# Patient Record
Sex: Female | Born: 1960 | Race: White | Hispanic: No | Marital: Single | Smoking: Current every day smoker
Health system: Southern US, Community
[De-identification: ages and names within clinical notes are randomized; demographics above are authoritative.]

## PROBLEM LIST (undated history)

## (undated) DIAGNOSIS — F32A Depression, unspecified: Secondary | ICD-10-CM

## (undated) DIAGNOSIS — M797 Fibromyalgia: Secondary | ICD-10-CM

## (undated) DIAGNOSIS — I10 Essential (primary) hypertension: Secondary | ICD-10-CM

## (undated) DIAGNOSIS — G4730 Sleep apnea, unspecified: Secondary | ICD-10-CM

## (undated) DIAGNOSIS — M549 Dorsalgia, unspecified: Secondary | ICD-10-CM

## (undated) DIAGNOSIS — K589 Irritable bowel syndrome without diarrhea: Secondary | ICD-10-CM

## (undated) DIAGNOSIS — E039 Hypothyroidism, unspecified: Secondary | ICD-10-CM

## (undated) DIAGNOSIS — M1990 Unspecified osteoarthritis, unspecified site: Secondary | ICD-10-CM

## (undated) DIAGNOSIS — F329 Major depressive disorder, single episode, unspecified: Secondary | ICD-10-CM

## (undated) DIAGNOSIS — E669 Obesity, unspecified: Secondary | ICD-10-CM

## (undated) DIAGNOSIS — G43909 Migraine, unspecified, not intractable, without status migrainosus: Secondary | ICD-10-CM

## (undated) DIAGNOSIS — K219 Gastro-esophageal reflux disease without esophagitis: Secondary | ICD-10-CM

## (undated) HISTORY — PX: BACK SURGERY: SHX140

## (undated) HISTORY — PX: NASAL SEPTUM SURGERY: SHX37

## (undated) HISTORY — DX: Obesity, unspecified: E66.9

## (undated) HISTORY — DX: Irritable bowel syndrome, unspecified: K58.9

## (undated) HISTORY — PX: NECK SURGERY: SHX720

## (undated) HISTORY — DX: Migraine, unspecified, not intractable, without status migrainosus: G43.909

## (undated) HISTORY — DX: Sleep apnea, unspecified: G47.30

## (undated) HISTORY — DX: Unspecified osteoarthritis, unspecified site: M19.90

## (undated) HISTORY — DX: Gastro-esophageal reflux disease without esophagitis: K21.9

## (undated) HISTORY — PX: TOTAL ABDOMINAL HYSTERECTOMY: SHX209

## (undated) HISTORY — DX: Fibromyalgia: M79.7

## (undated) HISTORY — DX: Dorsalgia, unspecified: M54.9

## (undated) HISTORY — PX: BREAST BIOPSY: SHX20

## (undated) HISTORY — PX: HERNIA REPAIR: SHX51

## (undated) HISTORY — DX: Hypothyroidism, unspecified: E03.9

## (undated) HISTORY — PX: CHOLECYSTECTOMY: SHX55

---

## 2006-10-15 ENCOUNTER — Ambulatory Visit: Payer: Self-pay | Admitting: Internal Medicine

## 2006-10-15 ENCOUNTER — Inpatient Hospital Stay (HOSPITAL_COMMUNITY): Admission: EM | Admit: 2006-10-15 | Discharge: 2006-10-29 | Payer: Self-pay | Admitting: Internal Medicine

## 2006-10-15 ENCOUNTER — Ambulatory Visit: Payer: Self-pay | Admitting: Cardiovascular Disease

## 2006-10-29 ENCOUNTER — Ambulatory Visit: Payer: Self-pay | Admitting: Gastroenterology

## 2006-11-22 ENCOUNTER — Inpatient Hospital Stay (HOSPITAL_COMMUNITY): Admission: EM | Admit: 2006-11-22 | Discharge: 2006-11-26 | Payer: Self-pay | Admitting: Emergency Medicine

## 2007-09-10 ENCOUNTER — Other Ambulatory Visit: Admission: RE | Admit: 2007-09-10 | Discharge: 2007-09-10 | Payer: Self-pay | Admitting: Orthopaedic Surgery

## 2007-09-12 IMAGING — CT CT ABDOMEN W/ CM
2 of 5 series · 16 of 46 positions shown, 18 images · IV contrast (APPLIED)
Comparison: 10/16/2006

ABDOMEN CT WITH CONTRAST:

CLINICAL DATA: Abdominal pain. Nausea vomiting and diarrhea. Gallbladder surgery
in August 2006.
TECHNIQUE: Multidetector CT imaging of the abdomen and pelvis was performed
following the standard protocol during bolus administration of intravenous
contrast.

Contrast:  100 cc Omnipaque 300

[Series 2: abd/pelv with 5.0 b31f st · axial · 0.92mm/px · z∈[-418,-38]mm · 13 of 86 slices shown, 15 images]
[im 5/86  soft-tissue]
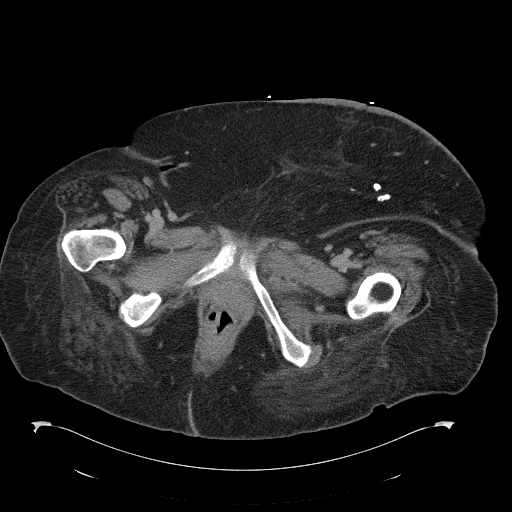
[im 5/86  bone]
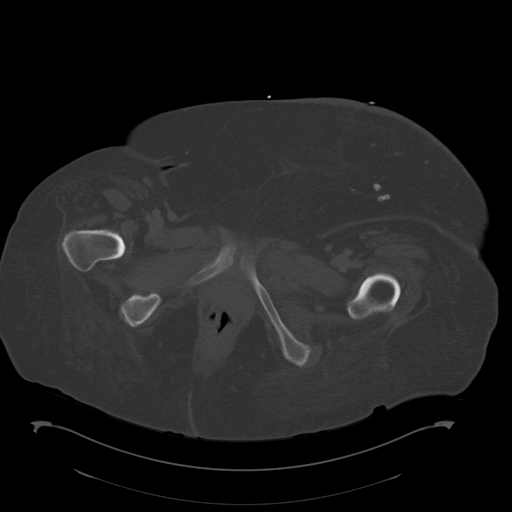
[im 10/86  soft-tissue]
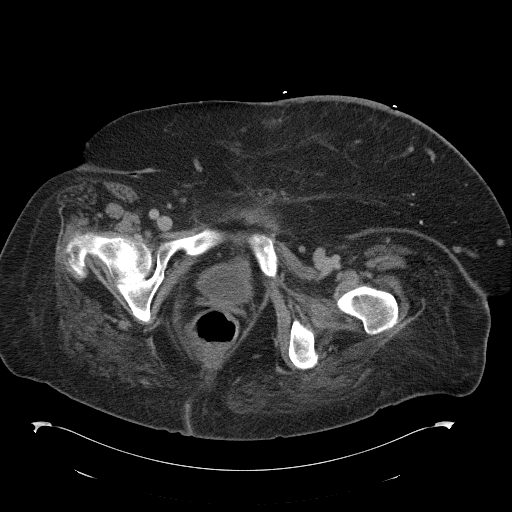
[im 19/86  soft-tissue]
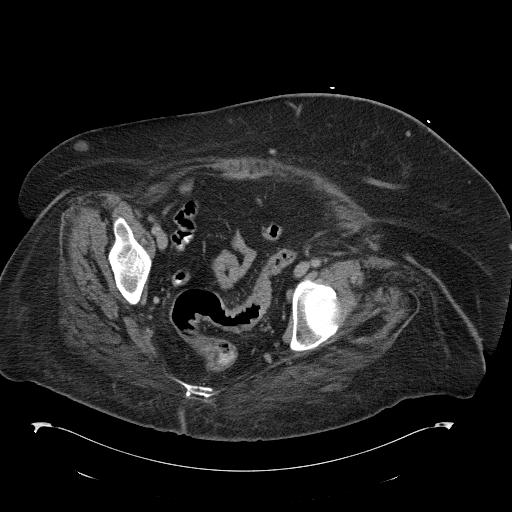
[im 24/86  soft-tissue]
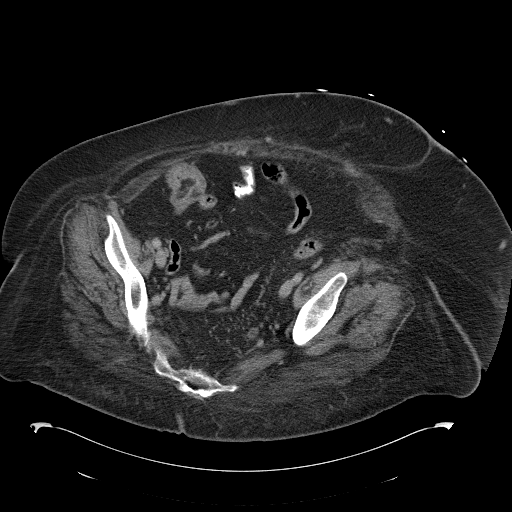
[im 29/86  soft-tissue]
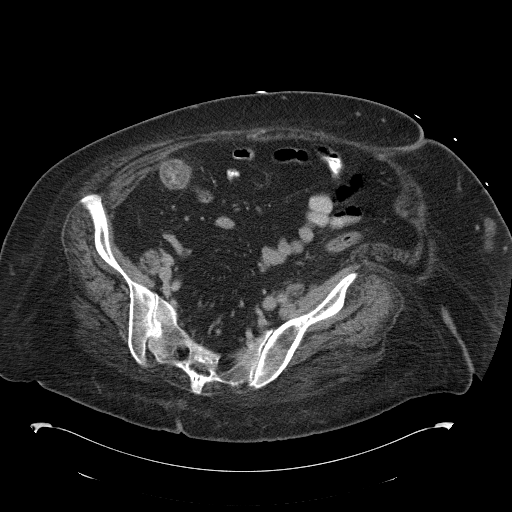
[im 38/86  soft-tissue]
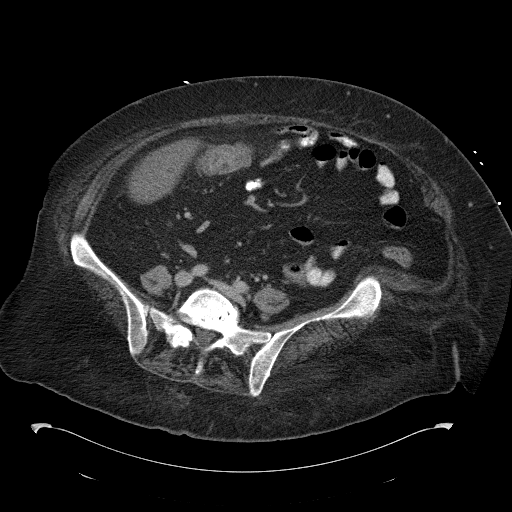
[im 43/86  soft-tissue]
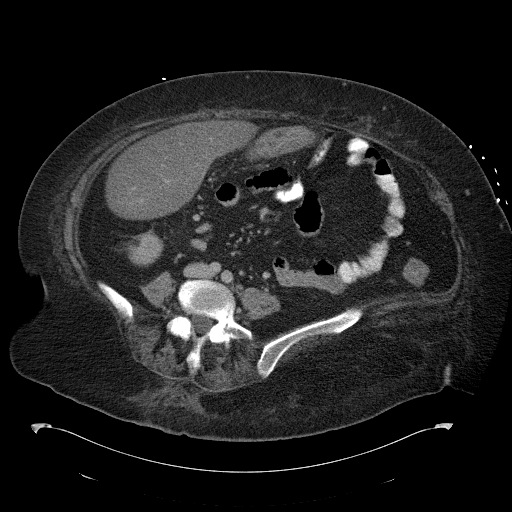
[im 48/86  soft-tissue]
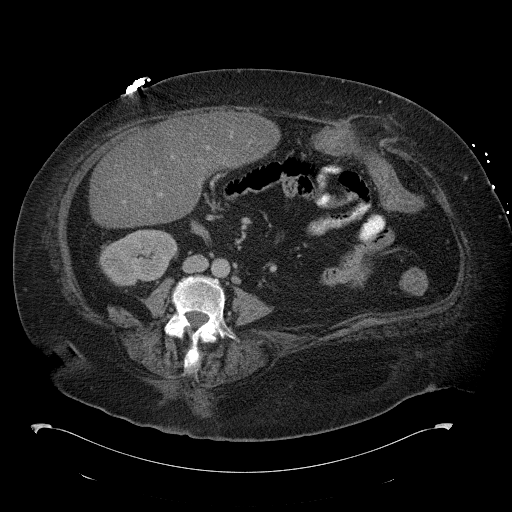
[im 57/86  soft-tissue]
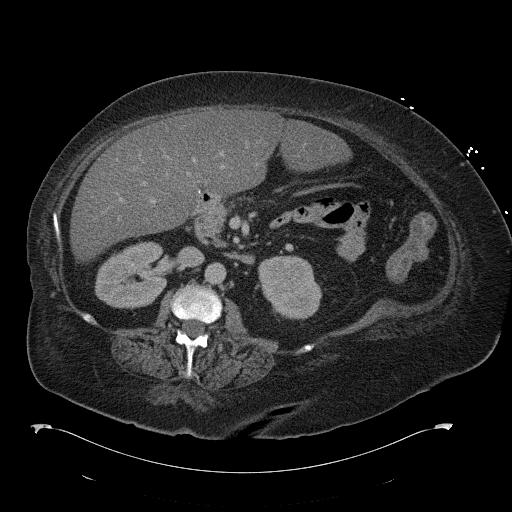
[im 57/86  bone]
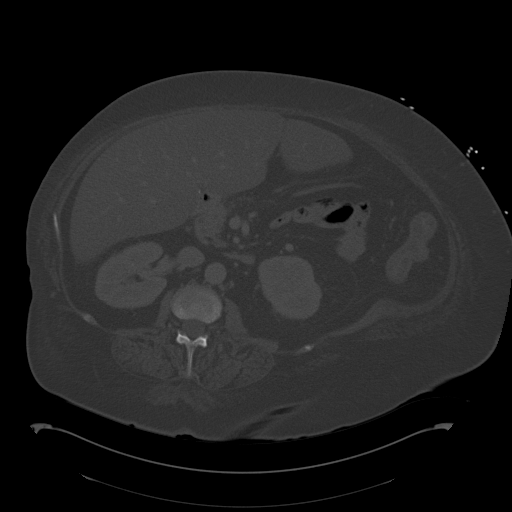
[im 62/86  soft-tissue]
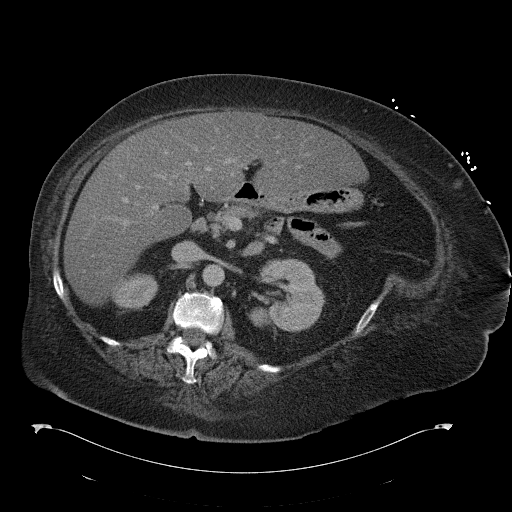
[im 67/86  soft-tissue]
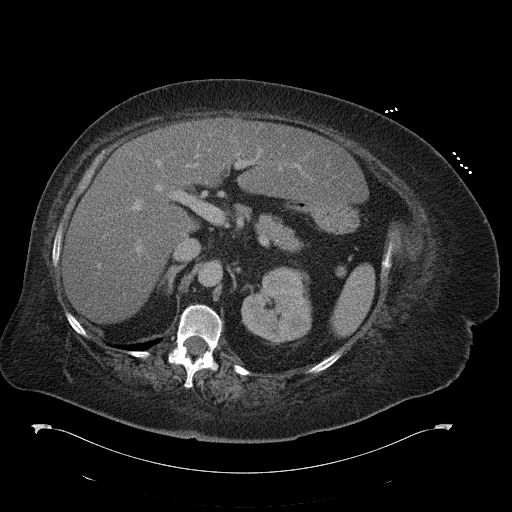
[im 76/86  soft-tissue]
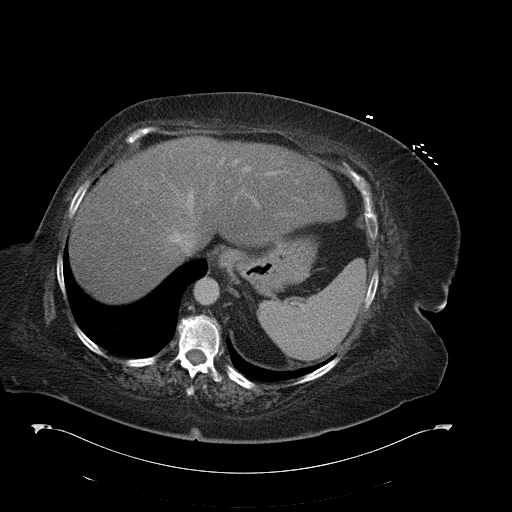
[im 81/86  soft-tissue]
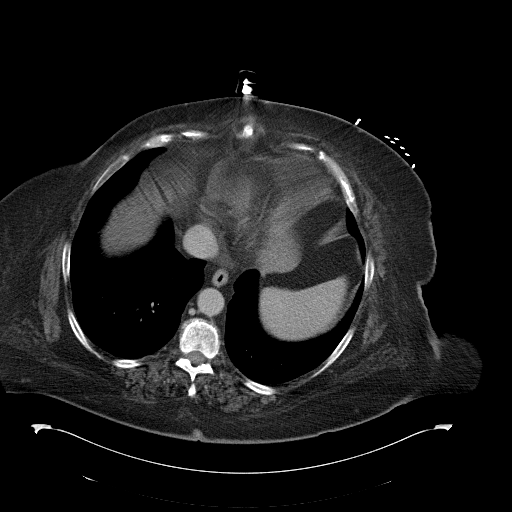

[Series 602: coronal a/p · coronal · 0.92mm/px · 3 of 176 slices shown]
[im 59/176  soft-tissue]
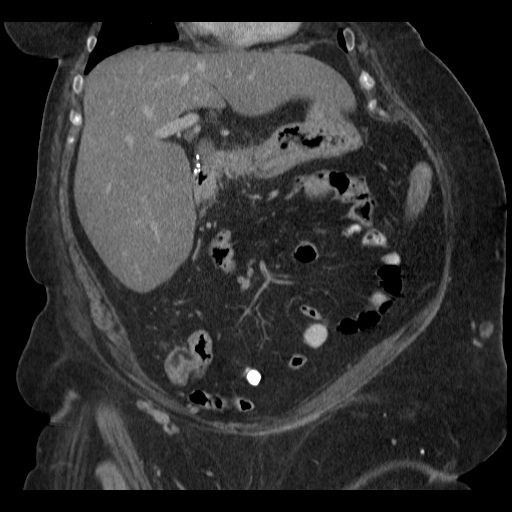
[im 78/176  soft-tissue]
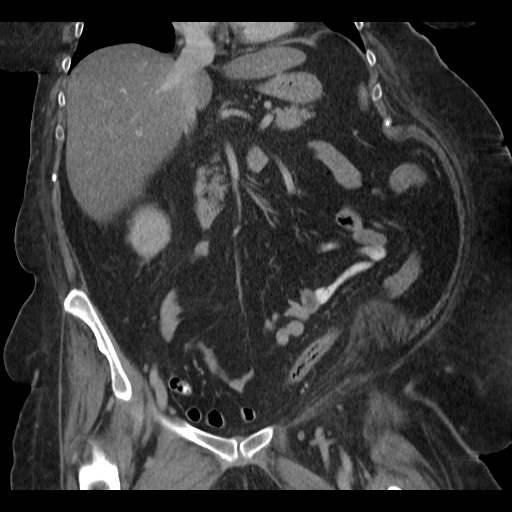
[im 98/176  soft-tissue]
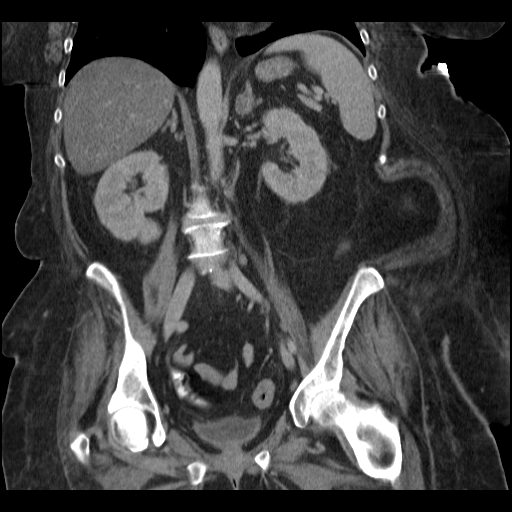

[16 of 46 positions shown; findings below may reference images not displayed]

FINDINGS: The liver is diffusely fatty infiltrated. The spleen, stomach,
duodenum, pancreas, and kidneys are unremarkable. Bilateral adrenal nodules are
stable in the interval. The left adrenal nodule has imaging features consistent
with a benign adrenal adenoma. The right adrenal nodule is quite small cannot be
further characterized, but is unchanged the interval. This is also most likely
an adenoma.

Ventral fascia is lax. A 3.7 cm defect is identified in the ventral fascia which
is at the midline based on the location of the umbilicus. The hernia contains
only omental fat without evidence for edema or fluid within the small hernia sac
to suggest infarction or incarceration. The transverse colon passes just deep to
the fascial defect, but does not extend into the hernia sac.

There is no bowel dilation suggest obstruction. There is no intraperitoneal free
fluid. No abdominal lymphadenopathy. No abdominal aortic aneurysm.
IMPRESSION: Hernia contains only omental fat without evidence for incarceration.

Stable bilateral adrenal nodules most consistent with benign adrenal adenomas.

Diffuse fatty infiltration of the liver.

PELVIS CT WITH CONTRAST:
FINDINGS: No intraperitoneal free fluid. The uterus is surgically absent.
Neither ovary is visualized and there is no evidence for an adnexal mass.

The colon is decompressed throughout, but does appear to have some wall
thickening extending from the cecum to the midsigmoid segment. Think there is
some subtle pericolonic edema/inflammation as well. The terminal ileum is
normal. The appendix has normal features.
IMPRESSION: Decompressed colon with diffuse wall thickening and mild pericolonic
edema/inflammation. Given the long segment involvement, an infectious or
inflammatory colitis is favored.

## 2009-10-04 ENCOUNTER — Inpatient Hospital Stay (HOSPITAL_COMMUNITY): Admission: RE | Admit: 2009-10-04 | Discharge: 2009-10-11 | Payer: Self-pay | Admitting: Neurosurgery

## 2010-06-18 LAB — BASIC METABOLIC PANEL
BUN: 11 mg/dL (ref 6–23)
Chloride: 110 mEq/L (ref 96–112)
Creatinine, Ser: 0.7 mg/dL (ref 0.4–1.2)
Glucose, Bld: 87 mg/dL (ref 70–99)

## 2010-06-18 LAB — GLUCOSE, CAPILLARY
Glucose-Capillary: 105 mg/dL — ABNORMAL HIGH (ref 70–99)
Glucose-Capillary: 106 mg/dL — ABNORMAL HIGH (ref 70–99)
Glucose-Capillary: 108 mg/dL — ABNORMAL HIGH (ref 70–99)
Glucose-Capillary: 109 mg/dL — ABNORMAL HIGH (ref 70–99)
Glucose-Capillary: 115 mg/dL — ABNORMAL HIGH (ref 70–99)
Glucose-Capillary: 121 mg/dL — ABNORMAL HIGH (ref 70–99)
Glucose-Capillary: 125 mg/dL — ABNORMAL HIGH (ref 70–99)
Glucose-Capillary: 126 mg/dL — ABNORMAL HIGH (ref 70–99)
Glucose-Capillary: 127 mg/dL — ABNORMAL HIGH (ref 70–99)
Glucose-Capillary: 144 mg/dL — ABNORMAL HIGH (ref 70–99)
Glucose-Capillary: 153 mg/dL — ABNORMAL HIGH (ref 70–99)
Glucose-Capillary: 178 mg/dL — ABNORMAL HIGH (ref 70–99)
Glucose-Capillary: 92 mg/dL (ref 70–99)

## 2010-06-18 LAB — CBC
MCH: 29.5 pg (ref 26.0–34.0)
MCHC: 33.4 g/dL (ref 30.0–36.0)
MCV: 88.6 fL (ref 78.0–100.0)
Platelets: 270 10*3/uL (ref 150–400)
RDW: 14.9 % (ref 11.5–15.5)
WBC: 11.1 10*3/uL — ABNORMAL HIGH (ref 4.0–10.5)

## 2010-06-18 LAB — APTT: aPTT: 33 seconds (ref 24–37)

## 2010-06-18 LAB — URINALYSIS, ROUTINE W REFLEX MICROSCOPIC
Bilirubin Urine: NEGATIVE
Glucose, UA: NEGATIVE mg/dL
Hgb urine dipstick: NEGATIVE
Ketones, ur: NEGATIVE mg/dL
pH: 7.5 (ref 5.0–8.0)

## 2010-06-18 LAB — SURGICAL PCR SCREEN: MRSA, PCR: NEGATIVE

## 2010-08-09 ENCOUNTER — Other Ambulatory Visit: Payer: Self-pay | Admitting: Medical

## 2010-08-15 NOTE — Discharge Summary (Signed)
[name], [name]               ACCOUNT NO.:  [id number]   MEDICAL RECORD NO.:  [id number]          PATIENT TYPE:  INP   LOCATION:  4743                         FACILITY:  [facility]   PHYSICIAN:  Madaline Savage, MD        DATE OF BIRTH:  1961/01/17   DATE OF ADMISSION:  11/21/2006  DATE OF DISCHARGE:  11/26/2006                               DISCHARGE SUMMARY   PRIMARY CARE PHYSICIAN:  Dr. Wyvonnia Lora, [place], [place].  This  patient is unassigned to [place].   DISCHARGE DIAGNOSES:  1. Colitis, likely infectious colitis, which has resolved.  2. Citrobacter urinary tract infection.  3. Chronic pain syndrome.  4. History of obstructive sleep apnea.  5. Depression.  6. Hypothyroidism.  7. Morbid obesity.  8. Anxiety disorder.   DISCHARGE MEDICATIONS:  1. Ciprofloxacin 500 mg twice daily for 9 more days.  2. Flagyl 500 mg three times daily for 9 more days.  3. Percocet 5/325 one tablet three times daily as needed.  4. Phenergan 25 mg three times daily as needed.  5. Synthroid 175 mcg once daily.  6. Citalopram 20 mcg once daily.  7. Flora-Q one tablet daily.  8. Zolpidem 5 mg at bedtime.  9. Insulin, sliding scale as before.   HISTORY OF PRESENT ILLNESS:  For full history and physical, please see  the history and physical dictated by Dr. Eda Paschal on November 22, 2006.  In  short, [name] is a 50 year old lady with a history of chronic pain  syndrome, sleep apnea, depression, hypothyroidism who was admitted with  intractable nausea and vomiting.  She was recently admitted with a  diagnosis of small bowel obstruction and non-incarcerated ventral hernia  with no surgical intervention needed and at that time her symptoms had  resolved with medical treatment.  She has now come back with complaints  of nausea and abdominal pain and a CT scan obtained in the emergency  department showed colitis and she was admitted for further evaluation  and management.   PROCEDURES DONE IN THE  HOSPITAL:  1. She had a CT scan of abdomen and pelvis done on November 22, 2006      which showed decompressed colon with a diffuse wall thickening and      a mild pericolonic edema/inflammation.  Given the long segment      involvement an infectious or inflammatory colitis is favored.  It      also showed a ventral hernia which contains only omental fat      without evidence of incarceration.  2. She had an acute abdominal series x-ray done on November 21, 2006      which showed slightly prominent left abdominal wall small bowel      loops which are less prominent than on prior study.   PROBLEM LIST:  1. Colitis, likely infectious colitis.  This is a 50 year old lady who      had laparoscopic cholecystectomy recently after which she was      admitted to the hospital for small bowel obstruction.  She has now  come back with abdominal pain and her CT scan shows colitis and the      CT scan features suggested infectious versus inflammatory colitis.      She did not have any blood in her stools.  She had diarrhea, nausea      and vomiting.  She was started empirically on Rocephin and Flagyl      while in the hospital with good improvement in the next 48 hours.      At this time her abdominal pain has completely resolved.  Her      nausea is well controlled on Phenergan and she feels that she will      do well if she goes home.  I suspect this is an infectious colitis,      since she responded very well to antibiotics.  She will also need a      colonoscopy as an outpatient once she stabilizes.  I have asked her      to follow up with her primary care doctor in one week, who can      arrange for that.  The plan would be to continue her with      ciprofloxacin and Flagyl for a total of two weeks of antibiotics.  2. Citrobacter urinary tract infection.  After admission she was found      to be having a urinary tract infection with Citrobacter.  The      cultures grew greater than 100,000  colonies.  This is sensitive to      Rocephin and ciprofloxacin.  I will plan to treat her with      ciprofloxacin for a total of two weeks.   DISPOSITION:  She is now being discharged home in a stable condition.  She is asked to complete her two week course of antibiotics.  She is  also asked to follow up with her primary care doctor in one week.  I  would recommend that she be arranged for an outpatient colonoscopy  once  she stabilizes.   FOLLOWUP:  She is asked to follow up with her primary care doctor, Dr.  Margo Common, in one week.      Madaline Savage, MD  Electronically Signed     PKN/MEDQ  D:  11/26/2006  T:  11/26/2006  Job:  [id number]   cc:   Wyvonnia Lora

## 2010-08-15 NOTE — H&P (Signed)
[name], [name]               ACCOUNT NO.:  [id number]   MEDICAL RECORD NO.:  [id number]          PATIENT TYPE:  INP   LOCATION:  4743                         FACILITY:  [facility]   PHYSICIAN:  Hind I Elsaid, MD      DATE OF BIRTH:  1960-09-13   DATE OF ADMISSION:  11/22/2006  DATE OF DISCHARGE:                              HISTORY & PHYSICAL   PRIMARY CARE PHYSICIAN:  Unassigned to [place].   CHIEF COMPLAINT:  Intractable nausea and vomiting.   HISTORY OF PRESENT ILLNESS:  This is a 50 year old female who has a  recent admission to [facility] with a diagnosis of small-bowel  obstruction, which has resolved medically.  History of recent  laparoscopic cholecystectomy approximately around 2-3 months ago where  it seems like the patient with a history of diarrhea.  History of  nonincarcerated ventral hernia.  The patient was recently discharged  from the hospital with a diagnosis of small-bowel obstruction and  nonincarcerated ventral hernia with no surgical intervention needed.  The patient, today, with a history of intractable nausea and vomiting  since the date of discharge.  The patient admitted she continued to have  complaints of nausea and vomiting of whitish-to-yellow fluid which  contained food particles, which is progressively getting worse and  reached to the point where the patient cannot keep any food in.  Condition also associated with 60-pound weight loss since the date of  the surgery.  Condition also associated with mid abdominal pain, mainly  around the paraumbilicus, around 8 in severity, localized.  Not  associated with fever but the condition associated with diarrhea.  The  patient admitted those symptoms all started after the lap  cholecystectomy.  The patient admitted to diarrhea almost 4-10 times a  day, watery in nature, was not associated with blood.  According to the  previous history, the patient was recently diagnosed on July 28 with a  diagnosis of  small-bowel obstruction, which is resolved medically and  diarrhea which has remained chronically.  The patient denies any  headache, denies any shortness of breath, denies any chest pain, denies  any numbness or weakness of extremities.   PAST MEDICAL HISTORY:  1. Status post small-bowel obstruction, resolved medically.  2. Shock due to systemic inflammatory response syndrome.  3. Lap cholecystectomy approximately 3 months ago for diarrhea, which      seems chronic.  4. History of nonincarcerated ventral hernia.  5. Obstructive sleep apnea with obesity hypoventilation syndrome.  6. Chronic pain.  7. Depression and debilitation.  8. Hypothyroidism.  9. Morbid obesity.  10.Anxiety disorder.   PAST SURGICAL HISTORY:  1. Lap cholecystectomy as above.  2. Hysterectomy.  3. Tonsillectomy.  4. Nose reconstruction.  5. As was mentioned, gallbladder and appendectomy.  6. History of herniated disk.   ALLERGIES:  CLINDAMYCIN, NAPROSYN, AND NONSTEROIDAL ANTI-INFLAMMATORY  DRUGS.   MEDICATIONS:  1. Synthroid 175 mcg p.o. daily.  2. Citalopram 20 mg p.o. daily.  3. Flora-Q1 tab p.o. daily.  4. Zolpidem 5 mg p.o. daily.  5. Insulin sliding scale.   SOCIAL HISTORY:  She  lives with her mother, single, no children.  No  smoking and no alcoholism.   PHYSICAL EXAMINATION:  GENERAL:  The patient was crying in the middle of  the conversation, and she is kind of concerned about her consistent  nausea and vomiting.  She is anxious, crying, obese lady.  VITAL SIGNS:  Temperature 97.9, blood pressure 131/76, pulse rate 95,  respiratory rate 20, saturation 97% on room air.  HEENT:  Normocephalic, atraumatic.  Extraocular muscle movements normal.  Pupils equal, reactive to light and accommodation.  NECK:  Has double chin.  Neck is really short.  Cannot evaluate JVD.  LUNGS:  Clear to auscultation.  HEART:  Regular rate and rhythm.  ABDOMEN:  Very obese.  Laparoscopic cholecystectomy incision  has healed.  Difficult to examine any palpable masses.  No evidence of hernia.  There  is tenderness mainly at the paraumbilical area, where there is evidence  of some surgical scars.  There is no evidence of rebound tenderness or  guarding.  Bowel sounds were positive.  EXTREMITIES:  Some stasis dermatitis in her bilateral ankles.   X-ray which shows slightly prominent left abdominal small-bowel loop  found on prior surgery 1 month ago.  No free air.  Diffuse interstitial  prominence compatible with chronic interstitial lung disease.  Lipase  131.  CMP:  Sodium 139, potassium 2.8, chloride 106, CO2 of 25, glucose  95, BUN is 2, and creatinine 0.79, bilirubin 0.07, alkaline phosphatase  83, AST 31, ALT 16, total protein 6.4, albumin 2.6, and calcium 8.5.  Urinalysis:  White blood cells 11-20, RBCs 3-6, small leukocytes, and BA  6.5, and small bilirubin, ketones 15.  CBC:  Hemoglobin 12.2, hematocrit  36.5, platelets 522, and there is some neutrophil percent 58, lymphocyte  absolute 4.3 which is kind of mildly elevated.   ASSESSMENT AND PLAN:  1. Intractable nausea and vomiting in a lady with a history of      nonincarcerated ventral hernia.  Abdominal examination:  Some      tenderness around the paraumbilicus area.  Cannot exclude      incarcerated hernia.  Cannot exclude pancreatic stone or biliary      stone, although the liver function test is within normal, but      amylase is mildly elevated.  We will keep the patient n.p.o. at      this time.  We will get CT abdomen and pelvis with IV and p.o.      contrast for evaluation of incarcerated hernia.  We will consider      consulting surgery if there is any abnormality with CT scan.  We      will continue with Protonix IV at this time and placement of      nasogastric tube, placement of nasogastric tube depending on the CT      scan findings if there is any evidence of ileus.  We will repeat      amylase and lipase for further  evaluation.  2. Hyperkalemia secondary to excessive vomiting, which could      exacerbate chronic ileus or small-bowel obstruction.  We will plan      to correct potassium within the next 24 hours, most probably      hyperkalemia secondary to excessive vomiting.  3. Diarrhea:  Cause is really unclear.  We will get a Clostridium      difficile and white blood cells and ova and parasite.  Possibility      of malabsorption  secondary to removal of the gallbladder.  We will      consider consulting gastroenterology if needed.  We will consider      Imodium for treatment of diarrhea if above negative.  4. Hypothyroidism:  We will continue with Synthroid.  5. Depression:  To continue with citalopram, which has remained      stable.  6. Deep venous thrombosis and gastrointestinal prophylaxis.      Hind Bosie Helper, MD  Electronically Signed     HIE/MEDQ  D:  11/22/2006  T:  11/22/2006  Job:  [id number]

## 2010-08-15 NOTE — Discharge Summary (Signed)
NAMEREISE, [name]               ACCOUNT NO.:  [id number]   MEDICAL RECORD NO.:  [id number]          PATIENT TYPE:  INP   LOCATION:  5527                         FACILITY:  [facility]   PHYSICIAN:  Nelda Bucks, MD DATE OF BIRTH:  [birth year]   DATE OF ADMISSION:  10/15/2006  DATE OF DISCHARGE:  10/29/2006                               DISCHARGE SUMMARY   DATE OF ANTICIPATED DISCHARGE:  10/29/2006.   DISCHARGE DIAGNOSES:  1. Status post small bowel obstruction (resolved with medical      management)  2. Shock, systemic inflammatory response syndrome (resolved)  3. Recent laparoscopic cholecystectomy approximately 6 weeks ago  4. Diarrhea  5. Acute renal failure (resolved)]  6. Non-incarcerated ventral hernia  7. Probable obstructive sleep apnea with obesity-hypoventilation      syndrome  8. Chronic pain  9. Depression  10.Debility   CONSULTATIONS:  1. Dr. Violeta Gelinas, general surgery  2. Dr. Melvia Heaps, [place] [facility]   PROCEDURES:  1. Flexible sigmoidoscopy obtained on 10/24/2006 that demonstrated      normal descending colon and rectal exam.  2. Right internal jugular vein central venous catheter placed      10/15/2006, removed 10/27/2006.  3. Radial A-line placed 10/16/2006, removed 10/17/2006 on the right.  4. Nasogastric tube placed 10/16/2006, removed 10/20/2006   LABORATORY DATA:  10/28/2006:  Sodium 143, potassium 3.8, chloride 113,  CO2 23, glucose 70, BUN 3, creatinine 1.26, calcium 8.2.  10/26/2006:  White blood cell count 11.2, hemoglobin 8.4, hematocrit 25, platelet  count 455.   RADIOLOGY:  1. Flat plate of abdomen obtained on 10/22/2006 demonstrating mildly      distended loops of the small bowel.  2. CT abdomen and pelvis obtained on 10/16/2006 demonstrating findings      consistent with partial small bowel obstruction.   HOSPITAL COURSE BY DISCHARGE DIAGNOSIS:  1. Status post small bowel obstruction.  Ms. [name] is a 50 year old      white  female whom the critical care team accepted from [facility], who apparently had laparoscopic cholecystectomy      approximately 2 weeks prior to admission at [facility].  She      had had intermittent nausea and vomiting on and off over the weeks      following.  CT abdomen and pelvis demonstrated concern for early      bowel obstruction.  She was sent to [facility] for      potential laparoscopic repair with the surgical service team.  She      developed progressive renal failure and hypotension, as well as      acute delirium.  The pulmonary critical care service admitted as      primary care providers.  Surgical consultation was obtained by Dr.      Violeta Gelinas.  Recommendations were to place NG tube, provide      empiric antibiotics, and provide bowel rest.  This was done.  There      was no evidence of perforation or peritonitis.  [name] was able  to successful resolve signs and symptoms of small bowel obstruction      without surgical intervention, and upon time of discharge is      currently able to tolerate oral diet without difficulty.  She does      complain of intermittent episodes of nausea still at time of      discharge.  2. Shock, systemic inflammatory response syndrome.  This is resolved.      This is thought to be both to some degree a component of      hypovolemia, as well as perhaps an inflammatory response to the      bowel obstruction.  She was treated aggressively with IV fluids.      She did require Levophed for maintenance of mean arterial pressure.      She did have resultant acute renal failure from this.  This has      since resolved with aggressive critical care services shortly after      critical care admission time.  Upon time of discharge, [name] is      hemodynamically stable, without evidence of multiple organ      dysfunction.  3. Status post recent lap chole.  This was completed over 2 months ago      at  [facility].  There have been no sequelae from this.  She will      require surgical followup at some point in the future and perhaps      followup with gastroenterology at the discretion of the accepting      primary care [place].  4. Diarrhea.  This occurred approximately 5 days prior to discharge.      There was some concern as to whether or not this could represent      ischemia given transient episode of hypotension prior to this.      Because of this, gastroenterology was consulted.  She underwent      flexible sigmoidoscopy which demonstrated normal findings of the      colon and the rectum.  She was started on both Questran and      Florastor.  Currently on time of discharge and for the last 72      hours, she has had no more episodes of diarrhea.  The Questran will      be discontinued.  She will continue Florastor on a regular      scheduled dose, with followup as indicated.  5. Acute renal failure.  This resolved with aggressive IV fluid      resuscitation.  Her creatinine was as high as 5.5.  Upon time of      discharge it is currently 1.26.  This is slightly elevated from lab      obtained on 10/26/2006 for which her creatinine was down to 1.15.      This is following discontinuation of IV fluids.  She has been      instructed to increase oral fluid intake.  She will require      occasional laboratory followup of renal function.  6. Non-incarcerated ventral hernia.  This will require observation in      the outpatient setting.  7. Probable obstructive sleep apnea and obesity-hypoventilation      syndrome.  [name] is morbidly obese.  She clearly has the      anatomy for obstructive sleep apnea.  It would be an important      routine health maintenance recommendation for elective  polysomnogram evaluation for sleep apnea in the outpatient setting.  8. Chronic pain.  Plan for this is to continue current regimen as to      be outlined.  9. Depression.  Plan for this is  to treat medically, could consider      psychiatric evaluation in the outpatient setting if indicated.  10.Anemia of chronic disease.  Currently no signs or symptoms of      bleeding.  Her hemoglobin has been stable in the 8.2 to 8.1 range.      She was found to be normocytic and normochromic on differential      count.   DISCHARGE MEDICATIONS:  1. Celexa 20 mg p.o. daily  2. Lovenox 85 mg subcutaneous daily  3. Duragesic patch 50 mcg patch q.72h  4. Sliding scale insulin for blood glucose 201-250 equals 2 units, for      251-300 equals 3 units, from 301 to 350 equals 4 units, greater      than 350 equals 5 units and call physician.  (this for her h.s. insulin needs).  1. Sliding scale insulin NovoLog before meals for blood glucose 101-      150 equals 2 units, for 151-200 equals 3 units, 201-250 equals 5      units, 251-300 equals 7 units, 301 to 350 equals 9 units, greater      than 350 equals 11 units and call physician.  2. Synthroid 175 mcg p.o. daily  3. Protonix 40 mg p.o. daily  4. Florastor 250 mg cap 1 p.o. daily  5. Percocet 2 tabs q.6h p.r.n. pain  6. Promethazine 12.5 mg p.o. q.6h nausea   DIET:  As tolerated.  Currently on regular diet.   ACTIVITY:  Increase as tolerated.   DISPOSITION:  Ms. Schnieders currently still marked debility and weakness  following prolonged acute illness.  She will require rehabilitation  services in the skilled nursing home setting in order to regain  functional independence.  She is medically cleared for discharge to  skilled nursing facility setting with outpatient followup.      Zenia Resides, NP      Nelda Bucks, MD  Electronically Signed    PB/MEDQ  D:  10/28/2006  T:  10/28/2006  Job:  [id number]

## 2010-08-15 NOTE — Consult Note (Signed)
[name], [name]               ACCOUNT NO.:  [id number]   MEDICAL RECORD NO.:  [id number]          PATIENT TYPE:  INP   LOCATION:  2110                         FACILITY:  [facility]   PHYSICIAN:  Gabrielle Dare. Janee Morn, M.D.DATE OF BIRTH:  1960/07/19   DATE OF CONSULTATION:  DATE OF DISCHARGE:                                 CONSULTATION   REPORT TYPE:  Surgical consultation.   CHIEF COMPLAINT:  Of possible bowel obstruction from hernia.   HISTORY OF PRESENT ILLNESS:  I was asked to evaluate this 50 year old  white female by Dr. Tyson Alias from critical care medicine.  She was  accepted in transfer from [facility].  The patient  apparently had a laparoscopic cholecystectomy 2 months ago at [facility].  She has had nausea and vomiting on and off over the weeks following.  The patient was admitted to a week or 2 ago for urinary tract infection.  She developed a recurrence of the nausea and vomiting and was readmitted  to [facility] on 07/14 for further evaluation.  CT scan of the  abdomen and pelvis apparently at that time according to her report shows  a left-sided spigelian hernia with possible early small bowel  obstruction.  No signs of bowel compromise are noted on the report.  Surgical consult was obtained there and recommendation was made for  transfer to a Medical Center for laparoscopic repair of this hernia.  In  the interim the patient has worsened clinically and developed acute  renal failure.  She was transferred here.  The patient is confused.  She  is unable to assist much with her history   PAST MEDICAL HISTORY:  Morbid obesity, anxiety disorder, asthma,  hypothyroidism and a recent admission for urinary tract infection.   PAST SURGICAL HISTORY:  Laparoscopic cholecystectomy as above, otherwise  unknown.   ALLERGIES:  CLINDAMYCIN, NAPROSYN and NSAIDS.   MEDICATIONS:  Please see the MARCH.   REVIEW OF SYSTEMS:  She is not cooperative with review of  systems but  does complain of some generalized body pain, nausea and vomiting.   PHYSICAL EXAMINATION:  On physical exam temperature 98.3, blood pressure  135/59, heart rate  127, respirations 18, saturations 98%  GENERAL:  She is anxious.  She does answer some questions.  NECK is supple.  LUNGS are clear to auscultation.  HEART is tachycardiac but regular.  ABDOMEN is very obese.  Laparoscopic cholecystectomy incisions are  healed.  She has no obvious palpable mass or hernia.  She does have some  diffuse mild tenderness with no peritonitis.  The patient's extreme  obesity significantly clouds her examination.  She has some skin  breakdown under her pannus.  EXTREMITIES:  Have some stasis changes in bilateral ankles.   DATA REVIEWED:  Abdominal x-ray shows no obvious obstruction, but it is  very poor quality secondary to her size.  White blood cell count 13,000,  hemoglobin 9, BUN 36, creatinine 5.6, glucose 79, lactate 1.6.   IMPRESSION:  1. Possible small bowel obstruction secondary to spigelian hernia.  2. Acute renal failure.  3. Lactate within normal limits  with acidosis likely secondary to #2.   RECOMMENDATIONS:  NG tube, IV antibiotics and a repeat of her CT scan of  the abdomen and pelvis to further evaluate.  Again, she has no  peritonitis on her examination.  I discussed this in full with Dr. Clelia Croft,  the resident on the [facility]  service will follow closely.      Gabrielle Dare Janee Morn, M.D.  Electronically Signed     BET/MEDQ  D:  10/16/2006  T:  10/16/2006  Job:  [id number]   cc:   Nelda Bucks, MD

## 2010-08-29 ENCOUNTER — Encounter: Payer: Self-pay | Admitting: Cardiology

## 2010-08-29 DIAGNOSIS — R072 Precordial pain: Secondary | ICD-10-CM

## 2011-01-02 ENCOUNTER — Other Ambulatory Visit (INDEPENDENT_AMBULATORY_CARE_PROVIDER_SITE_OTHER): Payer: Self-pay | Admitting: Internal Medicine

## 2011-01-02 DIAGNOSIS — K219 Gastro-esophageal reflux disease without esophagitis: Secondary | ICD-10-CM

## 2011-01-04 ENCOUNTER — Ambulatory Visit (INDEPENDENT_AMBULATORY_CARE_PROVIDER_SITE_OTHER): Payer: Medicare Other | Admitting: Internal Medicine

## 2011-01-04 ENCOUNTER — Encounter (INDEPENDENT_AMBULATORY_CARE_PROVIDER_SITE_OTHER): Payer: Self-pay | Admitting: Internal Medicine

## 2011-01-04 VITALS — BP 132/84 | HR 66 | Temp 97.3°F | Ht 64.0 in | Wt 207.5 lb

## 2011-01-04 DIAGNOSIS — R1314 Dysphagia, pharyngoesophageal phase: Secondary | ICD-10-CM

## 2011-01-04 DIAGNOSIS — R1310 Dysphagia, unspecified: Secondary | ICD-10-CM

## 2011-01-04 NOTE — Progress Notes (Signed)
Subjective:     Patient ID: [name], female   DOB: January 01, 1961, 50 y.o.   MRN: [id number]  HPI   [name] presents today with c/o dysphagia.  She says foods are lodging. Foods are lodging in her upper esophagus.  She says water will even lodge in her esophagus. She has symptoms for about a year. She says this has progressively worsened.  She also c/o epigastric pain.  She is eating one meal a day. She has had about a 40 pound weight loss which has been intentional.  She is trying to exercise daily on a stationary back.  She is morbidly obese.  She c/o nausea today. She is having a BM one a day.      EGDED 12/2006 non erosive gastritis. Duodenal biopsy was negative for celiac. Colonoscopy in 2008 was normal Review of Systems  See hpi Current Outpatient Prescriptions  Medication Sig Dispense Refill  . beta carotene w/minerals (OCUVITE) tablet Take 1 tablet by mouth daily.        . fentaNYL (DURAGESIC - DOSED MCG/HR) 100 MCG/HR Place 1 patch onto the skin every 3 (three) days.        . furosemide (LASIX) 40 MG tablet Take 40 mg by mouth 3 (three) times daily.        . hydrocodone-acetaminophen (LORCET PLUS) 7.5-650 MG per tablet Take 1 tablet by mouth every 6 (six) hours as needed.        Marland Kitchen levothyroxine (SYNTHROID, LEVOTHROID) 175 MCG tablet Take 175 mcg by mouth daily.        . metoprolol tartrate (LOPRESSOR) 25 MG tablet Take 25 mg by mouth daily.        . Milnacipran (SAVELLA) 50 MG TABS Take 50 mg by mouth 2 (two) times daily.        Marland Kitchen NEXIUM 40 MG capsule TAKE 1 CAPSULE BY MOUTH EVERY DAY  30 capsule  11  . polyethylene glycol (MIRALAX / GLYCOLAX) packet Take 17 g by mouth 2 (two) times daily before a meal.        . pregabalin (LYRICA) 75 MG capsule Take 75 mg by mouth 3 (three) times daily.        Marland Kitchen tiZANidine (ZANAFLEX) 4 MG capsule Take 4 mg by mouth 3 (three) times daily.        Marland Kitchen topiramate (TOPAMAX) 100 MG tablet Take 100 mg by mouth 2 (two) times daily.        . traMADol (ULTRAM)  50 MG tablet Take 50 mg by mouth 3 (three) times daily.        Marland Kitchen zolpidem (AMBIEN) 10 MG tablet Take 10 mg by mouth at bedtime as needed.         Past Surgical History  Procedure Date  . Cholecystectomy   . Hernia repair   . Breast biopsy   . Total abdominal hysterectomy     Left oophroectomy for a lare tumor about 21 yrs. RT ovary removed time of hysterectomy      Past Medical History  Diagnosis Date  . IBS (irritable bowel syndrome)   . GERD (gastroesophageal reflux disease)   . Migraines   . Sleep apnea   . Back pain   . Hypothyroid   . Osteoarthritis   . Diabetes mellitus   . Obesity    History   Social History Narrative  . No narrative on file   Allergies  Allergen Reactions  . Keflex  Objective:   Physical Exam  Filed Vitals:   01/04/11 1102  BP: 132/84  Pulse: 66  Temp: 97.3 F (36.3 C)  Height: 5\' 4"  (1.626 m)  Weight: 207 lb 8 oz (94.121 kg)    Alert and oriented. Skin warm and dry. Oral mucosa is moist. Edentulous. . Sclera anicteric, conjunctivae is pink. Thyroid not enlarged. No cervical lymphadenopathy. Lungs clear. Heart regular rate and rhythm.  Abdomen is soft. Bowel sounds are positive. obese No hepatomegaly. No abdominal masses felt. No tenderness   Edema to lower extremities.  Compression hose in place. Patient is alert and oriented.     Assessment:   Dysphagia    Plan:    Barium pill study. Further recommendations once we have the study back. Possible EGD/ED if abnormal. I will discuss this with Dr. Karilyn Cota.  She states she is satisfied with her care today.

## 2011-01-09 ENCOUNTER — Telehealth (INDEPENDENT_AMBULATORY_CARE_PROVIDER_SITE_OTHER): Payer: Self-pay | Admitting: Internal Medicine

## 2011-01-09 ENCOUNTER — Encounter (INDEPENDENT_AMBULATORY_CARE_PROVIDER_SITE_OTHER): Payer: Self-pay | Admitting: *Deleted

## 2011-01-09 NOTE — Telephone Encounter (Signed)
I spoke with patient. She is still having dysphagia. She will need an EGD/ED

## 2011-01-10 ENCOUNTER — Other Ambulatory Visit (INDEPENDENT_AMBULATORY_CARE_PROVIDER_SITE_OTHER): Payer: Self-pay | Admitting: *Deleted

## 2011-01-10 ENCOUNTER — Encounter (INDEPENDENT_AMBULATORY_CARE_PROVIDER_SITE_OTHER): Payer: Self-pay | Admitting: *Deleted

## 2011-01-10 NOTE — Telephone Encounter (Signed)
Egd/ed sch'd 02/28/11, patient aware, instructions mailed

## 2011-01-12 LAB — BASIC METABOLIC PANEL
BUN: 1 — ABNORMAL LOW
BUN: <1 — ABNORMAL LOW
CO2: 24
CO2: 24
CO2: 25
CO2: 28
Calcium: 7.9 — ABNORMAL LOW
Calcium: 8.3 — ABNORMAL LOW
Chloride: 112
Chloride: 113 — ABNORMAL HIGH
Creatinine, Ser: 0.67
Creatinine, Ser: 0.71
GFR calc Af Amer: >60
GFR calc Af Amer: >60
GFR calc non Af Amer: >60
GFR calc non Af Amer: >60
Glucose, Bld: 67 — ABNORMAL LOW
Glucose, Bld: 79
Glucose, Bld: 85
Potassium: 2.9 — ABNORMAL LOW
Potassium: 3.4 — ABNORMAL LOW
Potassium: 4.1
Sodium: 143
Sodium: 145

## 2011-01-12 LAB — COMPREHENSIVE METABOLIC PANEL
ALT: 16
Alkaline Phosphatase: 83
BUN: 2 — ABNORMAL LOW
CO2: 25
GFR calc non Af Amer: >60
Glucose, Bld: 92
Potassium: 2.8 — ABNORMAL LOW
Sodium: 139
Total Bilirubin: 0.7

## 2011-01-12 LAB — CLOSTRIDIUM DIFFICILE EIA: C difficile Toxins A+B, EIA: NEGATIVE

## 2011-01-12 LAB — HEMOGLOBIN A1C: Hgb A1c MFr Bld: 4.4 — ABNORMAL LOW

## 2011-01-12 LAB — CBC
HCT: 31 — ABNORMAL LOW
HCT: 32.7 — ABNORMAL LOW
HCT: 36.5
Hemoglobin: 10.8 — ABNORMAL LOW
MCHC: 33.2
MCHC: 33.4
MCV: 94.5
MCV: 95.2
Platelets: 406 — ABNORMAL HIGH
Platelets: 522 — ABNORMAL HIGH
RBC: 3.32 — ABNORMAL LOW
RBC: 3.44 — ABNORMAL LOW
RDW: 15 — ABNORMAL HIGH
WBC: 8.5

## 2011-01-12 LAB — DIFFERENTIAL
Basophils Absolute: 0.1
Basophils Relative: 1
Basophils Relative: 1
Eosinophils Absolute: 0.2
Eosinophils Absolute: 0.4
Eosinophils Relative: 6 — ABNORMAL HIGH
Lymphs Abs: 2.5
Monocytes Absolute: 0.4
Monocytes Relative: 5
Neutro Abs: 7.1
Neutrophils Relative %: 58

## 2011-01-12 LAB — URINALYSIS, ROUTINE W REFLEX MICROSCOPIC
Glucose, UA: NEGATIVE
Ketones, ur: 15 — AB
Nitrite: NEGATIVE
Protein, ur: 100 — AB
Urobilinogen, UA: 0.2

## 2011-01-12 LAB — CARDIAC PANEL(CRET KIN+CKTOT+MB+TROPI)
CK, MB: 0.9
CK, MB: 1.1
Relative Index: INVALID
Total CK: 41
Troponin I: 0.01

## 2011-01-12 LAB — URINE MICROSCOPIC-ADD ON

## 2011-01-12 LAB — URINE CULTURE: Special Requests: NEGATIVE

## 2011-01-12 LAB — URINALYSIS, DIPSTICK ONLY
Nitrite: NEGATIVE
Specific Gravity, Urine: 1.022
Urobilinogen, UA: 0.2

## 2011-01-12 LAB — CALCIUM: Calcium: 7.8 — ABNORMAL LOW

## 2011-01-12 LAB — MAGNESIUM: Magnesium: 1.7

## 2011-01-12 LAB — OVA AND PARASITE EXAMINATION: Ova and parasites: NONE SEEN

## 2011-01-12 LAB — LIPASE, BLOOD: Lipase: 131 — ABNORMAL HIGH

## 2011-01-12 LAB — PHOSPHORUS: Phosphorus: 3

## 2011-01-15 LAB — CULTURE, BLOOD (ROUTINE X 2)
Culture: NO GROWTH
Culture: NO GROWTH

## 2011-01-15 LAB — URINALYSIS, MICROSCOPIC ONLY
Glucose, UA: NEGATIVE
Ketones, ur: 15 — AB
Nitrite: NEGATIVE
Protein, ur: 100 — AB
Specific Gravity, Urine: 1.028
Urobilinogen, UA: 0.2
pH: 5

## 2011-01-15 LAB — CBC
HCT: 25.5 — ABNORMAL LOW
HCT: 25.6 — ABNORMAL LOW
HCT: 25.8 — ABNORMAL LOW
HCT: 26.7 — ABNORMAL LOW
HCT: 26.7 — ABNORMAL LOW
HCT: 27.5 — ABNORMAL LOW
HCT: 28.3 — ABNORMAL LOW
HCT: 29.7 — ABNORMAL LOW
Hemoglobin: 10.1 — ABNORMAL LOW
Hemoglobin: 8.2 — ABNORMAL LOW
Hemoglobin: 8.6 — ABNORMAL LOW
Hemoglobin: 8.8 — ABNORMAL LOW
Hemoglobin: 9 — ABNORMAL LOW
Hemoglobin: 9 — ABNORMAL LOW
Hemoglobin: 9.1 — ABNORMAL LOW
Hemoglobin: 9.5 — ABNORMAL LOW
MCHC: 33.1
MCHC: 33.2
MCHC: 33.5
MCHC: 33.7
MCHC: 33.8
MCHC: 33.9
MCHC: 34
MCV: 91.3
MCV: 91.7
MCV: 91.7
MCV: 91.7
MCV: 92.2
MCV: 92.4
Platelets: 323
Platelets: 328
Platelets: 329
Platelets: 343
Platelets: 374
Platelets: 381
Platelets: 403 — ABNORMAL HIGH
Platelets: 433 — ABNORMAL HIGH
Platelets: 455 — ABNORMAL HIGH
RBC: 2.79 — ABNORMAL LOW
RBC: 2.86 — ABNORMAL LOW
RBC: 2.89 — ABNORMAL LOW
RBC: 2.93 — ABNORMAL LOW
RBC: 3.24 — ABNORMAL LOW
RDW: 16.8 — ABNORMAL HIGH
RDW: 16.8 — ABNORMAL HIGH
RDW: 17 — ABNORMAL HIGH
RDW: 17.1 — ABNORMAL HIGH
RDW: 17.4 — ABNORMAL HIGH
RDW: 17.4 — ABNORMAL HIGH
RDW: 17.5 — ABNORMAL HIGH
RDW: 17.8 — ABNORMAL HIGH
RDW: 17.8 — ABNORMAL HIGH
RDW: 17.9 — ABNORMAL HIGH
WBC: 11.6 — ABNORMAL HIGH
WBC: 13 — ABNORMAL HIGH
WBC: 14.2 — ABNORMAL HIGH
WBC: 15.1 — ABNORMAL HIGH
WBC: 15.9 — ABNORMAL HIGH
WBC: 17.2 — ABNORMAL HIGH

## 2011-01-15 LAB — LIPID PANEL
Cholesterol: 145
HDL: <10 — ABNORMAL LOW
LDL Cholesterol: UNDETERMINED
Triglycerides: 408 — ABNORMAL HIGH

## 2011-01-15 LAB — POCT I-STAT 3, ART BLOOD GAS (G3+)
Acid-base deficit: 12 — ABNORMAL HIGH
Acid-base deficit: 13 — ABNORMAL HIGH
Acid-base deficit: 7 — ABNORMAL HIGH
Acid-base deficit: 9 — ABNORMAL HIGH
Bicarbonate: 14.1 — ABNORMAL LOW
Bicarbonate: 15 — ABNORMAL LOW
Bicarbonate: 16.9 — ABNORMAL LOW
Bicarbonate: 17.1 — ABNORMAL LOW
O2 Saturation: 91
O2 Saturation: 93
O2 Saturation: 93
O2 Saturation: 94
O2 Saturation: 94
O2 Saturation: 96
Operator id: 257001
Patient temperature: 98.3
Patient temperature: 98.9
TCO2: 15
TCO2: 16
TCO2: 18
TCO2: 20
TCO2: 22
TCO2: 27
pCO2 arterial: 31.9 — ABNORMAL LOW
pCO2 arterial: 34.6 — ABNORMAL LOW
pCO2 arterial: 35.3
pCO2 arterial: 37.8
pCO2 arterial: 38.8
pCO2 arterial: 45.2 — ABNORMAL HIGH
pH, Arterial: 7.207 — ABNORMAL LOW
pH, Arterial: 7.253 — ABNORMAL LOW
pO2, Arterial: 59 — ABNORMAL LOW
pO2, Arterial: 70 — ABNORMAL LOW
pO2, Arterial: 77 — ABNORMAL LOW
pO2, Arterial: 86
pO2, Arterial: 86
pO2, Arterial: 95

## 2011-01-15 LAB — GRAM STAIN

## 2011-01-15 LAB — COMPREHENSIVE METABOLIC PANEL
ALT: 26
Albumin: 1.9 — ABNORMAL LOW
Alkaline Phosphatase: 75
Chloride: 110
Potassium: 3.4 — ABNORMAL LOW
Sodium: 140
Total Bilirubin: 0.8
Total Protein: 5.7 — ABNORMAL LOW

## 2011-01-15 LAB — BASIC METABOLIC PANEL
BUN: 10
BUN: 12
BUN: 15
BUN: 17
BUN: 3 — ABNORMAL LOW
BUN: 34 — ABNORMAL HIGH
BUN: 36 — ABNORMAL HIGH
BUN: 4 — ABNORMAL LOW
BUN: 7
CO2: 14 — ABNORMAL LOW
CO2: 16 — ABNORMAL LOW
CO2: 20
CO2: 22
CO2: 22
CO2: 24
CO2: 26
CO2: 29
Calcium: 7.1 — ABNORMAL LOW
Calcium: 7.3 — ABNORMAL LOW
Calcium: 7.4 — ABNORMAL LOW
Calcium: 7.5 — ABNORMAL LOW
Calcium: 7.6 — ABNORMAL LOW
Calcium: 8 — ABNORMAL LOW
Calcium: 8.1 — ABNORMAL LOW
Calcium: 8.2 — ABNORMAL LOW
Calcium: 8.2 — ABNORMAL LOW
Chloride: 105
Chloride: 106
Chloride: 108
Chloride: 108
Chloride: 109
Chloride: 109
Chloride: 113 — ABNORMAL HIGH
Creatinine, Ser: 0.85
Creatinine, Ser: 1.15
Creatinine, Ser: 1.38 — ABNORMAL HIGH
Creatinine, Ser: 1.5 — ABNORMAL HIGH
Creatinine, Ser: 1.83 — ABNORMAL HIGH
Creatinine, Ser: 5.59 — ABNORMAL HIGH
GFR calc Af Amer: 10 — ABNORMAL LOW
GFR calc Af Amer: 19 — ABNORMAL LOW
GFR calc Af Amer: 36 — ABNORMAL LOW
GFR calc Af Amer: 50 — ABNORMAL LOW
GFR calc Af Amer: 54 — ABNORMAL LOW
GFR calc Af Amer: 55 — ABNORMAL LOW
GFR calc Af Amer: 56 — ABNORMAL LOW
GFR calc Af Amer: >60
GFR calc Af Amer: >60
GFR calc non Af Amer: 10 — ABNORMAL LOW
GFR calc non Af Amer: 16 — ABNORMAL LOW
GFR calc non Af Amer: 30 — ABNORMAL LOW
GFR calc non Af Amer: 34 — ABNORMAL LOW
GFR calc non Af Amer: 41 — ABNORMAL LOW
GFR calc non Af Amer: 42 — ABNORMAL LOW
GFR calc non Af Amer: 44 — ABNORMAL LOW
GFR calc non Af Amer: 46 — ABNORMAL LOW
GFR calc non Af Amer: 47 — ABNORMAL LOW
GFR calc non Af Amer: 8 — ABNORMAL LOW
GFR calc non Af Amer: >60
Glucose, Bld: 101 — ABNORMAL HIGH
Glucose, Bld: 114 — ABNORMAL HIGH
Glucose, Bld: 64 — ABNORMAL LOW
Glucose, Bld: 70
Glucose, Bld: 70
Glucose, Bld: 72
Glucose, Bld: 74
Glucose, Bld: 79
Potassium: 2.8 — ABNORMAL LOW
Potassium: 3.1 — ABNORMAL LOW
Potassium: 3.2 — ABNORMAL LOW
Potassium: 3.2 — ABNORMAL LOW
Potassium: 3.3 — ABNORMAL LOW
Potassium: 3.4 — ABNORMAL LOW
Potassium: 3.5
Potassium: 3.6
Potassium: 3.9
Potassium: 4.2
Potassium: 4.6
Sodium: 127 — ABNORMAL LOW
Sodium: 131 — ABNORMAL LOW
Sodium: 133 — ABNORMAL LOW
Sodium: 141
Sodium: 142
Sodium: 143
Sodium: 143
Sodium: 143
Sodium: 144
Sodium: 146 — ABNORMAL HIGH
Sodium: 151 — ABNORMAL HIGH

## 2011-01-15 LAB — URINE CULTURE
Colony Count: NO GROWTH
Culture: NO GROWTH
Special Requests: NEGATIVE

## 2011-01-15 LAB — DIFFERENTIAL
Basophils Absolute: 0
Basophils Absolute: 0
Basophils Relative: 0
Basophils Relative: 0
Basophils Relative: 1
Eosinophils Absolute: 0.2
Eosinophils Absolute: 0.3
Eosinophils Relative: 1
Eosinophils Relative: 3
Lymphocytes Relative: 19
Lymphs Abs: 3
Lymphs Abs: 3.3
Monocytes Absolute: 1.5 — ABNORMAL HIGH
Monocytes Relative: 9
Neutro Abs: 12.2 — ABNORMAL HIGH
Neutro Abs: 13.8 — ABNORMAL HIGH
Neutrophils Relative %: 69
Neutrophils Relative %: 71
Neutrophils Relative %: 80 — ABNORMAL HIGH
WBC Morphology: INCREASED

## 2011-01-15 LAB — VANCOMYCIN, RANDOM
Vancomycin Rm: 17.5
Vancomycin Rm: 9.7

## 2011-01-15 LAB — OSMOLALITY, URINE: Osmolality, Ur: 322 — ABNORMAL LOW

## 2011-01-15 LAB — CARBOXYHEMOGLOBIN
Carboxyhemoglobin: 1.4
Carboxyhemoglobin: 1.8 — ABNORMAL HIGH
Methemoglobin: 0.6
Methemoglobin: 1.2
O2 Saturation: 79.1
O2 Saturation: 80.5
Total hemoglobin: 8.7 — ABNORMAL LOW
Total hemoglobin: 9.9 — ABNORMAL LOW

## 2011-01-15 LAB — HEPATIC FUNCTION PANEL
ALT: 27
AST: 31
Albumin: 1.6 — ABNORMAL LOW
Alkaline Phosphatase: 95
Bilirubin, Direct: 0.1
Indirect Bilirubin: 0.8
Total Bilirubin: 0.9
Total Protein: 5.5 — ABNORMAL LOW

## 2011-01-15 LAB — CARDIAC PANEL(CRET KIN+CKTOT+MB+TROPI)
Relative Index: 2.7 — ABNORMAL HIGH
Relative Index: 2.7 — ABNORMAL HIGH
Total CK: 357 — ABNORMAL HIGH
Troponin I: 0.03
Troponin I: 0.04

## 2011-01-15 LAB — MAGNESIUM
Magnesium: 1.7
Magnesium: 1.9

## 2011-01-15 LAB — ABO/RH: ABO/RH(D): A POS

## 2011-01-15 LAB — D-DIMER, QUANTITATIVE: D-Dimer, Quant: 4.7 — ABNORMAL HIGH

## 2011-01-15 LAB — SODIUM, URINE, RANDOM: Sodium, Ur: 39

## 2011-01-15 LAB — PROTIME-INR
INR: 1.3
Prothrombin Time: 16.9 — ABNORMAL HIGH

## 2011-01-15 LAB — TYPE AND SCREEN
ABO/RH(D): A POS
Antibody Screen: NEGATIVE

## 2011-01-15 LAB — TSH: TSH: 3.771

## 2011-01-15 LAB — APTT: aPTT: 30

## 2011-01-15 LAB — B-NATRIURETIC PEPTIDE (CONVERTED LAB): Pro B Natriuretic peptide (BNP): 69

## 2011-01-15 LAB — PHOSPHORUS: Phosphorus: 2.5

## 2011-01-15 LAB — CORTISOL: Cortisol, Plasma: 31.8

## 2011-02-14 ENCOUNTER — Encounter (HOSPITAL_COMMUNITY): Payer: Self-pay | Admitting: Pharmacy Technician

## 2011-02-28 ENCOUNTER — Ambulatory Visit (HOSPITAL_COMMUNITY): Admission: RE | Admit: 2011-02-28 | Payer: Medicare Other | Source: Ambulatory Visit | Admitting: Internal Medicine

## 2011-02-28 ENCOUNTER — Encounter (HOSPITAL_COMMUNITY): Admission: RE | Payer: Self-pay | Source: Ambulatory Visit

## 2011-02-28 SURGERY — ESOPHAGOGASTRODUODENOSCOPY (EGD) WITH ESOPHAGEAL DILATION
Anesthesia: Moderate Sedation

## 2011-05-08 ENCOUNTER — Telehealth (INDEPENDENT_AMBULATORY_CARE_PROVIDER_SITE_OTHER): Payer: Self-pay | Admitting: *Deleted

## 2011-05-08 NOTE — Telephone Encounter (Signed)
[name] called and said [facility] couldn't get her EGD scheduled for the morning time. Advised Dr. Jackolyn Confer office Dewayne Hatch will call [facility] first thing in the morning, 05/09/11, and get the EGD scheduled.

## 2011-05-10 ENCOUNTER — Other Ambulatory Visit (INDEPENDENT_AMBULATORY_CARE_PROVIDER_SITE_OTHER): Payer: Self-pay | Admitting: *Deleted

## 2011-05-10 ENCOUNTER — Telehealth (INDEPENDENT_AMBULATORY_CARE_PROVIDER_SITE_OTHER): Payer: Self-pay | Admitting: *Deleted

## 2011-05-10 ENCOUNTER — Encounter (INDEPENDENT_AMBULATORY_CARE_PROVIDER_SITE_OTHER): Payer: Self-pay | Admitting: *Deleted

## 2011-05-10 NOTE — Telephone Encounter (Signed)
Requesting MD/PCP:  tapper     Name & DOB: [name]     Procedure: EGD/ED  Reason/Indication:  dysphagia  Has patient had this procedure before?  yes  If so, when, by whom and where?  2001  Is there a family history of colon cancer?    Who?  What age when diagnosed?    Is patient diabetic?   yes      Does patient have prosthetic heart valve?  no  Do you have a pacemaker?  no  Has patient had joint replacement within last 12 months?  no  Is patient on Coumadin, Plavix and/or Aspirin? no  Medications: see EPIC  Allergies: kelfex, sulfa antibiotics  Pharmacy:   Medication Adjustment:   Procedure date & time: 06/25/11  @ 730

## 2011-05-10 NOTE — Telephone Encounter (Signed)
EGD sch'd 05/28/11, patient aware

## 2011-05-11 NOTE — Telephone Encounter (Signed)
agree

## 2011-05-24 ENCOUNTER — Encounter (HOSPITAL_COMMUNITY): Payer: Self-pay | Admitting: Pharmacy Technician

## 2011-05-25 MED ORDER — SODIUM CHLORIDE 0.45 % IV SOLN
Freq: Once | INTRAVENOUS | Status: AC
  Administered 2011-05-28: 07:00:00 via INTRAVENOUS

## 2011-05-28 ENCOUNTER — Encounter (HOSPITAL_COMMUNITY)
Admission: RE | Disposition: A | Discharge status: Home / Self Care | Payer: Self-pay | Source: Ambulatory Visit | Attending: Internal Medicine

## 2011-05-28 ENCOUNTER — Ambulatory Visit (HOSPITAL_COMMUNITY)
Admission: RE | Admit: 2011-05-28 | Discharge: 2011-05-28 | Disposition: A | Discharge status: Home / Self Care | Payer: Medicare Other | Source: Ambulatory Visit | Attending: Internal Medicine | Admitting: Internal Medicine

## 2011-05-28 ENCOUNTER — Encounter (HOSPITAL_COMMUNITY): Payer: Self-pay | Admitting: *Deleted

## 2011-05-28 DIAGNOSIS — K449 Diaphragmatic hernia without obstruction or gangrene: Secondary | ICD-10-CM

## 2011-05-28 DIAGNOSIS — E119 Type 2 diabetes mellitus without complications: Secondary | ICD-10-CM | POA: Insufficient documentation

## 2011-05-28 DIAGNOSIS — R1310 Dysphagia, unspecified: Secondary | ICD-10-CM | POA: Insufficient documentation

## 2011-05-28 DIAGNOSIS — K219 Gastro-esophageal reflux disease without esophagitis: Secondary | ICD-10-CM

## 2011-05-28 HISTORY — PX: ESOPHAGOGASTRODUODENOSCOPY: SHX5428

## 2011-05-28 SURGERY — EGD (ESOPHAGOGASTRODUODENOSCOPY)
Anesthesia: Moderate Sedation

## 2011-05-28 MED ORDER — MIDAZOLAM HCL 5 MG/5ML IJ SOLN
INTRAMUSCULAR | Status: AC
  Filled 2011-05-28: qty 10

## 2011-05-28 MED ORDER — BUTAMBEN-TETRACAINE-BENZOCAINE 2-2-14 % EX AERO
INHALATION_SPRAY | CUTANEOUS | Status: DC | PRN
  Administered 2011-05-28: 2 via TOPICAL

## 2011-05-28 MED ORDER — MEPERIDINE HCL 50 MG/ML IJ SOLN
INTRAMUSCULAR | Status: AC
  Filled 2011-05-28: qty 1

## 2011-05-28 MED ORDER — MEPERIDINE HCL 25 MG/ML IJ SOLN
INTRAMUSCULAR | Status: DC | PRN
  Administered 2011-05-28 (×2): 25 mg via INTRAVENOUS

## 2011-05-28 MED ORDER — STERILE WATER FOR IRRIGATION IR SOLN
Status: DC | PRN
  Administered 2011-05-28: 07:00:00

## 2011-05-28 MED ORDER — MIDAZOLAM HCL 5 MG/5ML IJ SOLN
INTRAMUSCULAR | Status: DC | PRN
  Administered 2011-05-28 (×4): 2 mg via INTRAVENOUS

## 2011-05-28 NOTE — H&P (Signed)
[name] is an 51 y.o. female.   Chief Complaint: Patient sent for esophagogastroduodenoscopy and esophageal dilation. HPI: Patient is 51 year old Caucasian female with multiple medical problems including chronic or presents with over a year history of dysphagia primarily to solids but drinking having difficulty with liquids. She states heartburn is well controlled with Nexium. She denies vomiting or melena. She has generalized abdominal pain. She has intermittent hematochezia. Her constipation is well-controlled at [facility]. She has had her esophagus dilated a few years ago at [facility] in [place], [place].  Past Medical History  Diagnosis Date  . IBS (irritable bowel syndrome)   . GERD (gastroesophageal reflux disease)   . Migraines   . Sleep apnea   . Back pain   . Hypothyroid   . Osteoarthritis   . Diabetes mellitus   . Obesity     Past Surgical History  Procedure Date  . Cholecystectomy   . Hernia repair   . Breast biopsy   . Total abdominal hysterectomy     Left oophroectomy for a lare tumor about 21 yrs. RT ovary removed time of hysterectomy    History reviewed. No pertinent family history. Social History:  reports that she has been smoking.  She does not have any smokeless tobacco history on file. She reports that she does not drink alcohol or use illicit drugs.  Allergies:  Allergies  Allergen Reactions  . Keflex Other (See Comments)    unknown  . Sulfa Antibiotics     Vomiting     Medications Prior to Admission  Medication Dose Route Frequency Provider Last Rate Last Dose  . 0.45 % sodium chloride infusion   Intravenous Once Malissa Hippo, MD 20 mL/hr at 05/28/11 0728    . meperidine (DEMEROL) 50 MG/ML injection           . midazolam (VERSED) 5 MG/5ML injection           . simethicone susp in sterile water 1000 mL irrigation    PRN Malissa Hippo, MD       Medications Prior to Admission  Medication Sig Dispense Refill  . furosemide (LASIX) 40 MG tablet Take 40 mg  by mouth 3 (three) times daily.        . hydrocodone-acetaminophen (LORCET PLUS) 7.5-650 MG per tablet Take 1 tablet by mouth every 6 (six) hours as needed. For pain      . levothyroxine (SYNTHROID, LEVOTHROID) 175 MCG tablet Take 175 mcg by mouth daily.        . metoprolol tartrate (LOPRESSOR) 25 MG tablet Take 25 mg by mouth 2 (two) times daily.       . Milnacipran (SAVELLA) 50 MG TABS Take 50 mg by mouth 2 (two) times daily.        . Multiple Vitamins-Iron (MULTIVITAMINS WITH IRON) TABS Take 1 tablet by mouth daily.      Marland Kitchen NEXIUM 40 MG capsule TAKE 1 CAPSULE BY MOUTH EVERY DAY  30 capsule  11  . polyethylene glycol (MIRALAX / GLYCOLAX) packet Take 17 g by mouth 2 (two) times daily before a meal.        . pregabalin (LYRICA) 100 MG capsule Take 100 mg by mouth 3 (three) times daily.        Marland Kitchen tiZANidine (ZANAFLEX) 4 MG capsule Take 4 mg by mouth every 6 (six) hours.       . topiramate (TOPAMAX) 100 MG tablet Take 100 mg by mouth 2 (two) times daily.        Marland Kitchen  traMADol (ULTRAM) 50 MG tablet Take 50 mg by mouth 3 (three) times daily.        Marland Kitchen zolpidem (AMBIEN) 10 MG tablet Take 10 mg by mouth at bedtime.       . beta carotene w/minerals (OCUVITE) tablet Take 1 tablet by mouth daily.        . fentaNYL (DURAGESIC - DOSED MCG/HR) 100 MCG/HR Place 1 patch onto the skin every 3 (three) days.          No results found for this or any previous visit (from the past 48 hour(s)). No results found.  Review of Systems  Constitutional: Negative for weight loss.    Blood pressure 159/85, pulse 79, temperature 98.3 F (36.8 C), temperature source Oral, resp. rate 15, height 5\' 4"  (1.626 m), weight 210 lb (95.255 kg), SpO2 99.00%. Physical Exam  Constitutional: She appears well-developed and well-nourished.  HENT:  Mouth/Throat: Oropharynx is clear and moist.  Eyes: Conjunctivae are normal. No scleral icterus.  Neck: No thyromegaly present.  Cardiovascular: Normal rate, regular rhythm and normal heart  sounds.   No murmur heard. Respiratory: Effort normal and breath sounds normal.  GI: Soft. She exhibits no mass. There is Tenderness: mild generalized tenderness..  Musculoskeletal: She exhibits no edema.  Lymphadenopathy:    She has no cervical adenopathy.  Neurological: She is alert.  Skin: Skin is warm and dry.     Assessment/Plan Dysphagia. Chronic GERD. EGD an ED.  [name] U 05/28/2011, 7:37 AM

## 2011-05-28 NOTE — Discharge Instructions (Signed)
Resume usual medications and diet. No driving for 24 hours. Call  office with  progress report in one week.  Esophagogastroduodenoscopy This is an endoscopic procedure (a procedure that uses a device like a flexible telescope) that allows your caregiver to view the upper stomach and small bowel. This test allows your caregiver to look at the esophagus. The esophagus carries food from your mouth to your stomach. They can also look at your duodenum. This is the first part of the small intestine that attaches to the stomach. This test is used to detect problems in the bowel such as ulcers and inflammation. PREPARATION FOR TEST Nothing to eat after midnight the day before the test. NORMAL FINDINGS Normal esophagus, stomach, and duodenum. Ranges for normal findings may vary among different laboratories and hospitals. You should always check with your doctor after having lab work or other tests done to discuss the meaning of your test results and whether your values are considered within normal limits. MEANING OF TEST  Your caregiver will go over the test results with you and discuss the importance and meaning of your results, as well as treatment options and the need for additional tests if necessary. OBTAINING THE TEST RESULTS It is your responsibility to obtain your test results. Ask the lab or department performing the test when and how you will get your results. Document Released: 07/20/2004 Document Revised: 11/29/2010 Document Reviewed: 02/27/2008 [facility] Patient Information 2012 [place], [place].Esophageal Dilatation The esophagus is the long, narrow tube which carries food and liquid from the mouth to the stomach. Esophageal dilatation is the technique used to stretch a blocked or narrowed portion of the esophagus. This procedure is used when a part of the esophagus has become so narrow that it becomes difficult, painful or even impossible to swallow. This is generally an uncomplicated form of  treatment. When this is not successful, chest surgery may be required. This is a much more extensive form of treatment with a longer recovery time. CAUSES  Some of the more common causes of blockage or strictures of the esophagus are:  Narrowing from longstanding inflammation (soreness and redness) of the lower esophagus. This comes from the constant exposure of the lower esophagus to the acid which bubbles up from the stomach. Over time this causes scarring and narrowing of the lower esophagus.   Hiatal hernia in which a small part of the stomach bulges (herniates) up through the diaphragm. This can cause a gradual narrowing of the end of the esophagus.   Schatzki's Ring is a narrow ring of benign (non-cancerous) fibrous tissue which constricts the lower esophagus. The reason for this is not known.   Scleroderma is a connective tissue disorder that affects the esophagus and makes swallowing difficult.   Achalasia is an absence of nerves to the lower esophagus and to the esophageal sphincter. This is the circular muscle between the stomach and esophagus that relaxes to allow food into the stomach. After swallowing, it contracts to keep food in the stomach. This absence of nerves may be congenital (present since birth). This can cause irregular spasms of the lower esophageal muscle. This spasm does not open up to allow food and fluid through. The result is a persistent blockage with subsequent slow trickling of the esophageal contents into the stomach.   Strictures may develop from swallowing materials which damage the esophagus. Some examples are strong acids or alkalis such as lye.   Growths such as benign (non-cancerous) and malignant (cancerous) tumors can block the esophagus.  Heredity (present since birth) causes.  DIAGNOSIS  Your caregiver often suspects this problem by taking a medical history. They will also do a physical exam. They can then prove their suspicions using X-rays and  endoscopy. Endoscopy is an exam in which a tube like a small flexible telescope is used to look at your esophagus.  TREATMENT There are different stretching (dilating) techniques which can be used. Simple bougie dilatation may be done in the office. This usually takes only a couple minutes. A numbing (anesthetic) spray of the throat is used. Endoscopy, when done, is done in an endoscopy suite, under mild sedation. When fluoroscopy is used, the procedure is performed in X-ray. Other techniques require a little longer time. Recovery is usually quick. There is no waiting time to begin eating and drinking to test success of the treatment. Following are some of the methods used. Narrowing of the esophagus is treated by making it bigger. Commonly this is a mechanical problem which can be treated with stretching. This can be done in different ways. Your caregiver will discuss these with you. Some of the means used are:  A series of graduated (increasing thickness) flexible dilators can be used. These are weighted tubes passed through the esophagus into the stomach. The tubes used become progressively larger until the desired stretched size is reached. Graduated dilators are a simple and quick way of opening the esophagus. No visualization is required.   Another method is the use of endoscopy to place a flexible wire across the stricture. The endoscope is removed and the wire left in place. A dilator with a hole through it from end to end is guided down the esophagus and across the stricture. One or more of these dilators are passed over the wire. At the end of the exam, the wire is removed. This type of treatment may be performed in the X-ray department under fluoroscopy. An advantage of this procedure is the examiner is visualizing the end opening in the esophagus.   Stretching of the esophagus may be done using balloons. Deflated balloons are placed through the endoscope and across the stricture. This type of  balloon dilatation is often done at the time of endoscopy or fluoroscopy. Flexible endoscopy allows the examiner to directly view the stricture. A balloon is inserted in the deflated form into the area of narrowing. It is then inflated with air to a certain pressure that is pre-set for a given circumference. When inflated, it becomes sausage shaped, stretched, and makes the stricture larger.   Achalasia requires a longer larger balloon-type dilator. This is frequently done under X-ray control. In this situation, the spastic muscle fibers in the lower esophagus are stretched.  All of the above procedures make the passage of food and water into the stomach easier. They also make it easier for stomach contents to reflux back into the esophagus. Special medications may be used following the procedure to help prevent further stricturing. Proton-pump inhibitor medications are good at decreasing the amount of acid in the stomach juice. When stomach juice refluxes into the esophagus, the juice is no longer as acidic and is less likely to burn or scar the esophagus. RISKS AND COMPLICATIONS Esophageal dilatation is usually performed effectively and without problems. Some complications that can occur are:  A small amount of bleeding almost always happens where the stretching takes place. If this is too excessive it may require more aggressive treatment.   An uncommon complication is perforation (making a hole) of the esophagus. The  esophagus is thin. It is easy to make a hole in it. If this happens, an operation may be necessary to repair this.   A small, undetected perforation could lead to an infection in the chest. This can be very serious.  HOME CARE INSTRUCTIONS   If you received sedation for your procedure, do not drive, make important decisions, or perform any activities requiring your full coordination. Do not drink alcohol, take sedatives, or use any mind altering chemicals unless instructed by your  caregiver.   You may use throat lozenges or warm salt water gargles if you have throat discomfort   You can begin eating and drinking normally on return home unless instructed otherwise. Do not purposely try to force large chunks of food down to test the benefits of your procedure.   Mild discomfort can be eased with sips of ice water.   Medications for discomfort may or may not be needed.  SEEK IMMEDIATE MEDICAL CARE IF:   You begin vomiting up blood.   You develop black tarry stools   You develop chills or an unexplained temperature of over 101 F (38.3 C)   You develop chest or abdominal pain.   You develop shortness of breath or feel lightheaded or faint.   Your swallowing is becoming more painful, difficult, or you are unable to swallow.  MAKE SURE YOU:   Understand these instructions.   Will watch your condition.   Will get help right away if you are not doing well or get worse.  Document Released: 05/10/2005 Document Revised: 11/29/2010 Document Reviewed: 06/27/2005 [facility] Patient Information 2012 [place], [place].

## 2011-05-28 NOTE — Op Note (Signed)
EGD PROCEDURE REPORT  PATIENT:  [name]  MR#:  [id number] Birthdate:  Oct 31, 1960, 51 y.o., female Endoscopist:  Dr. Malissa Hippo, MD Referred By:  Dr. Oley Balm. Margo Common, MD Procedure Date: 05/28/2011  Procedure:   EGDwith ED.  Indications:  Patient is 51 year old Caucasian female with chronic GERD who presents with dysphagia primarily for solids also liquids for her heartburn is well controlled with therapy.            Informed Consent:  Procedure and risks were reviewed with the patient and informed consent was obtained.  Medications:  Demerol 50  mg IV Versed 8 mg IV Cetacaine spray topically for oropharyngeal anesthesia  Description of procedure:  The endoscope was introduced through the mouth and advanced to the second portion of the duodenum without difficulty or limitations. The mucosal surfaces were surveyed very carefully during advancement of the scope and upon withdrawal.  Findings:  Esophagus:  Mucosa of the esophagus was normal. No ring or stricture was noted.. GEJ:  37 cm Hiatus:  39 cm Stomach:  Stomach was empty and distended very well with insufflation. The folds in the proximal stomach are normal. Examination of mucosa at body, antrum, pyloric channel, angularis, fundus and cardia was normal. Duodenum:  Normal bulbar and post bulbar mucosa  Therapeutic/Diagnostic Maneuvers Performed:  Esophagus dilated by passing a 56 French Maloney dilator to full insertion. Esophageal  mucosa was reexamined post dilation and no mucosal disruption noted.  Complications:  None  Impression: Small sliding hiatal hernia otherwise normal esophagogastroduodenoscopy. Esophagus dilated by passing 56 French Maloney dilator but no mucosal disruption induced.   Recommendations:  Continue anti-reflex measures and Nexium as before. Will consider esophageal manometry if dysphagia persists  Jillyn Stacey U  05/28/2011  8:00 AM  CC: Dr. Louie Boston, MD, MD & Dr. Bonnetta Barry ref. provider  found

## 2011-06-04 ENCOUNTER — Encounter (HOSPITAL_COMMUNITY): Payer: Self-pay | Admitting: Internal Medicine

## 2011-12-06 ENCOUNTER — Encounter (HOSPITAL_COMMUNITY): Payer: Self-pay | Admitting: Pharmacy Technician

## 2011-12-06 NOTE — Patient Instructions (Addendum)
Your procedure is scheduled on:  12/13/2011  Report to [facility] at  8:00    AM.  Call this number if you have problems the morning of surgery: [phone]   Remember:   Do not drink or eat food:After Midnight.    Clear liquids include soda, tea, black coffee, apple or grape juice, broth.  Take these medicines the morning of surgery with A SIP OF WATER: Synthroid, Metoprolol   Do not wear jewelry, make-up or nail polish.  Do not wear lotions, powders, or perfumes. You may wear deodorant.  Do not shave 48 hours prior to surgery.  Do not bring valuables to the hospital.  Contacts, dentures or bridgework may not be worn into surgery.  Leave suitcase in the car. After surgery it may be brought to your room.  For patients admitted to the hospital, checkout time is 11:00 AM the day of discharge.   Patients discharged the day of surgery will not be allowed to drive home.  Name and phone number of your driver:   Special Instructions: CHG Shower use special wash: 1/2 bottle night before surgery and 1/2 bottle morning of surgery.   Please read over the following fact sheets that you were given: Pain Booklet, MRSA  Surgical Site Infection Prevention, Anesthesia Post-op Instructions and Care and Recovery After Surgery   Monitored Anesthesia Care (MAC)  MAC stands for monitored anesthesia care. MAC usually means a tube is not put in your trachea (windpipe). MAC may also be called moderate sedation. MAC usually involves giving intravenous anesthetic drugs, oxygen, watching vital signs and standard patient monitoring procedures similar to those used during a general anesthetic. MAC can be done without going to the operating room. MAC is typically used for small procedures that cannot be done with only local anesthesia. MAC usually means lower doses of anesthetic drugs. The recovery period tends to be shorter. The drugs used cause a lower level of awareness. This means you are partially awake and your  reflexes are intact. You may hear what is being said and feel some pressure, but should not feel pain. The drugs used may affect your ability to remember the procedure. If you have depressed consciousness and lose some protective reflexes, this is called deep sedation. If you become unconscious and fall completely asleep, this is general anesthesia. In both deep sedation and general anesthesia, the caregivers must make sure that your airway remains open. During MAC, the sedation-trained caregivers will:  Give medications which may include:   Sedatives.   Analgesics.   Hypnotics.   Other medications which are needed to keep you comfortable, safe and secure.   Give local anesthetic to numb the procedural site.   Monitor your level of consciousness.   Monitor your blood pressure.   Monitor your heart rate and rhythm.   Monitor your respirations and oxygen levels.   Monitor your airway.   Monitor your level of pain.   Evaluate and treat problems which may occur.  Document Released: 12/13/2004 Document Revised: 03/08/2011 Document Reviewed: 02/15/2009 [facility] Patient Information 2012 [place], [place].

## 2011-12-07 ENCOUNTER — Encounter (HOSPITAL_COMMUNITY): Payer: Self-pay

## 2011-12-07 ENCOUNTER — Other Ambulatory Visit (INDEPENDENT_AMBULATORY_CARE_PROVIDER_SITE_OTHER): Payer: Self-pay | Admitting: Internal Medicine

## 2011-12-07 ENCOUNTER — Encounter (HOSPITAL_COMMUNITY)
Admission: RE | Admit: 2011-12-07 | Discharge: 2011-12-07 | Disposition: A | Discharge status: Home / Self Care | Payer: Medicare Other | Source: Ambulatory Visit | Attending: Podiatry | Admitting: Podiatry

## 2011-12-07 HISTORY — DX: Essential (primary) hypertension: I10

## 2011-12-07 LAB — HEMOGLOBIN AND HEMATOCRIT, BLOOD
HCT: 37.6 % (ref 36.0–46.0)
Hemoglobin: 12.2 g/dL (ref 12.0–15.0)

## 2011-12-07 LAB — BASIC METABOLIC PANEL
Chloride: 107 mEq/L (ref 96–112)
GFR calc Af Amer: >90 mL/min (ref >90)
GFR calc non Af Amer: >90 mL/min (ref >90)
Potassium: 4.1 mEq/L (ref 3.5–5.1)
Sodium: 139 mEq/L (ref 135–145)

## 2011-12-07 LAB — SURGICAL PCR SCREEN: Staphylococcus aureus: NEGATIVE

## 2011-12-13 ENCOUNTER — Encounter (HOSPITAL_COMMUNITY): Payer: Self-pay | Admitting: Anesthesiology

## 2011-12-13 ENCOUNTER — Encounter (HOSPITAL_COMMUNITY): Payer: Self-pay | Admitting: *Deleted

## 2011-12-13 ENCOUNTER — Ambulatory Visit (HOSPITAL_COMMUNITY): Payer: Medicare Other

## 2011-12-13 ENCOUNTER — Ambulatory Visit (HOSPITAL_COMMUNITY)
Admission: RE | Admit: 2011-12-13 | Discharge: 2011-12-13 | Disposition: A | Discharge status: Home / Self Care | Payer: Medicare Other | Source: Ambulatory Visit | Attending: Podiatry | Admitting: Podiatry

## 2011-12-13 ENCOUNTER — Encounter (HOSPITAL_COMMUNITY)
Admission: RE | Disposition: A | Discharge status: Home / Self Care | Payer: Self-pay | Source: Ambulatory Visit | Attending: Podiatry

## 2011-12-13 ENCOUNTER — Ambulatory Visit (HOSPITAL_COMMUNITY): Payer: Medicare Other | Admitting: Anesthesiology

## 2011-12-13 DIAGNOSIS — Z01812 Encounter for preprocedural laboratory examination: Secondary | ICD-10-CM | POA: Insufficient documentation

## 2011-12-13 DIAGNOSIS — E11621 Type 2 diabetes mellitus with foot ulcer: Secondary | ICD-10-CM

## 2011-12-13 DIAGNOSIS — Z01810 Encounter for preprocedural cardiovascular examination: Secondary | ICD-10-CM | POA: Insufficient documentation

## 2011-12-13 DIAGNOSIS — L97509 Non-pressure chronic ulcer of other part of unspecified foot with unspecified severity: Secondary | ICD-10-CM | POA: Insufficient documentation

## 2011-12-13 DIAGNOSIS — E1149 Type 2 diabetes mellitus with other diabetic neurological complication: Secondary | ICD-10-CM | POA: Insufficient documentation

## 2011-12-13 HISTORY — PX: AMPUTATION: SHX166

## 2011-12-13 LAB — GLUCOSE, CAPILLARY: Glucose-Capillary: 111 mg/dL — ABNORMAL HIGH (ref 70–99)

## 2011-12-13 SURGERY — AMPUTATION DIGIT
Anesthesia: Monitor Anesthesia Care | Site: Toe | Laterality: Right | Wound class: Dirty or Infected

## 2011-12-13 MED ORDER — VANCOMYCIN HCL IN DEXTROSE 1-5 GM/200ML-% IV SOLN
INTRAVENOUS | Status: AC
  Filled 2011-12-13: qty 200

## 2011-12-13 MED ORDER — ONDANSETRON HCL 4 MG/2ML IJ SOLN
INTRAMUSCULAR | Status: AC
  Filled 2011-12-13: qty 2

## 2011-12-13 MED ORDER — MIDAZOLAM HCL 2 MG/2ML IJ SOLN
INTRAMUSCULAR | Status: AC
  Filled 2011-12-13: qty 2

## 2011-12-13 MED ORDER — CLINDAMYCIN PHOSPHATE 600 MG/50ML IV SOLN: 600.0000 mg | Freq: Once | INTRAVENOUS | Status: DC

## 2011-12-13 MED ORDER — FENTANYL CITRATE 0.05 MG/ML IJ SOLN
INTRAMUSCULAR | Status: AC
  Filled 2011-12-13: qty 2

## 2011-12-13 MED ORDER — PROPOFOL 10 MG/ML IV EMUL
INTRAVENOUS | Status: AC
  Filled 2011-12-13: qty 20

## 2011-12-13 MED ORDER — BUPIVACAINE HCL (PF) 0.5 % IJ SOLN
INTRAMUSCULAR | Status: DC | PRN
  Administered 2011-12-13: 10 mL

## 2011-12-13 MED ORDER — LIDOCAINE HCL (PF) 1 % IJ SOLN
INTRAMUSCULAR | Status: AC
  Filled 2011-12-13: qty 5

## 2011-12-13 MED ORDER — VANCOMYCIN HCL IN DEXTROSE 1-5 GM/200ML-% IV SOLN: 1000.0000 mg | Freq: Once | INTRAVENOUS | Status: DC

## 2011-12-13 MED ORDER — LIDOCAINE HCL 1 % IJ SOLN
INTRAMUSCULAR | Status: DC | PRN
  Administered 2011-12-13: 50 mg via INTRADERMAL

## 2011-12-13 MED ORDER — CLINDAMYCIN PHOSPHATE 600 MG/50ML IV SOLN
INTRAVENOUS | Status: AC
  Filled 2011-12-13: qty 50

## 2011-12-13 MED ORDER — BUPIVACAINE HCL (PF) 0.5 % IJ SOLN
INTRAMUSCULAR | Status: AC
  Filled 2011-12-13: qty 30

## 2011-12-13 MED ORDER — VANCOMYCIN HCL 1000 MG IV SOLR
1000.0000 mg | INTRAVENOUS | Status: DC | PRN
  Administered 2011-12-13: 1000 mg via INTRAVENOUS

## 2011-12-13 MED ORDER — MIDAZOLAM HCL 5 MG/5ML IJ SOLN
INTRAMUSCULAR | Status: DC | PRN
  Administered 2011-12-13 (×2): 1 mg via INTRAVENOUS
  Administered 2011-12-13: 2 mg via INTRAVENOUS

## 2011-12-13 MED ORDER — ONDANSETRON HCL 4 MG/2ML IJ SOLN
4.0000 mg | Freq: Once | INTRAMUSCULAR | Status: AC
  Administered 2011-12-13: 4 mg via INTRAVENOUS

## 2011-12-13 MED ORDER — PROPOFOL INFUSION 10 MG/ML OPTIME
INTRAVENOUS | Status: DC | PRN
  Administered 2011-12-13: 55 ug/kg/min via INTRAVENOUS

## 2011-12-13 MED ORDER — MIDAZOLAM HCL 2 MG/2ML IJ SOLN
1.0000 mg | INTRAMUSCULAR | Status: DC | PRN
  Administered 2011-12-13: 2 mg via INTRAVENOUS

## 2011-12-13 MED ORDER — LACTATED RINGERS IV SOLN
INTRAVENOUS | Status: DC
  Administered 2011-12-13: 09:00:00 via INTRAVENOUS

## 2011-12-13 MED ORDER — CEFAZOLIN SODIUM-DEXTROSE 2-3 GM-% IV SOLR
INTRAVENOUS | Status: AC
  Filled 2011-12-13: qty 50

## 2011-12-13 MED ORDER — 0.9 % SODIUM CHLORIDE (POUR BTL) OPTIME
TOPICAL | Status: DC | PRN
  Administered 2011-12-13: 1000 mL

## 2011-12-13 MED ORDER — FENTANYL CITRATE 0.05 MG/ML IJ SOLN
INTRAMUSCULAR | Status: DC | PRN
  Administered 2011-12-13: 50 ug via INTRAVENOUS

## 2011-12-13 SURGICAL SUPPLY — 42 items
BAG HAMPER (MISCELLANEOUS) ×2 IMPLANT
BANDAGE CONFORM 2  STR LF (GAUZE/BANDAGES/DRESSINGS) ×2 IMPLANT
BANDAGE ELASTIC 4 VELCRO NS (GAUZE/BANDAGES/DRESSINGS) ×2 IMPLANT
BANDAGE ESMARK 4X12 BL STRL LF (DISPOSABLE) ×1 IMPLANT
BANDAGE GAUZE ELAST BULKY 4 IN (GAUZE/BANDAGES/DRESSINGS) ×2 IMPLANT
BLADE AVERAGE 25X9 (BLADE) IMPLANT
BLADE SURG 15 STRL LF DISP TIS (BLADE) ×2 IMPLANT
BLADE SURG 15 STRL SS (BLADE) ×2
BNDG ESMARK 4X12 BLUE STRL LF (DISPOSABLE) ×2
CLOTH BEACON ORANGE TIMEOUT ST (SAFETY) ×2 IMPLANT
COVER LIGHT HANDLE STERIS (MISCELLANEOUS) ×4 IMPLANT
CUFF TOURNIQUET SINGLE 18IN (TOURNIQUET CUFF) ×2 IMPLANT
DECANTER SPIKE VIAL GLASS SM (MISCELLANEOUS) ×2 IMPLANT
DRSG ADAPTIC 3X8 NADH LF (GAUZE/BANDAGES/DRESSINGS) ×2 IMPLANT
ELECT REM PT RETURN 9FT ADLT (ELECTROSURGICAL) ×2
ELECTRODE REM PT RTRN 9FT ADLT (ELECTROSURGICAL) ×1 IMPLANT
GLOVE BIO SURGEON STRL SZ7.5 (GLOVE) ×2 IMPLANT
GLOVE BIOGEL PI IND STRL 7.0 (GLOVE) ×2 IMPLANT
GLOVE BIOGEL PI INDICATOR 7.0 (GLOVE) ×2
GLOVE EXAM NITRILE MD LF STRL (GLOVE) ×2 IMPLANT
GLOVE SKINSENSE NS SZ6.5 (GLOVE) ×1
GLOVE SKINSENSE NS SZ7.0 (GLOVE) ×1
GLOVE SKINSENSE NS SZ7.5 (GLOVE) ×1
GLOVE SKINSENSE STRL SZ6.5 (GLOVE) ×1 IMPLANT
GLOVE SKINSENSE STRL SZ7.0 (GLOVE) ×1 IMPLANT
GLOVE SKINSENSE STRL SZ7.5 (GLOVE) ×1 IMPLANT
GOWN STRL REIN XL XLG (GOWN DISPOSABLE) ×6 IMPLANT
KIT ROOM TURNOVER APOR (KITS) ×2 IMPLANT
MANIFOLD NEPTUNE II (INSTRUMENTS) ×2 IMPLANT
NEEDLE HYPO 27GX1-1/4 (NEEDLE) ×4 IMPLANT
NS IRRIG 1000ML POUR BTL (IV SOLUTION) ×2 IMPLANT
PACK BASIC LIMB (CUSTOM PROCEDURE TRAY) ×2 IMPLANT
PAD ARMBOARD 7.5X6 YLW CONV (MISCELLANEOUS) ×2 IMPLANT
RASP SM TEAR CROSS CUT (RASP) IMPLANT
SET BASIN LINEN APH (SET/KITS/TRAYS/PACK) ×2 IMPLANT
SOL PREP PROV IODINE SCRUB 4OZ (MISCELLANEOUS) ×2 IMPLANT
SPONGE GAUZE 4X4 12PLY (GAUZE/BANDAGES/DRESSINGS) ×2 IMPLANT
SPONGE LAP 18X18 X RAY DECT (DISPOSABLE) ×2 IMPLANT
SUT ETHILON 4 0 PS 2 18 (SUTURE) IMPLANT
SUT PROLENE 4 0 PS 2 18 (SUTURE) ×2 IMPLANT
SUT VIC AB 4-0 PS2 27 (SUTURE) ×2 IMPLANT
SYR CONTROL 10ML LL (SYRINGE) ×4 IMPLANT

## 2011-12-13 NOTE — Anesthesia Postprocedure Evaluation (Signed)
  Anesthesia Post-op Note  Patient: [name]  Procedure(s) Performed: Procedure(s) (LRB) with comments: AMPUTATION DIGIT (Right) - amputation second toe right foot  Patient Location: PACU  Anesthesia Type: MAC  Level of Consciousness: awake, alert , oriented and patient cooperative  Airway and Oxygen Therapy: Patient Spontanous Breathing  Post-op Pain: none  Post-op Assessment: Post-op Vital signs reviewed, Patient's Cardiovascular Status Stable, Respiratory Function Stable, Patent Airway, No signs of Nausea or vomiting and Pain level controlled  Post-op Vital Signs: Reviewed and stable  Complications: No apparent anesthesia complications

## 2011-12-13 NOTE — H&P (Signed)
HISTORY AND PHYSICAL INTERVAL NOTE:  12/13/2011  9:21 AM  [name]  has presented today for surgery, with the diagnosis of ulceration second toe right foot.  The various methods of treatment have been discussed with the patient.  No guarantees were given.  After consideration of risks, benefits and other options for treatment, the patient has consented to surgery.  I have reviewed the patients' chart and labs.    Patient Vitals for the past 24 hrs:  BP Temp Temp src Pulse Resp SpO2  12/13/11 0920 113/73 mmHg - - - 12  98 %  12/13/11 0919 112/68 mmHg - - - 24  96 %  12/13/11 0918 112/68 mmHg - - - 10  96 %  12/13/11 0917 112/68 mmHg - - - 10  96 %  12/13/11 0916 112/68 mmHg - - - 10  96 %  12/13/11 0915 112/68 mmHg - - - 10  96 %  12/13/11 0914 108/70 mmHg - - - 12  96 %  12/13/11 0913 108/70 mmHg - - - 10  94 %  12/13/11 0912 108/70 mmHg - - - 10  94 %  12/13/11 0820 118/77 mmHg 98.6 F (37 C) Oral 77  20  99 %    A history and physical examination was performed in my office.  The patient was reexamined.  There have been no changes to this history and physical examination.  Dallas Schimke, DPM

## 2011-12-13 NOTE — Progress Notes (Signed)
Discharge instructions give to patient. Didn't sign our copy

## 2011-12-13 NOTE — Transfer of Care (Signed)
Immediate Anesthesia Transfer of Care Note  Patient: [name]  Procedure(s) Performed: Procedure(s) (LRB) with comments: AMPUTATION DIGIT (Right) - amputation second toe right foot  Patient Location: PACU  Anesthesia Type: MAC  Level of Consciousness: awake and patient cooperative  Airway & Oxygen Therapy: Patient Spontanous Breathing and Patient connected to face mask oxygen  Post-op Assessment: Report given to PACU RN, Post -op Vital signs reviewed and stable and Patient moving all extremities  Post vital signs: Reviewed and stable  Complications: No apparent anesthesia complications

## 2011-12-13 NOTE — Anesthesia Preprocedure Evaluation (Signed)
Anesthesia Evaluation  Patient identified by MRN, date of birth, ID band Patient awake    Reviewed: Allergy & Precautions, H&P , NPO status , Patient's Chart, lab work & pertinent test results, reviewed documented beta blocker date and time   History of Anesthesia Complications Negative for: history of anesthetic complications  Airway Mallampati: II      Dental  (+) Edentulous Upper and Edentulous Lower   Pulmonary sleep apnea , Current Smoker,  breath sounds clear to auscultation        Cardiovascular hypertension, Pt. on medications Rhythm:Regular Rate:Normal     Neuro/Psych  Headaches,    GI/Hepatic GERD-  ,  Endo/Other  diabetes, Well Controlled, Type 2, Oral Hypoglycemic AgentsHypothyroidism   Renal/GU      Musculoskeletal  (+) Arthritis - (Fentanyl patch),   Abdominal   Peds  Hematology   Anesthesia Other Findings   Reproductive/Obstetrics                           Anesthesia Physical Anesthesia Plan  ASA: III  Anesthesia Plan: MAC   Post-op Pain Management:    Induction: Intravenous  Airway Management Planned: Nasal Cannula  Additional Equipment:   Intra-op Plan:   Post-operative Plan:   Informed Consent: I have reviewed the patients History and Physical, chart, labs and discussed the procedure including the risks, benefits and alternatives for the proposed anesthesia with the patient or authorized representative who has indicated his/her understanding and acceptance.     Plan Discussed with:   Anesthesia Plan Comments:         Anesthesia Quick Evaluation

## 2011-12-13 NOTE — Brief Op Note (Signed)
BRIEF OPERATIVE NOTE  SURGEON:   Dallas Schimke, DPM  OR STAFF:   Cyndie Chime, RN - Circulator Sherri Sear, CST - Scrub Person Lennox Pippins, RN - Relief Circulator Donald Pore, CST - Float Surgical Tech Lizabeth Leyden, RN - RN First Assistant   PREOPERATIVE DIAGNOSIS:   1. Nonhealing ulceration second toe right foot 2. 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  DICTATION:  Other Dictation: Dictation Number [id number]

## 2011-12-14 NOTE — Op Note (Signed)
NAMEBRITLYN, [name]               ACCOUNT NO.:  [id number]  MEDICAL RECORD NO.:  [id number]  LOCATION:  [facility]                          FACILITY:  [facility]  PHYSICIAN:  B. Theola Sequin, MD   DATE OF BIRTH:  09-02-1960  DATE OF PROCEDURE:  12/13/2011 DATE OF DISCHARGE:                              OPERATIVE REPORT   PREOPERATIVE DIAGNOSES: 1. Nonhealing ulceration, second toe, right foot. 2. Diabetes mellitus with peripheral neuropathy.  POSTOPERATIVE DIAGNOSES: 1. Nonhealing ulceration, second toe, right foot. 2. Diabetes mellitus with peripheral neuropathy.  PROCEDURE:  Amputation of the second digit, right foot, and metatarsophalangeal joint.  SURGEON:  B. Theola Sequin, MD  ASSISTANT:  [place].  ANESTHESIA:  MAC with local.  HEMOSTASIS:  Pneumatic ankle tourniquet at 250 mmHg.  ESTIMATED BLOOD LOSS:  Minimal (less than 5 mL).  INJECTABLES:  0.5% Marcaine plain.  PATHOLOGY:  Second digit, right foot.  COMPLICATIONS:  None.  PROCEDURE IN DETAIL:  The patient was brought to the operating room, placed on the operating room table in supine position.  Pneumatic ankle tourniquet was placed about the patient's right ankle.  The foot was then anesthetized using 0.5% Marcaine plain.  The foot was scrubbed, prepped, and draped in usual sterile manner.  The limb was then elevated, exsanguinated, and the pneumatic ankle tourniquet inflated to 250 mmHg.  Attention was directed to the second toe of the right foot where 2 converging semi-elliptical incisions were made, encompassing the second toe in its entirety.  Dissection was continued deep down to the level of the second metatarsophalangeal joint which was disarticulated and the second toe was passed from the operative field.  The wound was inspected, noted to be free of any remaining abnormal soft tissue Pathology.  The head of the second metatarsal appeared healthy with no erosive changes present.  The subcutaneous  structures were reapproximated using 4-0 Vicryl.  Skin was reapproximated using 4-0 Vicryl in a running subcuticular manner.  Incision was reinforced with 4- 0 Prolene in a horizontal mattress and simple suture technique.  A sterile compressive dressing was applied to the operative foot.  The pneumatic ankle tourniquet deflated and a prompt hyperemic response was noted to all remaining digits of the operative foot.          ______________________________ B. Theola Sequin, MD     BIM/MEDQ  D:  12/13/2011  T:  12/14/2011  Job:  [id number]

## 2011-12-17 ENCOUNTER — Encounter (HOSPITAL_COMMUNITY): Payer: Self-pay | Admitting: Podiatry

## 2013-10-01 ENCOUNTER — Encounter (INDEPENDENT_AMBULATORY_CARE_PROVIDER_SITE_OTHER): Payer: Self-pay | Admitting: Internal Medicine

## 2013-10-01 ENCOUNTER — Ambulatory Visit (INDEPENDENT_AMBULATORY_CARE_PROVIDER_SITE_OTHER): Payer: Medicare Other | Admitting: Internal Medicine

## 2013-10-01 VITALS — BP 104/62 | HR 76 | Temp 98.1°F | Ht 61.0 in | Wt 211.7 lb

## 2013-10-01 DIAGNOSIS — K219 Gastro-esophageal reflux disease without esophagitis: Secondary | ICD-10-CM | POA: Diagnosis not present

## 2013-10-01 DIAGNOSIS — G43909 Migraine, unspecified, not intractable, without status migrainosus: Secondary | ICD-10-CM | POA: Insufficient documentation

## 2013-10-01 DIAGNOSIS — R1310 Dysphagia, unspecified: Secondary | ICD-10-CM | POA: Diagnosis not present

## 2013-10-01 DIAGNOSIS — G4730 Sleep apnea, unspecified: Secondary | ICD-10-CM | POA: Insufficient documentation

## 2013-10-01 DIAGNOSIS — E039 Hypothyroidism, unspecified: Secondary | ICD-10-CM | POA: Insufficient documentation

## 2013-10-01 DIAGNOSIS — Z8719 Personal history of other diseases of the digestive system: Secondary | ICD-10-CM | POA: Insufficient documentation

## 2013-10-01 NOTE — Progress Notes (Signed)
Subjective:     Patient ID: [name], female   DOB: 1961/03/01, 53 y.o.   MRN: [id number]  HPI Presents today with c/o dysphagia. She says foods are lodging.  She tells me anything she eats or drinks will lodge. Progressively worsened over the past 2 months. Appetite is not good. She tells me she has nausea all day. She has not lost any weight. She occasionally has acid reflux. She does c/o epigastric pain.  Presently taking Nexium for her acid reflux. She usually has a BM daily. For the past 2 months her stools have been green. Stools are formed. She takes Miralax to prevent constipation.  She occasionally see some blood on the toilet tissue.   05/28/2011 EGD/ED: Dr. Laural Golden: Dysphagia: Impression:  Small sliding hiatal hernia otherwise normal esophagogastroduodenoscopy.  Esophagus dilated by passing 56 French Maloney dilator but no mucosal disruption induced.  01/08/2011 Barium Swallow test: Dysphagia: Post op anterior cervical fusion. No disruption of hardware is seen. No encroachment esophagus is seen.' Slight decrease in effectiveness of esophageal peristalsis in the prone oblique position. When the patient swallowed the 63mm barium sulfate tablet, the tablet passed thru the esophagus into the stomach without evidence of stricture or area of significant narrowing.   Review of Systems Past Medical History  Diagnosis Date  . IBS (irritable bowel syndrome)   . GERD (gastroesophageal reflux disease)   . Migraines   . Sleep apnea   . Back pain   . Hypothyroid   . Osteoarthritis   . Diabetes mellitus     about 8 yrs  . Obesity   . Hypertension     diet control    Past Surgical History  Procedure Laterality Date  . Cholecystectomy    . Hernia repair    . Breast biopsy    . Total abdominal hysterectomy      Left oophroectomy for a lare tumor about 21 yrs. RT ovary removed time of hysterectomy  . Esophagogastroduodenoscopy  05/28/2011    Procedure: ESOPHAGOGASTRODUODENOSCOPY  (EGD);  Surgeon: Rogene Houston, MD;  Location: [facility];  Service: Endoscopy;  Laterality: N/A;  730  . Amputation  12/13/2011    Procedure: AMPUTATION DIGIT;  Surgeon: Marcheta Grammes, DPM;  Location: [facility];  Service: Orthopedics;  Laterality: Right;  amputation second toe right foot  . Neck surgery      2 yrs ago    Allergies  Allergen Reactions  . Cephalexin Hives    unknown  . Latex Rash  . Betadine [Povidone Iodine] Rash  . Cleocin [Clindamycin Hcl] Hives  . Sulfa Antibiotics     Vomiting     Current Outpatient Prescriptions on File Prior to Visit  Medication Sig Dispense Refill  . beta carotene w/minerals (OCUVITE) tablet Take 1 tablet by mouth daily.        . fentaNYL (DURAGESIC - DOSED MCG/HR) 100 MCG/HR Place 1 patch onto the skin every 3 (three) days.        . furosemide (LASIX) 40 MG tablet Take 40 mg by mouth 3 (three) times daily.        Marland Kitchen levothyroxine (SYNTHROID, LEVOTHROID) 175 MCG tablet Take 175 mcg by mouth daily.        . metoprolol tartrate (LOPRESSOR) 25 MG tablet Take 25 mg by mouth 2 (two) times daily.       . Milnacipran (SAVELLA) 50 MG TABS Take 50 mg by mouth 2 (two) times daily.        Marland Kitchen  Multiple Vitamins-Iron (MULTIVITAMINS WITH IRON) TABS Take 1 tablet by mouth daily.      Marland Kitchen NEXIUM 40 MG capsule TAKE 1 CAPSULE BY MOUTH EVERY DAY  30 capsule  11  . polyethylene glycol (MIRALAX / GLYCOLAX) packet Take 17 g by mouth 2 (two) times daily before a meal.        . pregabalin (LYRICA) 100 MG capsule Take 100 mg by mouth 3 (three) times daily.        Marland Kitchen tiZANidine (ZANAFLEX) 4 MG capsule Take 4 mg by mouth every 6 (six) hours.       . topiramate (TOPAMAX) 100 MG tablet Take 100 mg by mouth 2 (two) times daily.        . traMADol (ULTRAM) 50 MG tablet Take 50 mg by mouth at bedtime.       Marland Kitchen zolpidem (AMBIEN) 10 MG tablet Take 10 mg by mouth at bedtime.        No current facility-administered medications on file prior to visit.        Objective:    Physical Exam  Filed Vitals:   10/01/13 1414  BP: 104/62  Pulse: 76  Temp: 98.1 F (36.7 C)  Height: 5\' 1"  (1.549 m)  Weight: 211 lb 11.2 oz (96.026 kg)   Alert and oriented. Skin warm and dry. Oral mucosa is moist.   . Sclera anicteric, conjunctivae is pink. Thyroid not enlarged. No cervical lymphadenopathy. Lungs clear. Heart regular rate and rhythm.  Abdomen is soft. Bowel sounds are positive. No hepatomegaly. No abdominal masses felt. No tenderness.  2-3+ edema to lower extremities with brownish pigmentation noted.    Assessment:     Solid food dysphagia. Hx of same in past.    Plan:    EGD/ED.The risks and benefits such as perforation, bleeding, and infection were reviewed with the patient and is agreeable.

## 2013-10-01 NOTE — Patient Instructions (Signed)
EGD/ED. The risks and benefits such as perforation, bleeding, and infection were reviewed with the patient and is agreeable. 

## 2013-10-06 ENCOUNTER — Other Ambulatory Visit (INDEPENDENT_AMBULATORY_CARE_PROVIDER_SITE_OTHER): Payer: Self-pay | Admitting: *Deleted

## 2013-10-06 DIAGNOSIS — R1310 Dysphagia, unspecified: Secondary | ICD-10-CM

## 2013-10-07 ENCOUNTER — Encounter (HOSPITAL_COMMUNITY): Payer: Self-pay | Admitting: Pharmacy Technician

## 2013-10-09 ENCOUNTER — Encounter (HOSPITAL_COMMUNITY): Payer: Self-pay | Admitting: *Deleted

## 2013-10-09 ENCOUNTER — Ambulatory Visit (HOSPITAL_COMMUNITY)
Admission: RE | Admit: 2013-10-09 | Discharge: 2013-10-09 | Disposition: A | Discharge status: Home / Self Care | Payer: Medicare Other | Source: Ambulatory Visit | Attending: Internal Medicine | Admitting: Internal Medicine

## 2013-10-09 ENCOUNTER — Encounter (HOSPITAL_COMMUNITY)
Admission: RE | Disposition: A | Discharge status: Home / Self Care | Payer: Self-pay | Source: Ambulatory Visit | Attending: Internal Medicine

## 2013-10-09 DIAGNOSIS — Z6839 Body mass index (BMI) 39.0-39.9, adult: Secondary | ICD-10-CM | POA: Insufficient documentation

## 2013-10-09 DIAGNOSIS — K2289 Other specified disease of esophagus: Secondary | ICD-10-CM | POA: Diagnosis not present

## 2013-10-09 DIAGNOSIS — Z791 Long term (current) use of non-steroidal anti-inflammatories (NSAID): Secondary | ICD-10-CM | POA: Insufficient documentation

## 2013-10-09 DIAGNOSIS — M1990 Unspecified osteoarthritis, unspecified site: Secondary | ICD-10-CM | POA: Insufficient documentation

## 2013-10-09 DIAGNOSIS — K228 Other specified diseases of esophagus: Secondary | ICD-10-CM | POA: Diagnosis not present

## 2013-10-09 DIAGNOSIS — E669 Obesity, unspecified: Secondary | ICD-10-CM | POA: Insufficient documentation

## 2013-10-09 DIAGNOSIS — R1310 Dysphagia, unspecified: Secondary | ICD-10-CM | POA: Insufficient documentation

## 2013-10-09 DIAGNOSIS — Z79899 Other long term (current) drug therapy: Secondary | ICD-10-CM | POA: Diagnosis not present

## 2013-10-09 DIAGNOSIS — K219 Gastro-esophageal reflux disease without esophagitis: Secondary | ICD-10-CM

## 2013-10-09 DIAGNOSIS — E039 Hypothyroidism, unspecified: Secondary | ICD-10-CM | POA: Insufficient documentation

## 2013-10-09 DIAGNOSIS — I10 Essential (primary) hypertension: Secondary | ICD-10-CM | POA: Insufficient documentation

## 2013-10-09 DIAGNOSIS — Z7982 Long term (current) use of aspirin: Secondary | ICD-10-CM | POA: Insufficient documentation

## 2013-10-09 HISTORY — PX: MALONEY DILATION: SHX5535

## 2013-10-09 HISTORY — PX: ESOPHAGOGASTRODUODENOSCOPY: SHX5428

## 2013-10-09 SURGERY — EGD (ESOPHAGOGASTRODUODENOSCOPY)
Anesthesia: Moderate Sedation

## 2013-10-09 MED ORDER — MEPERIDINE HCL 50 MG/ML IJ SOLN
INTRAMUSCULAR | Status: AC
  Filled 2013-10-09: qty 1

## 2013-10-09 MED ORDER — MEPERIDINE HCL 50 MG/ML IJ SOLN
INTRAMUSCULAR | Status: DC | PRN
  Administered 2013-10-09 (×2): 25 mg via INTRAVENOUS

## 2013-10-09 MED ORDER — BUTAMBEN-TETRACAINE-BENZOCAINE 2-2-14 % EX AERO
INHALATION_SPRAY | CUTANEOUS | Status: DC | PRN
  Administered 2013-10-09: 2 via TOPICAL

## 2013-10-09 MED ORDER — SODIUM CHLORIDE 0.9 % IV SOLN
INTRAVENOUS | Status: DC
  Administered 2013-10-09: 07:00:00 via INTRAVENOUS

## 2013-10-09 MED ORDER — MIDAZOLAM HCL 5 MG/5ML IJ SOLN
INTRAMUSCULAR | Status: AC
  Filled 2013-10-09: qty 10

## 2013-10-09 MED ORDER — MIDAZOLAM HCL 5 MG/5ML IJ SOLN
INTRAMUSCULAR | Status: DC | PRN
  Administered 2013-10-09: 2 mg via INTRAVENOUS
  Administered 2013-10-09 (×2): 3 mg via INTRAVENOUS
  Administered 2013-10-09: 2 mg via INTRAVENOUS

## 2013-10-09 MED ORDER — STERILE WATER FOR IRRIGATION IR SOLN
Status: DC | PRN
  Administered 2013-10-09: 07:00:00

## 2013-10-09 NOTE — H&P (Signed)
[name] is an 53 y.o. female.   Chief Complaint: Patient is here for EGD and ED. HPI: Patient is-year-old Caucasian female with multiple medical problems including chronic GERD who presents with dysphagia to solids. Heartburn is well controlled with therapy. Her esophagus was last dilated in March 2013 with symptomatic relief until 2 months ago. [name] has difficulty with solids. At times [name] struggles with liquids. [name] [name] is edentulous. [name] does not have dentures. [name] blends food or chops into small pieces before [name] tries to swallow. [name] is on diclofenac daily. [name] denies melena or rectal bleeding. Her weight has been stable.  Past Medical History  Diagnosis Date  . IBS (irritable bowel syndrome)   . GERD (gastroesophageal reflux disease)   . Migraines   . Sleep apnea   . Back pain   . Hypothyroid   . Osteoarthritis   . Obesity   . Hypertension     diet control    Past Surgical History  Procedure Laterality Date  . Cholecystectomy    . Hernia repair    . Breast biopsy    . Total abdominal hysterectomy      Left oophroectomy for a lare tumor about 21 yrs. RT ovary removed time of hysterectomy  . Esophagogastroduodenoscopy  05/28/2011    Procedure: ESOPHAGOGASTRODUODENOSCOPY (EGD);  Surgeon: Rogene Houston, MD;  Location: [facility];  Service: Endoscopy;  Laterality: N/A;  730  . Amputation  12/13/2011    Procedure: AMPUTATION DIGIT;  Surgeon: Marcheta Grammes, DPM;  Location: [facility];  Service: Orthopedics;  Laterality: Right;  amputation second toe right foot  . Neck surgery      2 yrs ago    History reviewed. No pertinent family history. Social History:  reports that [name] has been smoking.  [name] does not have any smokeless tobacco history on file. [name] reports that [name] does not drink alcohol or use illicit drugs.  Allergies:  Allergies  Allergen Reactions  . Cephalexin Hives    unknown  . Latex Rash  . Betadine [Povidone Iodine] Rash  . Cleocin [Clindamycin  Hcl] Hives  . Sulfa Antibiotics     Vomiting     Medications Prior to Admission  Medication Sig Dispense Refill  . aspirin EC 81 MG tablet Take 81 mg by mouth daily.      . beta carotene w/minerals (OCUVITE) tablet Take 1 tablet by mouth daily.        . diclofenac (VOLTAREN) 25 MG EC tablet Take 25 mg by mouth 2 (two) times daily.      Marland Kitchen esomeprazole (NEXIUM) 40 MG capsule Take 40 mg by mouth daily at 12 noon.      . furosemide (LASIX) 40 MG tablet Take 40 mg by mouth 3 (three) times daily.        Marland Kitchen levothyroxine (SYNTHROID, LEVOTHROID) 137 MCG tablet Take 137 mcg by mouth daily before breakfast.      . LORazepam (ATIVAN) 0.5 MG tablet Take 0.5 mg by mouth every 8 (eight) hours.      . metoprolol tartrate (LOPRESSOR) 25 MG tablet Take 25 mg by mouth 2 (two) times daily.       . Milnacipran (SAVELLA) 50 MG TABS tablet Take 50 mg by mouth 2 (two) times daily.      . Multiple Vitamins-Iron (MULTIVITAMINS WITH IRON) TABS Take 1 tablet by mouth daily.      . Oxycodone HCl 10 MG TABS Take 10 mg by mouth every 4 (four)  hours as needed. pain      . oxyCODONE-acetaminophen (PERCOCET) 10-325 MG per tablet Take 2 tablets by mouth every 4 (four) hours as needed for pain.      . polyethylene glycol (MIRALAX / GLYCOLAX) packet Take 17 g by mouth 2 (two) times daily before a meal.        . predniSONE (DELTASONE) 5 MG tablet Take 5 mg by mouth daily with breakfast.      . pregabalin (LYRICA) 100 MG capsule Take 100 mg by mouth 3 (three) times daily.        Marland Kitchen tiZANidine (ZANAFLEX) 4 MG capsule Take 4 mg by mouth every 6 (six) hours.       . topiramate (TOPAMAX) 100 MG tablet Take 100 mg by mouth 2 (two) times daily.        . traMADol (ULTRAM) 50 MG tablet Take 50 mg by mouth at bedtime.       . traZODone (DESYREL) 50 MG tablet Take 50 mg by mouth at bedtime.      Marland Kitchen zolpidem (AMBIEN) 10 MG tablet Take 10 mg by mouth at bedtime.       . fentaNYL (DURAGESIC - DOSED MCG/HR) 100 MCG/HR Place 1 patch onto the  skin every 3 (three) days.          No results found for this or any previous visit (from the past 48 hour(s)). No results found.  ROS  Blood pressure 145/85, pulse 82, temperature 98.3 F (36.8 C), temperature source Oral, resp. rate 15, height 5\' 1"  (1.549 m), weight 211 lb (95.709 kg), SpO2 98.00%. Physical Exam  Constitutional: [name] appears well-developed and well-nourished.  HENT:  Mouth/Throat: Oropharynx is clear and moist.  Patient is  edentulous  Eyes: Conjunctivae are normal. No scleral icterus.  Neck: No thyromegaly present.   Scar over low anterior neck to the left of midline  Cardiovascular: Normal rate, regular rhythm and normal heart sounds.   Respiratory: Effort normal and breath sounds normal.  GI: Soft. [name] exhibits no mass. Tenderness: mild generalized tenderness. There is no rebound.  Musculoskeletal: [name] exhibits no edema.  Lymphadenopathy:    [name] has no cervical adenopathy.  Neurological: [name] is alert.  Skin: Skin is warm and dry.     Assessment/Plan Solid food dysphagia. Chronic GERD. EGD with ED  Karoline Fleer U 10/09/2013, 7:33 AM

## 2013-10-09 NOTE — Discharge Instructions (Signed)
Resume usual medications and usual diet. No driving for 24 hours. Physician will call with biopsy results.  Dysphagia Swallowing problems (dysphagia) occur when solids and liquids seem to stick in your throat on the way down to your stomach, or the food takes longer to get to the stomach. Other symptoms include regurgitating food, noises coming from the throat, chest discomfort with swallowing, and a feeling of fullness or the feeling of something being stuck in your throat when swallowing. When blockage in your throat is complete it may be associated with drooling. CAUSES  Problems with swallowing may occur because of problems with the muscles. The food cannot be propelled in the usual manner into your stomach. You may have ulcers, scar tissue, or inflammation in the tube down which food travels from your mouth to your stomach (esophagus), which blocks food from passing normally into the stomach. Causes of inflammation include:  Acid reflux from your stomach into your esophagus.  Infection.  Radiation treatment for cancer.  Medicines taken without enough fluids to wash them down into your stomach. You may have nerve problems that prevent signals from being sent to the muscles of your esophagus to contract and move your food down to your stomach. Globus pharyngeus is a relatively common problem in which there is a sense of an obstruction or difficulty in swallowing, without any physical abnormalities of the swallowing passages being found. This problem usually improves over time with reassurance and testing to rule out other causes. DIAGNOSIS Dysphagia can be diagnosed and its cause can be determined by tests in which you swallow a white substance that helps illuminate the inside of your throat (contrast medium) while X-rays are taken. Sometimes a flexible telescope that is inserted down your throat (endoscopy) to look at your esophagus and stomach is used. TREATMENT   If the dysphagia is caused  by acid reflux or infection, medicines may be used.  If the dysphagia is caused by problems with your swallowing muscles, swallowing therapy may be used to help you strengthen your swallowing muscles.  If the dysphagia is caused by a blockage or mass, procedures to remove the blockage may be done. HOME CARE INSTRUCTIONS  Try to eat soft food that is easier to swallow and check your weight on a daily basis to be sure that it is not decreasing.  Be sure to drink liquids when sitting upright (not lying down). SEEK MEDICAL CARE IF:  You are losing weight because you are unable to swallow.  You are coughing when you drink liquids (aspiration).  You are coughing up partially digested food. SEEK IMMEDIATE MEDICAL CARE IF:  You are unable to swallow your own saliva .  You are having shortness of breath or a fever, or both.  You have a hoarse voice along with difficulty swallowing. MAKE SURE YOU:  Understand these instructions.  Will watch your condition.  Will get help right away if you are not doing well or get worse. Document Released: 03/16/2000 Document Revised: 11/19/2012 Document Reviewed: 09/05/2012 [facility] Patient Information 2015 [place], [place]. This information is not intended to replace advice given to you by your health care provider. Make sure you discuss any questions you have with your health care provider. Esophagogastroduodenoscopy Care After Refer to this sheet in the next few weeks. These instructions provide you with information on caring for yourself after your procedure. Your caregiver may also give you more specific instructions. Your treatment has been planned according to current medical practices, but problems sometimes occur.  Call your caregiver if you have any problems or questions after your procedure.  HOME CARE INSTRUCTIONS  Do not eat or drink anything until the numbing medicine (local anesthetic) has worn off and your gag reflex has returned. You will  know that the local anesthetic has worn off when you can swallow comfortably.  Do not drive for 12 hours after the procedure or as directed by your caregiver.  Only take medicines as directed by your caregiver. SEEK MEDICAL CARE IF:   You cannot stop coughing.  You are not urinating at all or less than usual. SEEK IMMEDIATE MEDICAL CARE IF:  You have difficulty swallowing.  You cannot eat or drink.  You have worsening throat or chest pain.  You have dizziness, lightheadedness, or you faint.  You have nausea or vomiting.  You have chills.  You have a fever.  You have severe abdominal pain.  You have black, tarry, or bloody stools. Document Released: 03/05/2012 Document Reviewed: 03/05/2012 [facility] Patient Information 2015 [place]. This information is not intended to replace advice given to you by your health care provider. Make sure you discuss any questions you have with your health care provider.

## 2013-10-09 NOTE — Op Note (Signed)
EGD PROCEDURE REPORT  PATIENT:  [name]  MR#:  [id number] Birthdate:  1960/12/16, 53 y.o., female Endoscopist:  Dr. Rogene Houston, MD Referred By:  Dr. Zella Richer. Scotty Court, MD  Procedure Date: 10/09/2013  Procedure:   EGD with ED.  Indications:  Patient is 53 year old Caucasian female with multiple medical problems as well as chronic GERD who presents with dysphagia primarily to solids. Her esophagus was last dilated in March 2013 with symptomatic relief until a few months ago. Patient states heartburn uncontrolled with therapy.            Informed Consent:  The risks, benefits, alternatives & imponderables which include, but are not limited to, bleeding, infection, perforation, drug reaction and potential missed lesion have been reviewed.  The potential for biopsy, lesion removal, esophageal dilation, etc. have also been discussed.  Questions have been answered.  All parties agreeable.  Please see history & physical in medical record for more information.  Medications:  Demerol 50 mg IV Versed 10 mg IV Cetacaine spray topically for oropharyngeal anesthesia  Description of procedure:  The endoscope was introduced through the mouth and advanced to the second portion of the duodenum without difficulty or limitations. The mucosal surfaces were surveyed very carefully during advancement of the scope and upon withdrawal.  Findings:  Esophagus:  Mucosa of the esophagus revealed coarse appearance but no furrows or rings were noted. GE junction was unremarkable without ring or stricture formation. GEJ:  40 cm Stomach:  Stomach was empty and distended very well with insufflation. Folds in the proximal stomach were normal. Examination of mucosa at gastric body, antrum, pyloric channel, angularis, fundus and cardia was normal. Duodenum:  Normal bulbar and post bulbar mucosa.  Therapeutic/Diagnostic Maneuvers Performed:   Esophagus was dilated by passing 54 [place] Maloney dilator to full  insertion. Esophageal mucosa was reexamined postovulation and no mucosal destruction noted. Multiple biopsies taken from mucosa at esophageal body for routine histology to  Complications:  None  Impression: Coarse appearance esophageal mucosa but no evidence of ring or stricture formation. No evidence of peptic ulcer disease or gastritis. Esophagus dilated by passing 8 [place] Maloney dilator. Biopsy taken from the sufficient mucosa to rule out eosinophilic esophagitis.  Recommendations:  Standard instructions given. I will contact patient with biopsy results.  [name]  10/09/2013  8:10 AM  CC: Dr. Deloria Lair, MD & Dr. Rayne Du ref. provider found

## 2013-10-12 ENCOUNTER — Encounter (HOSPITAL_COMMUNITY): Payer: Self-pay | Admitting: Internal Medicine

## 2013-10-30 ENCOUNTER — Encounter (INDEPENDENT_AMBULATORY_CARE_PROVIDER_SITE_OTHER): Payer: Self-pay | Admitting: *Deleted

## 2015-05-09 ENCOUNTER — Telehealth: Payer: Self-pay | Admitting: Orthopaedic Surgery

## 2015-05-09 NOTE — Telephone Encounter (Signed)
Patient asking for refill of Oxycodone

## 2015-05-10 ENCOUNTER — Telehealth: Payer: Self-pay

## 2015-05-10 MED ORDER — OXYCODONE HCL 10 MG PO TABS: 10.0000 mg | ORAL_TABLET | ORAL | Status: DC | PRN

## 2015-05-10 NOTE — Telephone Encounter (Signed)
Rx printed

## 2015-05-10 NOTE — Telephone Encounter (Signed)
Patient aware her rx is ready for pick up.

## 2015-05-26 ENCOUNTER — Ambulatory Visit (INDEPENDENT_AMBULATORY_CARE_PROVIDER_SITE_OTHER): Payer: Medicare Other | Admitting: Orthopaedic Surgery

## 2015-05-26 ENCOUNTER — Encounter: Payer: Self-pay | Admitting: Orthopaedic Surgery

## 2015-05-26 VITALS — BP 127/80 | HR 66 | Temp 97.7°F | Resp 16 | Ht 61.0 in | Wt 196.0 lb

## 2015-05-26 DIAGNOSIS — M545 Low back pain, unspecified: Secondary | ICD-10-CM

## 2015-05-26 DIAGNOSIS — M25511 Pain in right shoulder: Secondary | ICD-10-CM

## 2015-05-26 MED ORDER — OXYCODONE-ACETAMINOPHEN 10-325 MG PO TABS: ORAL_TABLET | ORAL | Status: DC

## 2015-05-26 NOTE — Progress Notes (Addendum)
Patient [id number] [name], female DOB:October 10, 1960, 55 y.o. [id number]  Chief Complaint  Patient presents with  . Follow-up    follow up back, neck, shoulders, knees    HPI  [name] is a 55 y.o. female who has chronic pain of both shoulders, both knees and the lower back.  She has no new trauma.  She has no giving way of the knees, no locking of the knees but has swelling and popping of both knees.  The right shoulder is more painful today.  She has no redness or paresthesias of the right shoulder or arm  She has no trauma.  Her lower back is chronically tender with no paresthesias.  She is very tired today.  She is taking her medicine.  She does not do the exercises.   Back Pain This is a chronic problem. The current episode started more than 1 year ago. The problem occurs daily. The problem has been gradually worsening since onset. The pain is present in the lumbar spine and thoracic spine. The quality of the pain is described as aching and shooting. The pain is at a severity of 4/10. The pain is moderate. The pain is worse during the day. The symptoms are aggravated by bending, standing, stress and twisting. Associated symptoms include headaches. Pertinent negatives include no chest pain. She has tried heat, ice and NSAIDs for the symptoms. The treatment provided mild relief.    Body mass index is 37.05 kg/(m^2).   Review of Systems  Constitutional: Positive for fatigue.       Patient does not have Diabetes Mellitus. Patient has hypertension. Patient has COPD or shortness of breath. Patient has BMI > 35. Patient has current smoking history.  HENT: Negative for congestion.   Respiratory: Positive for cough and choking.        Sleep apnea  Cardiovascular: Positive for leg swelling. Negative for chest pain.  Gastrointestinal:       GERD  Endocrine: Positive for cold intolerance.  Musculoskeletal: Positive for myalgias, back pain, joint swelling, arthralgias and gait problem.   Allergic/Immunologic: Positive for environmental allergies.  Neurological: Positive for headaches.  Psychiatric/Behavioral: The patient is nervous/anxious.     Past Medical History  Diagnosis Date  . IBS (irritable bowel syndrome)   . GERD (gastroesophageal reflux disease)   . Migraines   . Sleep apnea   . Back pain   . Hypothyroid   . Osteoarthritis   . Obesity   . Hypertension     diet control    Past Surgical History  Procedure Laterality Date  . Cholecystectomy    . Hernia repair    . Breast biopsy    . Total abdominal hysterectomy      Left oophroectomy for a lare tumor about 21 yrs. RT ovary removed time of hysterectomy  . Esophagogastroduodenoscopy  05/28/2011    Procedure: ESOPHAGOGASTRODUODENOSCOPY (EGD);  Surgeon: Rogene Houston, MD;  Location: [facility];  Service: Endoscopy;  Laterality: N/A;  730  . Amputation  12/13/2011    Procedure: AMPUTATION DIGIT;  Surgeon: Marcheta Grammes, DPM;  Location: [facility];  Service: Orthopedics;  Laterality: Right;  amputation second toe right foot  . Neck surgery      2 yrs ago  . Esophagogastroduodenoscopy N/A 10/09/2013    Procedure: ESOPHAGOGASTRODUODENOSCOPY (EGD);  Surgeon: Rogene Houston, MD;  Location: [facility];  Service: Endoscopy;  Laterality: N/A;  730  . Maloney dilation N/A 10/09/2013    Procedure: MALONEY DILATION;  Surgeon: Rogene Houston, MD;  Location: [facility];  Service: Endoscopy;  Laterality: N/A;    No family history on file.  Social History Social History  Substance Use Topics  . Smoking status: Current Every Day Smoker -- 0.50 packs/day for 35 years  . Smokeless tobacco: None     Comment: 1 pack a day since age18  . Alcohol Use: No    Allergies  Allergen Reactions  . Cephalexin Hives    unknown  . Latex Rash  . Betadine [Povidone Iodine] Rash  . Cleocin [Clindamycin Hcl] Hives  . Sulfa Antibiotics     Vomiting     Current Outpatient Prescriptions  Medication Sig  Dispense Refill  . esomeprazole (NEXIUM) 40 MG capsule Take 40 mg by mouth daily at 12 noon.    . fentaNYL (DURAGESIC - DOSED MCG/HR) 100 MCG/HR Place 1 patch onto the skin every 3 (three) days.      . furosemide (LASIX) 40 MG tablet Take 40 mg by mouth 3 (three) times daily.      Marland Kitchen levothyroxine (SYNTHROID, LEVOTHROID) 137 MCG tablet Take 137 mcg by mouth daily before breakfast.    . metoprolol tartrate (LOPRESSOR) 25 MG tablet Take 25 mg by mouth 2 (two) times daily.     . Milnacipran (SAVELLA) 50 MG TABS tablet Take 50 mg by mouth 2 (two) times daily.    . Multiple Vitamins-Iron (MULTIVITAMINS WITH IRON) TABS Take 1 tablet by mouth daily.    . polyethylene glycol (MIRALAX / GLYCOLAX) packet Take 17 g by mouth 2 (two) times daily before a meal.      . pregabalin (LYRICA) 100 MG capsule Take 100 mg by mouth 3 (three) times daily.      Marland Kitchen tiZANidine (ZANAFLEX) 4 MG capsule Take 4 mg by mouth every 6 (six) hours.     . topiramate (TOPAMAX) 100 MG tablet Take 100 mg by mouth 2 (two) times daily.      . traZODone (DESYREL) 50 MG tablet Take 50 mg by mouth at bedtime.    Marland Kitchen zolpidem (AMBIEN) 10 MG tablet Take 10 mg by mouth at bedtime.     Marland Kitchen aspirin EC 81 MG tablet Take 81 mg by mouth daily.    . beta carotene w/minerals (OCUVITE) tablet Take 1 tablet by mouth daily.      . diclofenac (VOLTAREN) 25 MG EC tablet Take 25 mg by mouth 2 (two) times daily.    Marland Kitchen LORazepam (ATIVAN) 0.5 MG tablet Take 0.5 mg by mouth every 8 (eight) hours.    . Oxycodone HCl 10 MG TABS Take 1 tablet (10 mg total) by mouth every 4 (four) hours as needed (Must last 30 days.). pain 180 tablet 0  . oxyCODONE-acetaminophen (PERCOCET) 10-325 MG tablet One every four hours as needed for pain.  Must last 30 days. 180 tablet 0  . predniSONE (DELTASONE) 5 MG tablet Take 5 mg by mouth daily with breakfast.    . traMADol (ULTRAM) 50 MG tablet Take 50 mg by mouth at bedtime.      No current facility-administered medications for this  visit.     Physical Exam  Blood pressure 127/80, pulse 66, temperature 97.7 F (36.5 C), resp. rate 16, height 5\' 1"  (1.549 m), weight 196 lb (88.905 kg).  Constitutional: overall normal hygiene, normal nutrition, well developed, normal grooming, normal body habitus. Assistive device:cane  Musculoskeletal: gait and station Limp left, muscle tone and strength are normal, no tremors or atrophy is  present.  .  Neurological: coordination overall normal.  Deep tendon reflex/nerve stretch intact.  Sensation normal.  Cranial nerves II-XII intact.   Skin:   normal overall no scars, lesions, ulcers or rashes. No psoriasis.  Psychiatric: Alert and oriented x 3.  Recent memory intact, remote memory unclear.  Normal mood and affect. Well groomed.  Good eye contact.  Cardiovascular: overall no swelling, no varicosities, no edema bilaterally, normal temperatures of the legs and arms, no clubbing, cyanosis and good capillary refill.  Lymphatic: palpation is normal.  Examination of right Upper Extremity is done.  Inspection:   Overall:  Elbow non-tender without crepitus or defects, forearm non-tender without crepitus or defects, wrist non-tender without crepitus or defects, hand non-tender.    Shoulder: with glenohumeral joint tenderness, without effusion.   Upper arm: without swelling and tenderness   Range of motion:   Overall:  Full range of motion of the elbow, full range of motion of wrist and full range of motion in fingers.   Shoulder:  right  160 degrees forward flexion; 140 degrees abduction; 30 degrees internal rotation, 30 degrees external rotation, 20 degrees extension, 40 degrees adduction.   Stability:   Overall:  Shoulder, elbow and wrist stable   Strength and Tone:   Overall full shoulder muscles strength, full upper arm strength and normal upper arm bulk and tone.  Spine/Pelvis examination:  Inspection:  Overall, sacoiliac joint benign and hips nontender; without crepitus or  defects.   Thoracic spine inspection: Alignment normal with kyphosis present   Lumbar spine inspection:  Alignment  with normal lumbar lordosis, without scoliosis apparent.   Thoracic spine palpation:  with tenderness of spinal processes   Lumbar spine palpation: with tenderness of lumbar area; without tightness of lumbar muscles    Range of Motion:   Lumbar flexion, forward flexion is 20  with pain or tenderness    Lumbar extension is 5  with pain or tenderness   Left lateral bend is Normal  without pain or tenderness   Right lateral bend is Normal without pain or tenderness   Straight leg raising is Normal   Strength & tone: Normal   Stability overall normal stability    The patient request injection, verbal consent was obtained.  The right shoulder was prepped appropriately after time out was performed.   Sterile technique was observed and injection of 1 cc of Depo-Medrol 40 mg with several cc's of plain xylocaine. Anesthesia was provided by ethyl chloride and a 20-gauge needle was used to inject the shoulder area. A posterior approach was used.  The injection was tolerated well.  A band aid dressing was applied.  The patient was advised to apply ice later today and tomorrow to the injection sight as needed.  I have talked to her about her smoking.  She is trying to cut back but has not.  She says it is too hard to do so.  I have talked about exercises.  She says she is in too much pain and not too thrilled about doing any exercises.  Her blood pressure is well controlled.  She does monitor that.  I have talked about her pain and pain control.  She will have the pharmacist contact me about her medicine again.   The patient has been educated about the nature of the problem(s) and counseled on treatment options.  The patient appeared to understand what I have discussed and is in agreement with it. Encounter Diagnoses  Name Primary?  Marland Kitchen  Right shoulder pain Yes  . Midline  low back pain without sciatica     PLAN Call if any problems.  Precautions discussed.  Continue current medications.   Return to clinic one month

## 2015-05-26 NOTE — Patient Instructions (Addendum)
Joint Injection  Care After  Refer to this sheet in the next few days. These instructions provide you with information on caring for yourself after you have had a joint injection. Your caregiver also may give you more specific instructions. Your treatment has been planned according to current medical practices, but problems sometimes occur. Call your caregiver if you have any problems or questions after your procedure.  After any type of joint injection, it is not uncommon to experience:  Soreness, swelling, or bruising around the injection site.  Mild numbness, tingling, or weakness around the injection site caused by the numbing medicine used before or with the injection. It also is possible to experience the following effects associated with the specific agent after injection:  Iodine-based contrast agents:  Allergic reaction (itching, hives, widespread redness, and swelling beyond the injection site).  Corticosteroids (These effects are rare.):  Allergic reaction.  Increased blood sugar levels (If you have diabetes and you notice that your blood sugar levels have increased, notify your caregiver).  Increased blood pressure levels.  Mood swings.  Hyaluronic acid in the use of viscosupplementation.  Temporary heat or redness.  Temporary rash and itching.  Increased fluid accumulation in the injected joint. These effects all should resolve within a day after your procedure.  HOME CARE INSTRUCTIONS  Limit yourself to light activity the day of your procedure. Avoid lifting heavy objects, bending, stooping, or twisting.  Take prescription or over-the-counter pain medication as directed by your caregiver.  You may apply ice to your injection site to reduce pain and swelling the day of your procedure. Ice may be applied 3-4 times:  Put ice in a plastic bag.  Place a towel between your skin and the bag.  Leave the ice on for no longer than 15-20 minutes each time. SEEK IMMEDIATE MEDICAL CARE  IF:  Pain and swelling get worse rather than better or extend beyond the injection site.  Numbness does not go away.  Blood or fluid continues to leak from the injection site.  You have chest pain.  You have swelling of your face or tongue.  You have trouble breathing or you become dizzy.  You develop a fever, chills, or severe tenderness at the injection site that last longer than 1 day. MAKE SURE YOU:  Understand these instructions.  Watch your condition.  Get help right away if you are not doing well or if you get worse. Document Released: 11/30/2010 Document Revised: 06/11/2011 Document Reviewed: 11/30/2010  [facility] Patient Information 2014 [place].    Smoking Cessation, Tips for Success If you are ready to quit smoking, congratulations! You have chosen to help yourself be healthier. Cigarettes bring nicotine, tar, carbon monoxide, and other irritants into your body. Your lungs, heart, and blood vessels will be able to work better without these poisons. There are many different ways to quit smoking. Nicotine gum, nicotine patches, a nicotine inhaler, or nicotine nasal spray can help with physical craving. Hypnosis, support groups, and medicines help break the habit of smoking. WHAT THINGS CAN I DO TO MAKE QUITTING EASIER?  Here are some tips to help you quit for good:  Pick a date when you will quit smoking completely. Tell all of your friends and family about your plan to quit on that date.  Do not try to slowly cut down on the number of cigarettes you are smoking. Pick a quit date and quit smoking completely starting on that day.  Throw away all cigarettes.   Clean  and remove all ashtrays from your home, work, and car.  On a card, write down your reasons for quitting. Carry the card with you and read it when you get the urge to smoke.  Cleanse your body of nicotine. Drink enough water and fluids to keep your urine clear or pale yellow. Do this after quitting to flush  the nicotine from your body.  Learn to predict your moods. Do not let a bad situation be your excuse to have a cigarette. Some situations in your life might tempt you into wanting a cigarette.  Never have "just one" cigarette. It leads to wanting another and another. Remind yourself of your decision to quit.  Change habits associated with smoking. If you smoked while driving or when feeling stressed, try other activities to replace smoking. Stand up when drinking your coffee. Brush your teeth after eating. Sit in a different chair when you read the paper. Avoid alcohol while trying to quit, and try to drink fewer caffeinated beverages. Alcohol and caffeine may urge you to smoke.  Avoid foods and drinks that can trigger a desire to smoke, such as sugary or spicy foods and alcohol.  Ask people who smoke not to smoke around you.  Have something planned to do right after eating or having a cup of coffee. For example, plan to take a walk or exercise.  Try a relaxation exercise to calm you down and decrease your stress. Remember, you may be tense and nervous for the first 2 weeks after you quit, but this will pass.  Find new activities to keep your hands busy. Play with a pen, coin, or rubber band. Doodle or draw things on paper.  Brush your teeth right after eating. This will help cut down on the craving for the taste of tobacco after meals. You can also try mouthwash.   Use oral substitutes in place of cigarettes. Try using lemon drops, carrots, cinnamon sticks, or chewing gum. Keep them handy so they are available when you have the urge to smoke.  When you have the urge to smoke, try deep breathing.  Designate your home as a nonsmoking area.  If you are a heavy smoker, ask your health care provider about a prescription for nicotine chewing gum. It can ease your withdrawal from nicotine.  Reward yourself. Set aside the cigarette money you save and buy yourself something nice.  Look for  support from others. Join a support group or smoking cessation program. Ask someone at home or at work to help you with your plan to quit smoking.  Always ask yourself, "Do I need this cigarette or is this just a reflex?" Tell yourself, "Today, I choose not to smoke," or "I do not want to smoke." You are reminding yourself of your decision to quit.  Do not replace cigarette smoking with electronic cigarettes (commonly called e-cigarettes). The safety of e-cigarettes is unknown, and some may contain harmful chemicals.  If you relapse, do not give up! Plan ahead and think about what you will do the next time you get the urge to smoke. HOW WILL I FEEL WHEN I QUIT SMOKING? You may have symptoms of withdrawal because your body is used to nicotine (the addictive substance in cigarettes). You may crave cigarettes, be irritable, feel very hungry, cough often, get headaches, or have difficulty concentrating. The withdrawal symptoms are only temporary. They are strongest when you first quit but will go away within 10-14 days. When withdrawal symptoms occur, stay in control. Think  about your reasons for quitting. Remind yourself that these are signs that your body is healing and getting used to being without cigarettes. Remember that withdrawal symptoms are easier to treat than the major diseases that smoking can cause.  Even after the withdrawal is over, expect periodic urges to smoke. However, these cravings are generally short lived and will go away whether you smoke or not. Do not smoke! WHAT RESOURCES ARE AVAILABLE TO HELP ME QUIT SMOKING? Your health care provider can direct you to community resources or hospitals for support, which may include:  Group support.  Education.  Hypnosis.  Therapy.   This information is not intended to replace advice given to you by your health care provider. Make sure you discuss any questions you have with your health care provider.   Document Released: 12/16/2003  Document Revised: 04/09/2014 Document Reviewed: 09/04/2012 Elsevier Interactive Patient Education [employer].

## 2015-06-06 ENCOUNTER — Telehealth: Payer: Self-pay | Admitting: Orthopaedic Surgery

## 2015-06-06 NOTE — Telephone Encounter (Signed)
Patient has question about medication

## 2015-06-07 ENCOUNTER — Other Ambulatory Visit (HOSPITAL_COMMUNITY): Payer: Self-pay | Admitting: Urology

## 2015-06-07 ENCOUNTER — Telehealth: Payer: Self-pay | Admitting: Orthopaedic Surgery

## 2015-06-07 DIAGNOSIS — R918 Other nonspecific abnormal finding of lung field: Secondary | ICD-10-CM

## 2015-06-07 MED ORDER — OXYCODONE HCL 10 MG PO TABS: 10.0000 mg | ORAL_TABLET | ORAL | Status: DC | PRN

## 2015-06-07 NOTE — Telephone Encounter (Signed)
Rx done. 

## 2015-06-07 NOTE — Telephone Encounter (Signed)
Patient requesting refill on Oxycodone 10 mg  Qty 180 Tablets

## 2015-06-16 ENCOUNTER — Encounter (HOSPITAL_COMMUNITY)
Admission: RE | Admit: 2015-06-16 | Discharge: 2015-06-16 | Disposition: A | Discharge status: Home / Self Care | Payer: Medicare Other | Source: Ambulatory Visit | Attending: Urology | Admitting: Urology

## 2015-06-16 DIAGNOSIS — R918 Other nonspecific abnormal finding of lung field: Secondary | ICD-10-CM

## 2015-06-16 LAB — GLUCOSE, CAPILLARY: GLUCOSE-CAPILLARY: 93 mg/dL (ref 65–99)

## 2015-06-16 MED ORDER — FLUDEOXYGLUCOSE F - 18 (FDG) INJECTION
10.1000 | Freq: Once | INTRAVENOUS | Status: AC | PRN
  Administered 2015-06-16: 10.1 via INTRAVENOUS

## 2015-06-23 ENCOUNTER — Ambulatory Visit (INDEPENDENT_AMBULATORY_CARE_PROVIDER_SITE_OTHER): Payer: Medicare Other | Admitting: Orthopaedic Surgery

## 2015-06-23 ENCOUNTER — Encounter: Payer: Self-pay | Admitting: Orthopaedic Surgery

## 2015-06-23 VITALS — BP 123/76 | HR 81 | Resp 16 | Ht 61.0 in | Wt 198.0 lb

## 2015-06-23 DIAGNOSIS — M545 Low back pain, unspecified: Secondary | ICD-10-CM

## 2015-06-23 DIAGNOSIS — M25512 Pain in left shoulder: Secondary | ICD-10-CM | POA: Diagnosis not present

## 2015-06-23 MED ORDER — OXYCODONE-ACETAMINOPHEN 10-325 MG PO TABS: ORAL_TABLET | ORAL | Status: DC

## 2015-06-23 NOTE — Progress Notes (Signed)
Patient [id number] [name], female DOB:July 08, 1960, 55 y.o. [id number]  Chief Complaint  Patient presents with  . Follow-up    FOLLOW UP BACK PAIN "worse"    HPI  [name] is a 55 y.o. female who has chronic lower back pain that is a little worse today. She has no paresthesias.  She has a new problem of pain in the left shoulder.  It hurts with overhead use.  She has no trauma, no paresthesias, no redness, no swelling.   Back Pain This is a chronic problem. The current episode started more than 1 year ago. The problem occurs daily. The problem has been gradually worsening since onset. The pain is present in the lumbar spine. The quality of the pain is described as aching and burning. The pain does not radiate. The pain is at a severity of 5/10. The pain is moderate. The pain is worse during the day. The symptoms are aggravated by bending, sitting, standing, twisting and stress. Associated symptoms include headaches. Pertinent negatives include no chest pain. She has tried analgesics, bed rest, heat, home exercises, ice, muscle relaxant, NSAIDs and walking for the symptoms. The treatment provided mild relief.  Shoulder Pain  The pain is present in the left shoulder. This is a recurrent problem. The current episode started 1 to 4 weeks ago. There has been no history of extremity trauma. The problem occurs daily. The problem has been gradually worsening. The quality of the pain is described as aching and burning. The pain is at a severity of 5/10. The pain is moderate. She has tried acetaminophen, cold, heat, movement, NSAIDS, oral narcotics and rest for the symptoms. The treatment provided mild relief.    Body mass index is 37.43 kg/(m^2).   Review of Systems  Constitutional:       Patient does not have Diabetes Mellitus. Patient has hypertension. Patient has COPD or shortness of breath. Patient has BMI > 35. Patient has current smoking history.  HENT: Negative for congestion.    Respiratory: Positive for cough and shortness of breath.        Sleep apnea  Cardiovascular: Negative for chest pain.  Gastrointestinal:       GERD  Endocrine: Positive for cold intolerance.  Musculoskeletal: Positive for back pain and arthralgias.  Allergic/Immunologic: Positive for environmental allergies.  Neurological: Positive for headaches.    Past Medical History  Diagnosis Date  . IBS (irritable bowel syndrome)   . GERD (gastroesophageal reflux disease)   . Migraines   . Sleep apnea   . Back pain   . Hypothyroid   . Osteoarthritis   . Obesity   . Hypertension     diet control    Past Surgical History  Procedure Laterality Date  . Cholecystectomy    . Hernia repair    . Breast biopsy    . Total abdominal hysterectomy      Left oophroectomy for a lare tumor about 21 yrs. RT ovary removed time of hysterectomy  . Esophagogastroduodenoscopy  05/28/2011    Procedure: ESOPHAGOGASTRODUODENOSCOPY (EGD);  Surgeon: Rogene Houston, MD;  Location: [facility];  Service: Endoscopy;  Laterality: N/A;  730  . Amputation  12/13/2011    Procedure: AMPUTATION DIGIT;  Surgeon: Marcheta Grammes, DPM;  Location: [facility];  Service: Orthopedics;  Laterality: Right;  amputation second toe right foot  . Neck surgery      2 yrs ago  . Esophagogastroduodenoscopy N/A 10/09/2013    Procedure: ESOPHAGOGASTRODUODENOSCOPY (EGD);  Surgeon:  Rogene Houston, MD;  Location: [facility];  Service: Endoscopy;  Laterality: N/A;  730  . Maloney dilation N/A 10/09/2013    Procedure: Venia Minks DILATION;  Surgeon: Rogene Houston, MD;  Location: [facility];  Service: Endoscopy;  Laterality: N/A;    No family history on file.  Social History Social History  Substance Use Topics  . Smoking status: Current Every Day Smoker -- 0.50 packs/day for 35 years  . Smokeless tobacco: None     Comment: 1 pack a day since age18  . Alcohol Use: No    Allergies  Allergen Reactions  . Cephalexin Hives     unknown  . Latex Rash  . Betadine [Povidone Iodine] Rash  . Cleocin [Clindamycin Hcl] Hives  . Sulfa Antibiotics     Vomiting     Current Outpatient Prescriptions  Medication Sig Dispense Refill  . aspirin EC 81 MG tablet Take 81 mg by mouth daily.    . beta carotene w/minerals (OCUVITE) tablet Take 1 tablet by mouth daily.      . diclofenac (VOLTAREN) 25 MG EC tablet Take 25 mg by mouth 2 (two) times daily.    Marland Kitchen esomeprazole (NEXIUM) 40 MG capsule Take 40 mg by mouth daily at 12 noon.    . fentaNYL (DURAGESIC - DOSED MCG/HR) 100 MCG/HR Place 1 patch onto the skin every 3 (three) days.      . furosemide (LASIX) 40 MG tablet Take 40 mg by mouth 3 (three) times daily.      Marland Kitchen levothyroxine (SYNTHROID, LEVOTHROID) 137 MCG tablet Take 137 mcg by mouth daily before breakfast.    . LORazepam (ATIVAN) 0.5 MG tablet Take 0.5 mg by mouth every 8 (eight) hours.    . metoprolol tartrate (LOPRESSOR) 25 MG tablet Take 25 mg by mouth 2 (two) times daily.     . Milnacipran (SAVELLA) 50 MG TABS tablet Take 50 mg by mouth 2 (two) times daily.    . Multiple Vitamins-Iron (MULTIVITAMINS WITH IRON) TABS Take 1 tablet by mouth daily.    . Oxycodone HCl 10 MG TABS Take 1 tablet (10 mg total) by mouth every 4 (four) hours as needed (Must last 30 days.). pain 180 tablet 0  . oxyCODONE-acetaminophen (PERCOCET) 10-325 MG tablet One every four hours as needed for pain.  Must last 30 days. 180 tablet 0  . polyethylene glycol (MIRALAX / GLYCOLAX) packet Take 17 g by mouth 2 (two) times daily before a meal.      . pregabalin (LYRICA) 100 MG capsule Take 100 mg by mouth 3 (three) times daily.      Marland Kitchen tiZANidine (ZANAFLEX) 4 MG capsule Take 4 mg by mouth every 6 (six) hours.     . topiramate (TOPAMAX) 100 MG tablet Take 100 mg by mouth 2 (two) times daily.      . traMADol (ULTRAM) 50 MG tablet Take 50 mg by mouth at bedtime.     . traZODone (DESYREL) 50 MG tablet Take 50 mg by mouth at bedtime.    Marland Kitchen zolpidem (AMBIEN)  10 MG tablet Take 10 mg by mouth at bedtime.      No current facility-administered medications for this visit.     Physical Exam  Blood pressure 123/76, pulse 81, resp. rate 16, height 5\' 1"  (1.549 m), weight 198 lb (89.812 kg).  Constitutional: overall normal hygiene, normal nutrition, well developed, normal grooming, normal body habitus. Assistive device:cane  Musculoskeletal: gait and station Limp left, muscle tone and  strength are normal, no tremors or atrophy is present.  .  Neurological: coordination overall normal.  Deep tendon reflex/nerve stretch intact.  Sensation normal.  Cranial nerves II-XII intact.   Skin:   normal overall no scars, lesions, ulcers or rashes. No psoriasis.  Psychiatric: Alert and oriented x 3.  Recent memory intact, remote memory unclear.  Normal mood and affect. Well groomed.  Good eye contact.  Cardiovascular: overall no swelling, no varicosities, no edema bilaterally, normal temperatures of the legs and arms, no clubbing, cyanosis and good capillary refill.  Lymphatic: palpation is normal.  Spine/Pelvis examination:  Inspection:  Overall, sacoiliac joint benign and hips nontender; without crepitus or defects.   Thoracic spine inspection: Alignment normal without kyphosis present   Lumbar spine inspection:  Alignment  without normal lumbar lordosis, without scoliosis apparent.   Thoracic spine palpation:  without tenderness of spinal processes   Lumbar spine palpation: with tenderness of lumbar area; without tightness of lumbar muscles    Range of Motion:   Lumbar flexion, forward flexion is 20  with pain or tenderness    Lumbar extension is 5  with pain or tenderness   Left lateral bend is Abnormal- 5 degrees  with pain or tenderness   Right lateral bend is Abnormal- 5 degrees with pain or tenderness   Straight leg raising is Normal   Strength & tone: Normal   Stability overall normal stability   Examination of left Upper Extremity is  done.  Inspection:   Overall:  Elbow non-tender without crepitus or defects, forearm non-tender without crepitus or defects, wrist non-tender without crepitus or defects, hand non-tender.    Shoulder: with glenohumeral joint tenderness, without effusion.   Upper arm: without swelling and tenderness   Range of motion:   Overall:  Full range of motion of the elbow, full range of motion of wrist and full range of motion in fingers.   Shoulder:  left  140 degrees forward flexion; 100 degrees abduction; 25 degrees internal rotation, 25 degrees external rotation, 15 degrees extension, 40 degrees adduction.   Stability:   Overall:  Shoulder, elbow and wrist stable   Strength and Tone:   Overall full shoulder muscles strength, full upper arm strength and normal upper arm bulk and tone.  PROCEDURE NOTE:  The patient request injection, verbal consent was obtained.  The left shoulder was prepped appropriately after time out was performed.   Sterile technique was observed and injection of 1 cc of Depo-Medrol 40 mg with several cc's of plain xylocaine. Anesthesia was provided by ethyl chloride and a 20-gauge needle was used to inject the shoulder area. A posterior approach was used.  The injection was tolerated well.  A band aid dressing was applied.  The patient was advised to apply ice later today and tomorrow to the injection sight as needed.  Additional services performed: I have talked to her about her weight again.  She needs to reduce her diet intake.  She is smoking and again I have urged her to quit.  She is stubborn and says she will think about it.  The patient has been educated about the nature of the problem(s) and counseled on treatment options.  The patient appeared to understand what I have discussed and is in agreement with it.  Encounter Diagnoses  Name Primary?  . Midline low back pain without sciatica Yes  . Left shoulder pain     PLAN Call if any problems.   Precautions discussed.  Continue current  medications.   Return to clinic 1 month

## 2015-06-23 NOTE — Patient Instructions (Signed)

## 2015-07-05 ENCOUNTER — Telehealth: Payer: Self-pay | Admitting: Orthopaedic Surgery

## 2015-07-05 MED ORDER — OXYCODONE HCL 10 MG PO TABS: 10.0000 mg | ORAL_TABLET | ORAL | Status: DC | PRN

## 2015-07-05 NOTE — Telephone Encounter (Signed)
Patient called and requested Oxycodone [facility]  10 mgs,  Qty 180  This was last written  06-07-15.Sig: Take 1 tablet (10 mg total) by mouth every 4 (four) hours as needed (Must last 30 days.). pain

## 2015-07-05 NOTE — Telephone Encounter (Signed)
Rx done. 

## 2015-07-21 ENCOUNTER — Ambulatory Visit (INDEPENDENT_AMBULATORY_CARE_PROVIDER_SITE_OTHER): Payer: Medicare Other | Admitting: Orthopaedic Surgery

## 2015-07-21 ENCOUNTER — Encounter: Payer: Self-pay | Admitting: Orthopaedic Surgery

## 2015-07-21 VITALS — BP 138/84 | HR 99 | Temp 97.5°F | Ht 61.0 in | Wt 198.0 lb

## 2015-07-21 DIAGNOSIS — M25512 Pain in left shoulder: Secondary | ICD-10-CM | POA: Diagnosis not present

## 2015-07-21 DIAGNOSIS — M545 Low back pain, unspecified: Secondary | ICD-10-CM

## 2015-07-21 DIAGNOSIS — M25511 Pain in right shoulder: Secondary | ICD-10-CM | POA: Diagnosis not present

## 2015-07-21 MED ORDER — OXYCODONE-ACETAMINOPHEN 10-325 MG PO TABS: ORAL_TABLET | ORAL | Status: DC

## 2015-07-21 NOTE — Patient Instructions (Signed)

## 2015-07-21 NOTE — Progress Notes (Signed)
Patient UI:[id number] [name], female DOB:1960-09-28, 55 y.o. [id number]  Chief Complaint  Patient presents with  . Follow-up    Back pain    HPI  [name] is a 55 y.o. female who has chronic lower back and upper back pain as well as bilateral shoulder pain.  Her back is stable.  She has no paresthesias, no spasm no bowel or bladder problems.  She takes her pain medicine.  She uses a cane.    She is emotional today as she had a severe argument with her former brother-in-law a few days ago.   HPI  Body mass index is 37.43 kg/(m^2).  Review of Systems  Constitutional:       Patient does not have Diabetes Mellitus. Patient has hypertension. Patient has COPD or shortness of breath. Patient has BMI > 35. Patient has current smoking history.  HENT: Negative for congestion.   Respiratory: Positive for cough and shortness of breath.        Sleep apnea  Cardiovascular: Negative for chest pain.  Gastrointestinal:       GERD  Endocrine: Positive for cold intolerance.  Musculoskeletal: Positive for back pain and arthralgias.  Allergic/Immunologic: Positive for environmental allergies.  Neurological: Positive for headaches.    Past Medical History  Diagnosis Date  . IBS (irritable bowel syndrome)   . GERD (gastroesophageal reflux disease)   . Migraines   . Sleep apnea   . Back pain   . Hypothyroid   . Osteoarthritis   . Obesity   . Hypertension     diet control    Past Surgical History  Procedure Laterality Date  . Cholecystectomy    . Hernia repair    . Breast biopsy    . Total abdominal hysterectomy      Left oophroectomy for a lare tumor about 21 yrs. RT ovary removed time of hysterectomy  . Esophagogastroduodenoscopy  05/28/2011    Procedure: ESOPHAGOGASTRODUODENOSCOPY (EGD);  Surgeon: Rogene Houston, MD;  Location: [facility];  Service: Endoscopy;  Laterality: N/A;  730  . Amputation  12/13/2011    Procedure: AMPUTATION DIGIT;  Surgeon: Marcheta Grammes, DPM;  Location: [facility];  Service: Orthopedics;  Laterality: Right;  amputation second toe right foot  . Neck surgery      2 yrs ago  . Esophagogastroduodenoscopy N/A 10/09/2013    Procedure: ESOPHAGOGASTRODUODENOSCOPY (EGD);  Surgeon: Rogene Houston, MD;  Location: [facility];  Service: Endoscopy;  Laterality: N/A;  730  . Maloney dilation N/A 10/09/2013    Procedure: Venia Minks DILATION;  Surgeon: Rogene Houston, MD;  Location: [facility];  Service: Endoscopy;  Laterality: N/A;    History reviewed. No pertinent family history.  Social History Social History  Substance Use Topics  . Smoking status: Current Every Day Smoker -- 0.50 packs/day for 35 years  . Smokeless tobacco: None     Comment: 1 pack a day since age18  . Alcohol Use: No    Allergies  Allergen Reactions  . Cephalexin Hives    unknown  . Latex Rash  . Betadine [Povidone Iodine] Rash  . Cleocin [Clindamycin Hcl] Hives  . Sulfa Antibiotics     Vomiting     Current Outpatient Prescriptions  Medication Sig Dispense Refill  . aspirin EC 81 MG tablet Take 81 mg by mouth daily.    . beta carotene w/minerals (OCUVITE) tablet Take 1 tablet by mouth daily.      . diclofenac (VOLTAREN) 25  MG EC tablet Take 25 mg by mouth 2 (two) times daily.    Marland Kitchen esomeprazole (NEXIUM) 40 MG capsule Take 40 mg by mouth daily at 12 noon.    . fentaNYL (DURAGESIC - DOSED MCG/HR) 100 MCG/HR Place 1 patch onto the skin every 3 (three) days.      . furosemide (LASIX) 40 MG tablet Take 40 mg by mouth 3 (three) times daily.      Marland Kitchen levothyroxine (SYNTHROID, LEVOTHROID) 137 MCG tablet Take 137 mcg by mouth daily before breakfast.    . LORazepam (ATIVAN) 0.5 MG tablet Take 0.5 mg by mouth every 8 (eight) hours.    . metoprolol tartrate (LOPRESSOR) 25 MG tablet Take 25 mg by mouth 2 (two) times daily.     . Milnacipran (SAVELLA) 50 MG TABS tablet Take 50 mg by mouth 2 (two) times daily.    . Multiple Vitamins-Iron (MULTIVITAMINS WITH  IRON) TABS Take 1 tablet by mouth daily.    . Oxycodone HCl 10 MG TABS Take 1 tablet (10 mg total) by mouth every 4 (four) hours as needed (Must last 30 days.). pain 180 tablet 0  . oxyCODONE-acetaminophen (PERCOCET) 10-325 MG tablet One every four hours as needed for pain.  Must last 30 days. 180 tablet 0  . polyethylene glycol (MIRALAX / GLYCOLAX) packet Take 17 g by mouth 2 (two) times daily before a meal.      . pregabalin (LYRICA) 100 MG capsule Take 100 mg by mouth 3 (three) times daily.      Marland Kitchen tiZANidine (ZANAFLEX) 4 MG capsule Take 4 mg by mouth every 6 (six) hours.     . topiramate (TOPAMAX) 100 MG tablet Take 100 mg by mouth 2 (two) times daily.      . traMADol (ULTRAM) 50 MG tablet Take 50 mg by mouth at bedtime.     . traZODone (DESYREL) 50 MG tablet Take 50 mg by mouth at bedtime.    Marland Kitchen zolpidem (AMBIEN) 10 MG tablet Take 10 mg by mouth at bedtime.      No current facility-administered medications for this visit.     Physical Exam  Blood pressure 138/84, pulse 99, temperature 97.5 F (36.4 C), height 5\' 1"  (1.549 m), weight 198 lb (89.812 kg).  Constitutional: overall normal hygiene, normal nutrition, well developed, normal grooming, normal body habitus. Assistive device:cane  Musculoskeletal: gait and station Limp right, muscle tone and strength are normal, no tremors or atrophy is present.  .  Neurological: coordination overall normal.  Deep tendon reflex/nerve stretch intact.  Sensation normal.  Cranial nerves II-XII intact.   Skin:   normal overall no scars, lesions, ulcers or rashes. No psoriasis.  Psychiatric: Alert and oriented x 3.  Recent memory intact, remote memory unclear.  Normal mood and affect. Well groomed.  Good eye contact.  Cardiovascular: overall no swelling, no varicosities, no edema bilaterally, normal temperatures of the legs and arms, no clubbing, cyanosis and good capillary refill.  Lymphatic: palpation is normal. Spine/Pelvis  examination:  Inspection:  Overall, sacoiliac joint benign and hips tender; without crepitus or defects.   Thoracic spine inspection: Alignment normal with kyphosis present   Lumbar spine inspection:  Alignment  with normal lumbar lordosis, without scoliosis apparent.   Thoracic spine palpation:  with tenderness of spinal processes   Lumbar spine palpation: with tenderness of lumbar area; with tightness of lumbar muscles    Range of Motion:   Lumbar flexion, forward flexion is 15  with pain or tenderness  Lumbar extension is 5  with pain or tenderness   Left lateral bend is Normal  without pain or tenderness   Right lateral bend is Normal without pain or tenderness   Straight leg raising is Normal   Strength & tone: Normal   Stability overall normal stability   The patient has been educated about the nature of the problem(s) and counseled on treatment options.  The patient appeared to understand what I have discussed and is in agreement with it.  Encounter Diagnoses  Name Primary?  . Midline low back pain without sciatica Yes  . Left shoulder pain   . Right shoulder pain     PLAN Call if any problems.  Precautions discussed.  Continue current medications.   Return to clinic 1 month

## 2015-08-02 NOTE — Telephone Encounter (Signed)
Pt wants to pick up rx on Friday 5/5 since she will run out on sun 5/7.  Please call pt when ready to pick up.

## 2015-08-04 ENCOUNTER — Telehealth: Payer: Self-pay | Admitting: Orthopaedic Surgery

## 2015-08-04 MED ORDER — OXYCODONE HCL 10 MG PO TABS: 10.0000 mg | ORAL_TABLET | ORAL | Status: DC | PRN

## 2015-08-04 NOTE — Telephone Encounter (Signed)
Oxycodone HCI 10mg   180 Tablets

## 2015-08-04 NOTE — Telephone Encounter (Signed)
Rx done. 

## 2015-08-18 ENCOUNTER — Encounter: Payer: Self-pay | Admitting: Orthopaedic Surgery

## 2015-08-18 ENCOUNTER — Ambulatory Visit (INDEPENDENT_AMBULATORY_CARE_PROVIDER_SITE_OTHER): Payer: Medicare Other | Admitting: Orthopaedic Surgery

## 2015-08-18 VITALS — BP 137/75 | HR 61 | Temp 96.8°F | Ht 61.0 in | Wt 198.0 lb

## 2015-08-18 DIAGNOSIS — M545 Low back pain, unspecified: Secondary | ICD-10-CM

## 2015-08-18 DIAGNOSIS — M25512 Pain in left shoulder: Secondary | ICD-10-CM

## 2015-08-18 MED ORDER — OXYCODONE-ACETAMINOPHEN 10-325 MG PO TABS: ORAL_TABLET | ORAL | Status: DC

## 2015-08-18 NOTE — Progress Notes (Signed)
CC:  My left shoulder hurts  She has chronic pain of the left shoulder and decreased motion.  She has no new trauma, no redness.  Shoulder motion is limited secondary to pain.  NV is intact.  Encounter Diagnoses  Name Primary?  . Left shoulder pain Yes  . Midline low back pain without sciatica   . Morbid obesity due to excess calories ([place])     PROCEDURE NOTE:  The patient request injection, verbal consent was obtained.  The left shoulder was prepped appropriately after time out was performed.   Sterile technique was observed and injection of 1 cc of Depo-Medrol 40 mg with several cc's of plain xylocaine. Anesthesia was provided by ethyl chloride and a 20-gauge needle was used to inject the shoulder area. A posterior approach was used.  The injection was tolerated well.  A band aid dressing was applied.  The patient was advised to apply ice later today and tomorrow to the injection sight as needed.  Return in one month.

## 2015-08-31 ENCOUNTER — Telehealth: Payer: Self-pay | Admitting: Orthopaedic Surgery

## 2015-08-31 MED ORDER — OXYCODONE HCL 10 MG PO TABS: 10.0000 mg | ORAL_TABLET | ORAL | Status: DC | PRN

## 2015-08-31 NOTE — Telephone Encounter (Signed)
Rx done. 

## 2015-08-31 NOTE — Telephone Encounter (Signed)
Oxycodone HCI  10 mg  Qty 180 Tablets

## 2015-09-20 ENCOUNTER — Ambulatory Visit (INDEPENDENT_AMBULATORY_CARE_PROVIDER_SITE_OTHER): Payer: Medicare Other | Admitting: Orthopaedic Surgery

## 2015-09-20 ENCOUNTER — Encounter: Payer: Self-pay | Admitting: Orthopaedic Surgery

## 2015-09-20 VITALS — BP 102/71 | HR 69 | Resp 20 | Ht 61.0 in | Wt 198.0 lb

## 2015-09-20 DIAGNOSIS — M545 Low back pain, unspecified: Secondary | ICD-10-CM

## 2015-09-20 DIAGNOSIS — M25511 Pain in right shoulder: Secondary | ICD-10-CM | POA: Diagnosis not present

## 2015-09-20 DIAGNOSIS — M25512 Pain in left shoulder: Secondary | ICD-10-CM | POA: Diagnosis not present

## 2015-09-20 MED ORDER — OXYCODONE-ACETAMINOPHEN 10-325 MG PO TABS: ORAL_TABLET | ORAL | Status: DC

## 2015-09-20 NOTE — Progress Notes (Signed)
Patient UI:[id number] [name], female DOB:01-14-1961, 55 y.o. [id number]  Chief Complaint  Patient presents with  . Follow-up    Back pain and knee pain    HPI  [name] is a 55 y.o. female who has lower back pain and right shoulder pain.  She has continued pain in the right shoulder with decreased motion and pain with overhead use. She has no paresthesias.  She has no redness or trauma.  Her lower and mid back is very tender chronically.  She has no paresthesias, no new trauma.  Her kyphosis is tender.  She has no bowel or bladder problems or weakness.  She needs a new mattress and I have given Rx for a replacement foam mattress [phone] Therapeutic type.  We had a face to face encounter today.  HPI  Body mass index is 37.43 kg/(m^2).  ROS  Review of Systems  Constitutional:       Patient does not have Diabetes Mellitus. Patient has hypertension. Patient has COPD or shortness of breath. Patient has BMI > 35. Patient has current smoking history.  HENT: Negative for congestion.   Respiratory: Positive for cough and shortness of breath.        Sleep apnea  Cardiovascular: Negative for chest pain.  Gastrointestinal:       GERD  Endocrine: Positive for cold intolerance.  Musculoskeletal: Positive for back pain and arthralgias.  Allergic/Immunologic: Positive for environmental allergies.  Neurological: Positive for headaches.    Past Medical History  Diagnosis Date  . IBS (irritable bowel syndrome)   . GERD (gastroesophageal reflux disease)   . Migraines   . Sleep apnea   . Back pain   . Hypothyroid   . Osteoarthritis   . Obesity   . Hypertension     diet control    Past Surgical History  Procedure Laterality Date  . Cholecystectomy    . Hernia repair    . Breast biopsy    . Total abdominal hysterectomy      Left oophroectomy for a lare tumor about 21 yrs. RT ovary removed time of hysterectomy  . Esophagogastroduodenoscopy  05/28/2011    Procedure:  ESOPHAGOGASTRODUODENOSCOPY (EGD);  Surgeon: Rogene Houston, MD;  Location: [facility];  Service: Endoscopy;  Laterality: N/A;  730  . Amputation  12/13/2011    Procedure: AMPUTATION DIGIT;  Surgeon: Marcheta Grammes, DPM;  Location: [facility];  Service: Orthopedics;  Laterality: Right;  amputation second toe right foot  . Neck surgery      2 yrs ago  . Esophagogastroduodenoscopy N/A 10/09/2013    Procedure: ESOPHAGOGASTRODUODENOSCOPY (EGD);  Surgeon: Rogene Houston, MD;  [facility]: [facility];  Service: Endoscopy;  Laterality: N/A;  730  . Maloney dilation N/A 10/09/2013    Procedure: Venia Minks DILATION;  Surgeon: Rogene Houston, MD;  Location: [facility];  Service: Endoscopy;  Laterality: N/A;    History reviewed. No pertinent family history.  Social History Social History  Substance Use Topics  . Smoking status: Current Every Day Smoker -- 0.50 packs/day for 35 years  . Smokeless tobacco: None     Comment: 1 pack a day since age18  . Alcohol Use: No    Allergies  Allergen Reactions  . Cephalexin Hives    unknown  . Latex Rash  . Betadine [Povidone Iodine] Rash  . Cleocin [Clindamycin Hcl] Hives  . Sulfa Antibiotics     Vomiting     Current Outpatient Prescriptions  Medication Sig Dispense  Refill  . aspirin EC 81 MG tablet Take 81 mg by mouth daily.    . beta carotene w/minerals (OCUVITE) tablet Take 1 tablet by mouth daily.      . diclofenac (VOLTAREN) 25 MG EC tablet Take 25 mg by mouth 2 (two) times daily.    Marland Kitchen esomeprazole (NEXIUM) 40 MG capsule Take 40 mg by mouth daily at 12 noon.    . fentaNYL (DURAGESIC - DOSED MCG/HR) 100 MCG/HR Place 1 patch onto the skin every 3 (three) days.      . furosemide (LASIX) 40 MG tablet Take 40 mg by mouth 3 (three) times daily.      Marland Kitchen levothyroxine (SYNTHROID, LEVOTHROID) 137 MCG tablet Take 137 mcg by mouth daily before breakfast.    . LORazepam (ATIVAN) 0.5 MG tablet Take 0.5 mg by mouth every 8 (eight) hours.    .  metoprolol tartrate (LOPRESSOR) 25 MG tablet Take 25 mg by mouth 2 (two) times daily.     . Milnacipran (SAVELLA) 50 MG TABS tablet Take 50 mg by mouth 2 (two) times daily.    . Multiple Vitamins-Iron (MULTIVITAMINS WITH IRON) TABS Take 1 tablet by mouth daily.    . Oxycodone HCl 10 MG TABS Take 1 tablet (10 mg total) by mouth every 4 (four) hours as needed (Must last 30 days.). pain 180 tablet 0  . oxyCODONE-acetaminophen (PERCOCET) 10-325 MG tablet One every four hours as needed for pain.  Must last 30 days. 180 tablet 0  . polyethylene glycol (MIRALAX / GLYCOLAX) packet Take 17 g by mouth 2 (two) times daily before a meal.      . pregabalin (LYRICA) 100 MG capsule Take 100 mg by mouth 3 (three) times daily.      Marland Kitchen tiZANidine (ZANAFLEX) 4 MG capsule Take 4 mg by mouth every 6 (six) hours.     . topiramate (TOPAMAX) 100 MG tablet Take 100 mg by mouth 2 (two) times daily.      . traMADol (ULTRAM) 50 MG tablet Take 50 mg by mouth at bedtime.     . traZODone (DESYREL) 50 MG tablet Take 50 mg by mouth at bedtime.    Marland Kitchen zolpidem (AMBIEN) 10 MG tablet Take 10 mg by mouth at bedtime.      No current facility-administered medications for this visit.     Physical Exam  Blood pressure 102/71, pulse 69, resp. rate 20, height 5\' 1"  (1.549 m), weight 198 lb (89.812 kg).  Constitutional: overall normal hygiene, normal nutrition, well developed, normal grooming, normal body habitus. Assistive device:cane  Musculoskeletal: gait and station Limp left, muscle tone and strength are normal, no tremors or atrophy is present.  .  Neurological: coordination overall normal.  Deep tendon reflex/nerve stretch intact.  Sensation normal.  Cranial nerves II-XII intact.   Skin:   normal overall no scars, lesions, ulcers or rashes. No psoriasis.  Psychiatric: Alert and oriented x 3.  Recent memory intact, remote memory unclear.  Normal mood and affect. Well groomed.  Good eye contact.  Cardiovascular: overall no  swelling, no varicosities, no edema bilaterally, normal temperatures of the legs and arms, no clubbing, cyanosis and good capillary refill.  Lymphatic: palpation is normal.  Spine/Pelvis examination:  Inspection:  Overall, sacoiliac joint benign and hips nontender; without crepitus or defects.   Thoracic spine inspection: Alignment normal with kyphosis present   Lumbar spine inspection:  Alignment  with normal lumbar lordosis, without scoliosis apparent.   Thoracic spine palpation:  with tenderness  of spinal processes   Lumbar spine palpation: with tenderness of lumbar area; with tightness of lumbar muscles    Range of Motion:   Lumbar flexion, forward flexion is 20  with pain or tenderness    Lumbar extension is 5  with pain or tenderness   Left lateral bend is Normal  without pain or tenderness   Right lateral bend is Normal without pain or tenderness   Straight leg raising is Normal   Strength & tone: Normal   Stability overall normal stability   Examination of right Upper Extremity is done.  Inspection:   Overall:  Elbow non-tender without crepitus or defects, forearm non-tender without crepitus or defects, wrist non-tender without crepitus or defects, hand non-tender.    Shoulder: with glenohumeral joint tenderness, without effusion.   Upper arm: without swelling and tenderness   Range of motion:   Overall:  Full range of motion of the elbow, full range of motion of wrist and full range of motion in fingers.   Shoulder:  right  160 degrees forward flexion; 145 degrees abduction; 30 degrees internal rotation, 30 degrees external rotation, 15 degrees extension, 40 degrees adduction.   Stability:   Overall:  Shoulder, elbow and wrist stable   Strength and Tone:   Overall full shoulder muscles strength, full upper arm strength and normal upper arm bulk and tone.   PROCEDURE NOTE:  The patient request injection, verbal consent was obtained.  The right shoulder was  prepped appropriately after time out was performed.   Sterile technique was observed and injection of 1 cc of Depo-Medrol 40 mg with several cc's of plain xylocaine. Anesthesia was provided by ethyl chloride and a 20-gauge needle was used to inject the shoulder area. A posterior approach was used.  The injection was tolerated well.  A band aid dressing was applied.  The patient was advised to apply ice later today and tomorrow to the injection sight as needed. The patient has been educated about the nature of the problem(s) and counseled on treatment options.  The patient appeared to understand what I have discussed and is in agreement with it.  Encounter Diagnoses  Name Primary?  . Right shoulder pain   . Midline low back pain without sciatica   . Left shoulder pain Yes  . Morbid obesity due to excess calories [facility])     PLAN Call if any problems.  Precautions discussed.  Continue current medications.   Return to clinic 1 month   Electronically Signed Sanjuana Kava, MD 6/20/201711:28 AM

## 2015-09-22 ENCOUNTER — Ambulatory Visit: Payer: Medicare Other | Admitting: Orthopaedic Surgery

## 2015-10-05 ENCOUNTER — Telehealth: Payer: Self-pay | Admitting: Orthopaedic Surgery

## 2015-10-05 MED ORDER — OXYCODONE HCL 10 MG PO TABS: 10.0000 mg | ORAL_TABLET | ORAL | Status: DC | PRN

## 2015-10-05 NOTE — Telephone Encounter (Signed)
Patient called and requested a refill on Oxycodone HCI Qty  180  Sig: Take 1 tablet (10 mg total) by mouth every 4 (four) hours as needed (Must last 30 days.). pain

## 2015-10-05 NOTE — Telephone Encounter (Signed)
Rx done. 

## 2015-10-19 ENCOUNTER — Ambulatory Visit (INDEPENDENT_AMBULATORY_CARE_PROVIDER_SITE_OTHER): Payer: Medicare Other | Admitting: Orthopaedic Surgery

## 2015-10-19 ENCOUNTER — Encounter: Payer: Self-pay | Admitting: Orthopaedic Surgery

## 2015-10-19 VITALS — BP 122/73 | HR 73 | Temp 97.3°F | Ht 60.0 in | Wt 189.0 lb

## 2015-10-19 DIAGNOSIS — M25512 Pain in left shoulder: Secondary | ICD-10-CM

## 2015-10-19 DIAGNOSIS — M545 Low back pain, unspecified: Secondary | ICD-10-CM

## 2015-10-19 DIAGNOSIS — M25511 Pain in right shoulder: Secondary | ICD-10-CM

## 2015-10-19 MED ORDER — OXYCODONE-ACETAMINOPHEN 10-325 MG PO TABS: ORAL_TABLET | ORAL | Status: DC

## 2015-10-19 NOTE — Progress Notes (Signed)
CC:  My left shoulder is hurting more  She has chronic pain of the left shoulder with no trauma.  She has no paresthesias.  She has pain with overhead use.  Left shoulder has near full motion but is painful in all ranges.  NV intact.  Encounter Diagnoses  Name Primary?  . Left shoulder pain   . Right shoulder pain Yes  . Midline low back pain without sciatica   . Morbid obesity due to excess calories ([place])     PROCEDURE NOTE:  The patient request injection, verbal consent was obtained.  The left shoulder was prepped appropriately after time out was performed.   Sterile technique was observed and injection of 1 cc of Depo-Medrol 40 mg with several cc's of plain xylocaine. Anesthesia was provided by ethyl chloride and a 20-gauge needle was used to inject the shoulder area. A posterior approach was used.  The injection was tolerated well.  A band aid dressing was applied.  The patient was advised to apply ice later today and tomorrow to the injection sight as needed.  Return in one month  Call if any problem  Precautions discussed.  Electronically Signed Sanjuana Kava, MD 7/19/201711:08 AM

## 2015-10-20 ENCOUNTER — Ambulatory Visit: Payer: Medicare Other | Admitting: Orthopaedic Surgery

## 2015-11-01 ENCOUNTER — Telehealth: Payer: Self-pay | Admitting: Orthopaedic Surgery

## 2015-11-01 MED ORDER — OXYCODONE HCL 10 MG PO TABS: 10.0000 mg | ORAL_TABLET | ORAL | 0 refills | Status: DC | PRN

## 2015-11-01 NOTE — Telephone Encounter (Signed)
Oxycodone HCI 10mg   Qty 180 Tablets

## 2015-11-15 ENCOUNTER — Encounter (INDEPENDENT_AMBULATORY_CARE_PROVIDER_SITE_OTHER): Payer: Self-pay | Admitting: Internal Medicine

## 2015-11-16 ENCOUNTER — Ambulatory Visit (INDEPENDENT_AMBULATORY_CARE_PROVIDER_SITE_OTHER): Payer: Medicare Other | Admitting: Orthopaedic Surgery

## 2015-11-16 ENCOUNTER — Encounter: Payer: Self-pay | Admitting: Orthopaedic Surgery

## 2015-11-16 VITALS — BP 125/70 | HR 68 | Temp 97.5°F | Ht 61.5 in | Wt 182.0 lb

## 2015-11-16 DIAGNOSIS — M25512 Pain in left shoulder: Secondary | ICD-10-CM

## 2015-11-16 DIAGNOSIS — M545 Low back pain, unspecified: Secondary | ICD-10-CM

## 2015-11-16 DIAGNOSIS — M25511 Pain in right shoulder: Secondary | ICD-10-CM | POA: Diagnosis not present

## 2015-11-16 DIAGNOSIS — Z720 Tobacco use: Secondary | ICD-10-CM

## 2015-11-16 DIAGNOSIS — F17200 Nicotine dependence, unspecified, uncomplicated: Secondary | ICD-10-CM

## 2015-11-16 MED ORDER — OXYCODONE-ACETAMINOPHEN 10-325 MG PO TABS: ORAL_TABLET | ORAL | 0 refills | Status: DC

## 2015-11-16 NOTE — Progress Notes (Addendum)
Patient [id number] [name], female DOB:[birth year], 55 y.o. [id number]  Chief Complaint  Patient presents with  . Follow-up    shoulder and back pain    HPI  [name] is a 55 y.o. female who has chronic shoulder pain more on the left than the right.  She says the injections help only a short while.  She has no new trauma.  I have asked about her getting a MRI but she declines.  She has pain with motion overhead. She has no redness or paresthesias.  Her back pain is stable. She has no new episodes.  She has increasing kyphosis of the back.  She has had problems with rectal bleeding and is scheduled to see Dr. Laural Golden for that. HPI  Body mass index is 33.83 kg/m.  ROS  Review of Systems  Constitutional:       Patient does not have Diabetes Mellitus. Patient has hypertension. Patient has COPD or shortness of breath. Patient has BMI > 35. Patient has current smoking history.  HENT: Negative for congestion.   Respiratory: Positive for cough and shortness of breath.        Sleep apnea  Cardiovascular: Negative for chest pain.  Gastrointestinal:       GERD  Endocrine: Positive for cold intolerance.  Musculoskeletal: Positive for arthralgias and back pain.  Allergic/Immunologic: Positive for environmental allergies.  Neurological: Positive for headaches.    Past Medical History:  Diagnosis Date  . Back pain   . GERD (gastroesophageal reflux disease)   . Hypertension    diet control  . Hypothyroid   . IBS (irritable bowel syndrome)   . Migraines   . Obesity   . Osteoarthritis   . Sleep apnea     Past Surgical History:  Procedure Laterality Date  . AMPUTATION  12/13/2011   Procedure: AMPUTATION DIGIT;  Surgeon: Marcheta Grammes, DPM;  Location: [facility];  Service: Orthopedics;  Laterality: Right;  amputation second toe right foot  . BREAST BIOPSY    . CHOLECYSTECTOMY    . ESOPHAGOGASTRODUODENOSCOPY  05/28/2011   Procedure: ESOPHAGOGASTRODUODENOSCOPY (EGD);   Surgeon: Rogene Houston, MD;  Location: [facility];  Service: Endoscopy;  Laterality: N/A;  730  . ESOPHAGOGASTRODUODENOSCOPY N/A 10/09/2013   Procedure: ESOPHAGOGASTRODUODENOSCOPY (EGD);  Surgeon: Rogene Houston, MD;  Location: [facility];  Service: Endoscopy;  Laterality: N/A;  730  . [place] N/A 10/09/2013   Procedure: Venia Minks DILATION;  Surgeon: Rogene Houston, MD;  Location: [facility];  Service: Endoscopy;  Laterality: N/A;  . NECK SURGERY     2 yrs ago  . TOTAL ABDOMINAL HYSTERECTOMY     Left oophroectomy for a lare tumor about 21 yrs. RT ovary removed time of hysterectomy    No family history on file.  Social History Social History  Substance Use Topics  . Smoking status: Current Every Day Smoker    Packs/day: 0.50    Years: 35.00  . Smokeless tobacco: Not on file     Comment: 1 pack a day since age18  . Alcohol use No    Allergies  Allergen Reactions  . Cephalexin Hives    unknown  . Latex Rash  . Betadine [Povidone Iodine] Rash  . Cleocin [Clindamycin Hcl] Hives  . Sulfa Antibiotics     Vomiting     Current Outpatient Prescriptions  Medication Sig Dispense Refill  . aspirin EC 81 MG tablet Take 81 mg by mouth  daily.    . beta carotene w/minerals (OCUVITE) tablet Take 1 tablet by mouth daily.      . diclofenac (VOLTAREN) 25 MG EC tablet Take 25 mg by mouth 2 (two) times daily.    Marland Kitchen esomeprazole (NEXIUM) 40 MG capsule Take 40 mg by mouth daily at 12 noon.    . fentaNYL (DURAGESIC - DOSED MCG/HR) 100 MCG/HR Place 1 patch onto the skin every 3 (three) days.      . furosemide (LASIX) 40 MG tablet Take 40 mg by mouth 3 (three) times daily.      Marland Kitchen levothyroxine (SYNTHROID, LEVOTHROID) 137 MCG tablet Take 137 mcg by mouth daily before breakfast.    . LORazepam (ATIVAN) 0.5 MG tablet Take 0.5 mg by mouth every 8 (eight) hours.    . metoprolol tartrate (LOPRESSOR) 25 MG tablet Take 25 mg by mouth 2 (two) times daily.     .  Milnacipran (SAVELLA) 50 MG TABS tablet Take 50 mg by mouth 2 (two) times daily.    . Multiple Vitamins-Iron (MULTIVITAMINS WITH IRON) TABS Take 1 tablet by mouth daily.    . Oxycodone HCl 10 MG TABS Take 1 tablet (10 mg total) by mouth every 4 (four) hours as needed (Must last 30 days.). pain 180 tablet 0  . oxyCODONE-acetaminophen (PERCOCET) 10-325 MG tablet One every four hours as needed for pain.  Must last 30 days. 180 tablet 0  . polyethylene glycol (MIRALAX / GLYCOLAX) packet Take 17 g by mouth 2 (two) times daily before a meal.      . pregabalin (LYRICA) 100 MG capsule Take 100 mg by mouth 3 (three) times daily.      Marland Kitchen tiZANidine (ZANAFLEX) 4 MG capsule Take 4 mg by mouth every 6 (six) hours.     . topiramate (TOPAMAX) 100 MG tablet Take 100 mg by mouth 2 (two) times daily.      . traMADol (ULTRAM) 50 MG tablet Take 50 mg by mouth at bedtime.     . traZODone (DESYREL) 50 MG tablet Take 50 mg by mouth at bedtime.    Marland Kitchen zolpidem (AMBIEN) 10 MG tablet Take 10 mg by mouth at bedtime.      No current facility-administered medications for this visit.      Physical Exam  Blood pressure 125/70, pulse 68, temperature 97.5 F (36.4 C), height 5' 1.5" (1.562 m), weight 182 lb (82.6 kg).  Constitutional: overall normal hygiene, normal nutrition, well developed, normal grooming, normal body habitus. Assistive device:cane  Musculoskeletal: gait and station Limp left, muscle tone and strength are normal, no tremors or atrophy is present.  .  Neurological: coordination overall normal.  Deep tendon reflex/nerve stretch intact.  Sensation normal.  Cranial nerves II-XII intact.   Skin:   normal overall no scars, lesions, ulcers or rashes. No psoriasis.  Psychiatric: Alert and oriented x 3.  Recent memory intact, remote memory unclear.  Normal mood and affect. Well groomed.  Good eye contact.  Cardiovascular: overall no swelling, no varicosities, no edema bilaterally, normal temperatures of the  legs and arms, no clubbing, cyanosis and good capillary refill.  Lymphatic: palpation is normal.  Spine/Pelvis examination:  Inspection:  Overall, sacoiliac joint benign and hips nontender; without crepitus or defects.   Thoracic spine inspection: Alignment normal with kyphosis present   Lumbar spine inspection:  Alignment  with normal lumbar lordosis, without scoliosis apparent.   Thoracic spine palpation:  with tenderness of spinal processes   Lumbar spine palpation: with tenderness  of lumbar area; with tightness of lumbar muscles    Range of Motion:   Lumbar flexion, forward flexion is 20 with pain or tenderness    Lumbar extension is 5 with pain or tenderness   Left lateral bend is Normal  without pain or tenderness   Right lateral bend is Normal without pain or tenderness   Straight leg raising is Normal   Strength & tone: Normal   Stability overall normal stability   Examination of left Upper Extremity is done.  Inspection:   Overall:  Elbow non-tender without crepitus or defects, forearm non-tender without crepitus or defects, wrist non-tender without crepitus or defects, hand non-tender.    Shoulder: with glenohumeral joint tenderness, without effusion.   Upper arm: without swelling and tenderness   Range of motion:   Overall:  Full range of motion of the elbow, full range of motion of wrist and full range of motion in fingers.   Shoulder:  left  120 degrees forward flexion; 100 degrees abduction; 30 degrees internal rotation, 30 degrees external rotation, 10 degrees extension, 30 degrees adduction.   Stability:   Overall:  Shoulder, elbow and wrist stable   Strength and Tone:   Overall full shoulder muscles strength, full upper arm strength and normal upper arm bulk and tone.   The patient has been educated about the nature of the problem(s) and counseled on treatment options.  The patient appeared to understand what I have discussed and is in agreement with  it.  Encounter Diagnoses  Name Primary?  . Left shoulder pain Yes  . Right shoulder pain   . Morbid obesity due to excess calories ([place])   . Midline low back pain without sciatica     PLAN Call if any problems.  Precautions discussed.  Continue current medications.   Return to clinic 1 month   Electronically Signed Sanjuana Kava, MD 8/16/201710:25 AM     She is also a tobacco smoker and I have talked to her about cutting back or quitting.  She will think about it.

## 2015-11-17 ENCOUNTER — Encounter (INDEPENDENT_AMBULATORY_CARE_PROVIDER_SITE_OTHER): Payer: Self-pay | Admitting: Internal Medicine

## 2015-11-17 ENCOUNTER — Ambulatory Visit (INDEPENDENT_AMBULATORY_CARE_PROVIDER_SITE_OTHER): Payer: Medicare Other | Admitting: Internal Medicine

## 2015-11-17 ENCOUNTER — Ambulatory Visit: Payer: Medicare Other | Admitting: Orthopaedic Surgery

## 2015-11-17 ENCOUNTER — Encounter (INDEPENDENT_AMBULATORY_CARE_PROVIDER_SITE_OTHER): Payer: Self-pay

## 2015-11-17 VITALS — BP 156/100 | HR 80 | Temp 97.6°F | Ht 61.0 in | Wt 183.7 lb

## 2015-11-17 DIAGNOSIS — R1013 Epigastric pain: Secondary | ICD-10-CM

## 2015-11-17 DIAGNOSIS — G8929 Other chronic pain: Secondary | ICD-10-CM | POA: Diagnosis not present

## 2015-11-17 DIAGNOSIS — R1032 Left lower quadrant pain: Secondary | ICD-10-CM | POA: Diagnosis not present

## 2015-11-17 DIAGNOSIS — N200 Calculus of kidney: Secondary | ICD-10-CM | POA: Diagnosis not present

## 2015-11-17 NOTE — Patient Instructions (Signed)
I advised patient to go to the ED. Rates pain 12/10.

## 2015-11-17 NOTE — Progress Notes (Signed)
Subjective:    Patient ID: [name], female    DOB: [birth year], 55 y.o.   MRN: [id number]  HPI Referred by Dr Scotty Court for abdominal pain.  She says she is having lower back pain radiating into her Left lower abdomen. She says she is passing kidney stones for the past month are so.  She says she is having a hard time eating. She says after eating she has swelling in her rt upper abdomen. She feels like she  is going to explode. Her acid reflux is controlled for the most part with Nexium.  She has a BM every day. No melena or BRRB. She is rarely constipated.  Appetite is not good.  In 2014 at [place] her weight was 211. Today her weight is 183.7.  There has been no fever.  She says she has had the pain for about 8 months.  She underwent an EGD in 2015 for dysphagia Impression: Coarse appearance esophageal mucosa but no evidence of ring or stricture formation. No evidence of peptic ulcer disease or gastritis. Esophagus dilated by passing 104 [place] dilator. Biopsy taken from the sufficient mucosa to rule out eosinophilic esophagitis.    09/08/2015 acute abdomen: Stable focal ill-defined opacity at the rt lung base, for which the patient is due for a 3 month follow up chest CT/.. Otherwise no active disease in the chest.  Mild diffuse gaseous distention of the large bowel. Scattered air fluid levels in the small bowel with no disproportionately dilated small bowel loops.  Most suggestive of a mild adynamic ileus.    Review of Systems Past Medical History:  Diagnosis Date  . Back pain   . GERD (gastroesophageal reflux disease)   . Hypertension    diet control  . Hypothyroid   . IBS (irritable bowel syndrome)   . Migraines   . Obesity   . Osteoarthritis   . Sleep apnea     Past Surgical History:  Procedure Laterality Date  . AMPUTATION  12/13/2011   Procedure: AMPUTATION DIGIT;  Surgeon: Marcheta Grammes, DPM;  Location: [facility];  Service: Orthopedics;  Laterality: Right;   amputation second toe right foot  . BREAST BIOPSY    . CHOLECYSTECTOMY    . ESOPHAGOGASTRODUODENOSCOPY  05/28/2011   Procedure: ESOPHAGOGASTRODUODENOSCOPY (EGD);  Surgeon: Rogene Houston, MD;  [facility]: [facility];  Service: Endoscopy;  Laterality: N/A;  730  . ESOPHAGOGASTRODUODENOSCOPY N/A 10/09/2013   Procedure: ESOPHAGOGASTRODUODENOSCOPY (EGD);  Surgeon: Rogene Houston, MD;  [facility]: [facility];  Service: Endoscopy;  Laterality: N/A;  730  . [place] N/A 10/09/2013   Procedure: Venia Minks DILATION;  Surgeon: Rogene Houston, MD;  Location: [facility];  Service: Endoscopy;  Laterality: N/A;  . NECK SURGERY     2 yrs ago  . TOTAL ABDOMINAL HYSTERECTOMY     Left oophroectomy for a lare tumor about 21 yrs. RT ovary removed time of hysterectomy    Allergies  Allergen Reactions  . Cephalexin Hives    unknown  . Latex Rash  . Betadine [Povidone Iodine] Rash  . Cleocin [Clindamycin Hcl] Hives  . Sulfa Antibiotics     Vomiting     Current Outpatient Prescriptions on File Prior to Visit  Medication Sig Dispense Refill  . aspirin EC 81 MG tablet Take 81 mg by mouth daily.    . beta carotene w/minerals (OCUVITE) tablet Take 1 tablet by mouth daily.      Marland Kitchen  diclofenac (VOLTAREN) 25 MG EC tablet Take 25 mg by mouth 2 (two) times daily.    Marland Kitchen esomeprazole (NEXIUM) 40 MG capsule Take 40 mg by mouth daily at 12 noon.    . fentaNYL (DURAGESIC - DOSED MCG/HR) 100 MCG/HR Place 1 patch onto the skin every 3 (three) days.      . furosemide (LASIX) 40 MG tablet Take 40 mg by mouth 3 (three) times daily.      Marland Kitchen levothyroxine (SYNTHROID, LEVOTHROID) 137 MCG tablet Take 137 mcg by mouth daily before breakfast.    . LORazepam (ATIVAN) 0.5 MG tablet Take 0.5 mg by mouth every 8 (eight) hours.    . metoprolol tartrate (LOPRESSOR) 25 MG tablet Take 25 mg by mouth 2 (two) times daily.     . Milnacipran (SAVELLA) 50 MG TABS tablet Take 50 mg by mouth 2 (two) times daily.      . Multiple Vitamins-Iron (MULTIVITAMINS WITH IRON) TABS Take 1 tablet by mouth daily.    . Oxycodone HCl 10 MG TABS Take 1 tablet (10 mg total) by mouth every 4 (four) hours as needed (Must last 30 days.). pain 180 tablet 0  . oxyCODONE-acetaminophen (PERCOCET) 10-325 MG tablet One every four hours as needed for pain.  Must last 30 days. 180 tablet 0  . polyethylene glycol (MIRALAX / GLYCOLAX) packet Take 17 g by mouth 2 (two) times daily before a meal.      . pregabalin (LYRICA) 100 MG capsule Take 100 mg by mouth 3 (three) times daily.      Marland Kitchen tiZANidine (ZANAFLEX) 4 MG capsule Take 4 mg by mouth every 6 (six) hours.     . topiramate (TOPAMAX) 100 MG tablet Take 100 mg by mouth 2 (two) times daily.      . traMADol (ULTRAM) 50 MG tablet Take 50 mg by mouth at bedtime.     . traZODone (DESYREL) 50 MG tablet Take 50 mg by mouth at bedtime.    Marland Kitchen zolpidem (AMBIEN) 10 MG tablet Take 10 mg by mouth at bedtime.      No current facility-administered medications on file prior to visit.        Objective:   Physical Exam Blood pressure (!) 156/100, pulse 80, temperature 97.6 F (36.4 C), height 5\' 1"  (1.549 m), weight 183 lb 11.2 oz (83.3 kg).   Alert and oriented. Skin warm and dry. Oral mucosa is moist.   . Sclera anicteric, conjunctivae is pink. Thyroid not enlarged. No cervical lymphadenopathy. Lungs clear. Heart regular rate and rhythm.  Abdomen is soft. Bowel sounds are positive. No hepatomegaly. No abdominal masses felt. Tenderness LLQ  .  Tenderness left flank.  She is teary in the office.       Assessment & Plan:   LLQ. Rates the pain at a 12/10. I advised her to go to the ED. She also c/o back pain.  She has 2 problems LLQ pain and she says she is having a kidney stone.  Am going to refer her to the ED for further evaluation .

## 2015-11-30 ENCOUNTER — Telehealth: Payer: Self-pay | Admitting: Orthopaedic Surgery

## 2015-11-30 MED ORDER — OXYCODONE HCL 10 MG PO TABS: 10.0000 mg | ORAL_TABLET | ORAL | 0 refills | Status: DC | PRN

## 2015-11-30 NOTE — Telephone Encounter (Signed)
Oxycodone HCI 10 MG  Qty 180 Tablets

## 2015-12-13 ENCOUNTER — Encounter (INDEPENDENT_AMBULATORY_CARE_PROVIDER_SITE_OTHER): Payer: Self-pay | Admitting: Internal Medicine

## 2015-12-13 ENCOUNTER — Ambulatory Visit (INDEPENDENT_AMBULATORY_CARE_PROVIDER_SITE_OTHER): Payer: Medicare Other | Admitting: Internal Medicine

## 2015-12-13 ENCOUNTER — Encounter (INDEPENDENT_AMBULATORY_CARE_PROVIDER_SITE_OTHER): Payer: Self-pay | Admitting: *Deleted

## 2015-12-13 ENCOUNTER — Other Ambulatory Visit (INDEPENDENT_AMBULATORY_CARE_PROVIDER_SITE_OTHER): Payer: Self-pay | Admitting: Internal Medicine

## 2015-12-13 VITALS — BP 130/82 | HR 78 | Temp 97.5°F | Resp 18 | Ht 61.0 in | Wt 179.7 lb

## 2015-12-13 DIAGNOSIS — R1011 Right upper quadrant pain: Secondary | ICD-10-CM | POA: Insufficient documentation

## 2015-12-13 DIAGNOSIS — R112 Nausea with vomiting, unspecified: Secondary | ICD-10-CM | POA: Insufficient documentation

## 2015-12-13 DIAGNOSIS — R110 Nausea: Secondary | ICD-10-CM | POA: Diagnosis not present

## 2015-12-13 DIAGNOSIS — K920 Hematemesis: Secondary | ICD-10-CM | POA: Diagnosis not present

## 2015-12-13 LAB — HEPATIC FUNCTION PANEL
ALK PHOS: 56 U/L (ref 33–130)
ALT: 15 U/L (ref 6–29)
AST: 21 U/L (ref 10–35)
Albumin: 3.8 g/dL (ref 3.6–5.1)
BILIRUBIN DIRECT: 0.1 mg/dL (ref <=0.2)
BILIRUBIN INDIRECT: 0.3 mg/dL (ref 0.2–1.2)
BILIRUBIN TOTAL: 0.4 mg/dL (ref 0.2–1.2)
Total Protein: 7.9 g/dL (ref 6.1–8.1)

## 2015-12-13 LAB — CBC
HEMATOCRIT: 43.5 % (ref 35.0–45.0)
HEMOGLOBIN: 14.3 g/dL (ref 11.7–15.5)
MCH: 29.8 pg (ref 27.0–33.0)
MCHC: 32.9 g/dL (ref 32.0–36.0)
MCV: 90.6 fL (ref 80.0–100.0)
MPV: 12.6 fL — AB (ref 7.5–12.5)
Platelets: 197 10*3/uL (ref 140–400)
RBC: 4.8 MIL/uL (ref 3.80–5.10)
RDW: 14.6 % (ref 11.0–15.0)
WBC: 5.5 10*3/uL (ref 3.8–10.8)

## 2015-12-13 NOTE — Progress Notes (Signed)
Presenting complaint;  Nausea vomiting right upper quadrant abdominal pain and hematemesis.  History of present illness.:  Patient is 55 year old Caucasian female with multiple medical problems who is here for scheduled visit. She is well known to me from previous evaluation. She was seen in her office on 11/17/2015 for left low quadrant abdominal pain and was referred to emergency room. She was found to have urolithiasis with left ureteral stone and hydronephrosis and has been evaluated by Dr.Bauer with that intervention earlier this month. She presents with at least 6 month history of nausea and vomiting. Lately her nausea and vomiting has been occurring at least 4 times a week and usually after evening meal. She vomits within 30-60 minutes and occasionally may vomit food that she had eaten several hours earlier. She also has noted a few episodes of hematemesis when she notices burgundy upright: Blood. She reports intermittent melena. Her stool was noted to be heme positive. She also complains of pain in epigastric and right upper quadrant. She states pain is so bad after eating and she feels as if her stomach is going to explode. She has this pain at least 3-4 times a week if not daily. She states she does not have heartburn as long as she takes her Nexium. She does not have a good appetite. She continues to gradually lose weight. She has lost 4 pounds in the last 1 month. She has lost 32 pounds in the last 2 years. She also complains of chronic neck and lower back pain. She also has pain in her lower extremities. She is alternating oxycodone with Percocet. She also takes fentanyl patch every 3 days. Bowels move daily as long as she takes polyethylene glycol. Review of the systems is negative for fever chills or night sweats. She also denies rectal bleeding. Patient was seen by Dr. Britta Mccreedy on 11/03/2015. He recommended that patient be followed in her office because she had been seen  previously.   Current Medications: Outpatient Encounter Prescriptions as of 12/13/2015  Medication Sig  . beta carotene w/minerals (OCUVITE) tablet Take 1 tablet by mouth daily.    Marland Kitchen esomeprazole (NEXIUM) 40 MG capsule Take 1 capsule (40 mg total) by mouth daily before breakfast.  . fentaNYL (DURAGESIC - DOSED MCG/HR) 100 MCG/HR Place 1 patch onto the skin every 3 (three) days.    . furosemide (LASIX) 40 MG tablet Take 40 mg by mouth 3 (three) times daily.    Marland Kitchen levothyroxine (SYNTHROID, LEVOTHROID) 137 MCG tablet Take 137 mcg by mouth daily before breakfast.  . LORazepam (ATIVAN) 0.5 MG tablet Take 0.5 mg by mouth every 8 (eight) hours.  . metoprolol tartrate (LOPRESSOR) 25 MG tablet Take 25 mg by mouth 2 (two) times daily.   . Milnacipran (SAVELLA) 50 MG TABS tablet Take 50 mg by mouth 2 (two) times daily.  . Multiple Vitamins-Iron (MULTIVITAMINS WITH IRON) TABS Take 1 tablet by mouth daily.  . Oxycodone HCl 10 MG TABS Take 1 tablet (10 mg total) by mouth every 4 (four) hours as needed (Must last 30 days.). pain  . oxyCODONE-acetaminophen (PERCOCET) 10-325 MG tablet One every four hours as needed for pain.  Must last 30 days.  . polyethylene glycol (MIRALAX / GLYCOLAX) packet Take 17 g by mouth 2 (two) times daily before a meal.    . pregabalin (LYRICA) 100 MG capsule Take 100 mg by mouth 3 (three) times daily.    Marland Kitchen tiZANidine (ZANAFLEX) 4 MG capsule Take 4 mg by mouth every 6 (  six) hours.   . topiramate (TOPAMAX) 100 MG tablet Take 100 mg by mouth 2 (two) times daily.    . traMADol (ULTRAM) 50 MG tablet Take 50 mg by mouth at bedtime.   . traZODone (DESYREL) 50 MG tablet Take 50 mg by mouth at bedtime.  Marland Kitchen zolpidem (AMBIEN) 10 MG tablet Take 10 mg by mouth at bedtime.   . [DISCONTINUED] diclofenac (VOLTAREN) 25 MG EC tablet Take 25 mg by mouth 2 (two) times daily.  . [DISCONTINUED] esomeprazole (NEXIUM) 40 MG capsule Take 40 mg by mouth daily at 12 noon.  . [DISCONTINUED] aspirin EC 81 MG  tablet Take 81 mg by mouth daily.   No facility-administered encounter medications on file as of 12/13/2015.    Past Medical History:  Diagnosis Date  . 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 GERD (gastroesophageal reflux disease)   . Hypertension    Diabetes mellitus; diet control  . Hypothyroid   . IBS (irritable bowel syndrome)   . Migraines   . Obesity   . Osteoarthritis   . Sleep apnea        Chronic constipation.      History of nephrolithiasis with recent intervention by Dr.Bauer.  Past Surgical History:  Procedure Laterality Date  . AMPUTATION  12/13/2011   Procedure: AMPUTATION DIGIT;  Surgeon: Marcheta Grammes, DPM;  Location: [facility];  Service: Orthopedics;  Laterality: Right;  amputation second toe right foot  . BREAST BIOPSY    . CHOLECYSTECTOMY    . ESOPHAGOGASTRODUODENOSCOPY  05/28/2011   Procedure: ESOPHAGOGASTRODUODENOSCOPY (EGD);  Surgeon: Rogene Houston, MD;  [facility];  Service: Endoscopy;  Laterality: N/A;  730  . ESOPHAGOGASTRODUODENOSCOPY N/A 10/09/2013   Procedure: ESOPHAGOGASTRODUODENOSCOPY (EGD);  Surgeon: Rogene Houston, MD;  [facility]: [facility];  Service: Endoscopy;  Laterality: N/A;  730  . [place] N/A 10/09/2013   Procedure: Venia Minks DILATION;  Surgeon: Rogene Houston, MD;  [facility]: [facility];  Service: Endoscopy;  Laterality: N/A;  . NECK SURGERY     2 yrs ago  . TOTAL ABDOMINAL HYSTERECTOMY     Left oophroectomy for a lare tumor about 21 yrs. RT ovary removed time of hysterectomy    Objective: Blood pressure 130/82, pulse 78, temperature 97.5 F (36.4 C), temperature source Oral, resp. rate 18, height 5\' 1"  (1.549 m), weight 179 lb 11.2 oz (81.5 kg). Patient is alert and in no acute distress. Her posture is abnormal. She stoops forward. Conjunctiva is pink. Sclera is nonicteric Oropharyngeal mucosa is normal. She has upper and lower dentures. No neck  masses or thyromegaly noted. Cardiac exam with regular rhythm normal S1 and S2. No murmur or gallop noted. Lungs are clear to auscultation. Abdomen is full in upper half. Bowel sounds are normal. On palpation abdomen is soft with mild tenderness at LLQ and epigastric region with mild to moderate tenderness below the right costal margin. No organomegaly or masses.  Trace edema noted to both ankles slightly more on the right side.  Labs/studies Results: Abdominopelvic CT without contrast on 12/06/2015 revealed bilateral nephrolithiasis with stone in left ureter and mild hydronephrosis. No evidence of dilated small or large bowel.   Assessment:  #1. Recurrent nausea and vomiting. Patient does not appear to be acutely oral or dehydrated. Patient is on NSAID and therefore could have peptic ulcer disease. EGD in February 2013 and July 2015 was negative  for peptic ulcer disease. She could also have gastroparesis at least given that she is on narcotics and that she is diabetic. Doubt that process is due to small bowel stricture/adhesions. Also need to rule out biliary tract disease. #2. Hematemesis. Differential diagnosis includes Mallory-Weiss tear secondary to recurrent vomiting reflux esophagitis or peptic ulcer disease. She does not appear to be anemic. #3. Right upper quadrant abdominal pain. This pain could be due to peptic ulcer disease or renal cholelithiasis or SOD dysfunction in the setting of chronic narcotic therapy.   Plan:  Patient advised to discontinue diclofenac. She will go to the lab for CBC and LFTs. Upper abdominal ultrasound. Diagnostic esophagogastroduodenoscopy under monitored anesthesia care.

## 2015-12-13 NOTE — Patient Instructions (Signed)
Physician will call with results of blood tests when completed. EGD to be scheduled. Abdominal ultrasound to be scheduled.

## 2015-12-15 ENCOUNTER — Ambulatory Visit (INDEPENDENT_AMBULATORY_CARE_PROVIDER_SITE_OTHER): Payer: Medicare Other | Admitting: Orthopaedic Surgery

## 2015-12-15 ENCOUNTER — Encounter: Payer: Self-pay | Admitting: Orthopaedic Surgery

## 2015-12-15 VITALS — BP 122/77 | HR 79 | Temp 97.7°F | Ht 61.0 in | Wt 179.0 lb

## 2015-12-15 DIAGNOSIS — Z720 Tobacco use: Secondary | ICD-10-CM

## 2015-12-15 DIAGNOSIS — F17210 Nicotine dependence, cigarettes, uncomplicated: Secondary | ICD-10-CM | POA: Diagnosis not present

## 2015-12-15 DIAGNOSIS — M25512 Pain in left shoulder: Secondary | ICD-10-CM

## 2015-12-15 DIAGNOSIS — F17200 Nicotine dependence, unspecified, uncomplicated: Secondary | ICD-10-CM

## 2015-12-15 MED ORDER — OXYCODONE-ACETAMINOPHEN 10-325 MG PO TABS: ORAL_TABLET | ORAL | 0 refills | Status: DC

## 2015-12-15 NOTE — Progress Notes (Signed)
CC:  My shoulder hurts  She has pain in the left shoulder. She desires an injection.  Motion is decreased.  Crepitus is present.  No effusion is present. NV intact.  Encounter Diagnoses  Name Primary?  . Left shoulder pain Yes  . Tobacco smoker within last 12 months   . Cigarette nicotine dependence without complication    I have talked to her about her smoking.  She is not willing to quit but willing to cut back.   PROCEDURE NOTE:  The patient request injection, verbal consent was obtained.  The left shoulder was prepped appropriately after time out was performed.   Sterile technique was observed and injection of 1 cc of Depo-Medrol 40 mg with several cc's of plain xylocaine. Anesthesia was provided by ethyl chloride and a 20-gauge needle was used to inject the shoulder area. A posterior approach was used.  The injection was tolerated well.  A band aid dressing was applied.  The patient was advised to apply ice later today and tomorrow to the injection sight as needed.  Return in one month.  Call if any problem.  Precautions discussed.   Electronically Signed Sanjuana Kava, MD 9/14/201710:23 AM

## 2015-12-16 NOTE — Patient Instructions (Addendum)
[name]  12/16/2015     @PREFPERIOPPHARMACY @   Your procedure is scheduled on 12/23/2015.  Report to [facility] at 10:00 A.M.  Call this number if you have problems the morning of surgery:  [phone]   Remember:  Do not eat food or drink liquids after midnight.  Take these medicines the morning of surgery with A SIP OF WATER : Nexium, Synthroid, Ativan, Metoprolol, Oxycodone, Lyrica Savella and Topamax  Do not wear jewelry, make-up or nail polish.  Do not wear lotions, powders, or perfumes, or deoderant.  Do not shave 48 hours prior to surgery.  Men may shave face and neck.  Do not bring valuables to the hospital.  [facility] is not responsible for any belongings or valuables.  Contacts, dentures or bridgework may not be worn into surgery.  Leave your suitcase in the car.  After surgery it may be brought to your room.  For patients admitted to the hospital, discharge time will be determined by your treatment team.  Patients discharged the day of surgery will not be allowed to drive home.   Name and phone number of your driver:   family Special instructions:  n/a  Please read over the following fact sheets that you were given. Care and Recovery After Surgery   Esophagogastroduodenoscopy Esophagogastroduodenoscopy (EGD) is a procedure that is used to examine the lining of the esophagus, stomach, and first part of the small intestine (duodenum). A long, flexible, lighted tube with a camera attached (endoscope) is inserted down the throat to view these organs. This procedure is done to detect problems or abnormalities, such as inflammation, bleeding, ulcers, or growths, in order to treat them. The procedure lasts 5-20 minutes. It is usually an outpatient procedure, but it may need to be performed in a hospital in emergency cases. LET [facility] CARE PROVIDER KNOW ABOUT:  Any allergies you have.  All medicines you are taking, including vitamins, herbs, eye drops,  creams, and over-the-counter medicines.  Previous problems you or members of your family have had with the use of anesthetics.  Any blood disorders you have.  Previous surgeries you have had.  Medical conditions you have. RISKS AND COMPLICATIONS Generally, this is a safe procedure. However, problems can occur and include:  Infection.  Bleeding.  Tearing (perforation) of the esophagus, stomach, or duodenum.  Difficulty breathing or not being able to breathe.  Excessive sweating.  Spasms of the larynx.  Slowed heartbeat.  Low blood pressure. BEFORE THE PROCEDURE  Do not eat or drink anything after midnight on the night before the procedure or as directed by your health care provider.  Do not take your regular medicines before the procedure if your health care provider asks you not to. Ask your health care provider about changing or stopping those medicines.  If you wear dentures, be prepared to remove them before the procedure.  Arrange for someone to drive you home after the procedure. PROCEDURE  A numbing medicine (local anesthetic) may be sprayed in your throat for comfort and to stop you from gagging or coughing.  You will have an IV tube inserted in a vein in your hand or arm. You will receive medicines and fluids through this tube.  You will be given a medicine to relax you (sedative).  A pain reliever will be given through the IV tube.  A mouth guard may be placed in your mouth to protect your teeth and to keep you from biting on the endoscope.  You will be asked to lie on your left side.  The endoscope will be inserted down your throat and into your esophagus, stomach, and duodenum.  Air will be put through the endoscope to allow your health care provider to clearly view the lining of your esophagus.  The lining of your esophagus, stomach, and duodenum will be examined. During the exam, your health care provider may:  Remove tissue to be examined under a  microscope (biopsy) for inflammation, infection, or other medical problems.  Remove growths.  Remove objects (foreign bodies) that are stuck.  Treat any bleeding with medicines or other devices that stop tissues from bleeding (hot cautery, clipping devices).  Widen (dilate) or stretch narrowed areas of your esophagus and stomach.  The endoscope will be withdrawn. AFTER THE PROCEDURE  You will be taken to a recovery area for observation. Your blood pressure, heart rate, breathing rate, and blood oxygen level will be monitored often until the medicines you were given have worn off.  Do not eat or drink anything until the numbing medicine has worn off and your gag reflex has returned. You may choke.  Your health care provider should be able to discuss his or her findings with you. It will take longer to discuss the test results if any biopsies were taken.   This information is not intended to replace advice given to you by your health care provider. Make sure you discuss any questions you have with your health care provider.   Document Released: 07/20/2004 Document Revised: 04/09/2014 Document Reviewed: 02/20/2012 Elsevier Interactive Patient Education [employer].

## 2015-12-20 ENCOUNTER — Other Ambulatory Visit: Payer: Self-pay

## 2015-12-20 ENCOUNTER — Encounter (HOSPITAL_COMMUNITY): Payer: Self-pay

## 2015-12-20 ENCOUNTER — Encounter (HOSPITAL_COMMUNITY)
Admission: RE | Admit: 2015-12-20 | Discharge: 2015-12-20 | Disposition: A | Discharge status: Home / Self Care | Payer: Medicare Other | Source: Ambulatory Visit | Attending: Internal Medicine | Admitting: Internal Medicine

## 2015-12-20 DIAGNOSIS — G4730 Sleep apnea, unspecified: Secondary | ICD-10-CM | POA: Diagnosis not present

## 2015-12-20 DIAGNOSIS — E119 Type 2 diabetes mellitus without complications: Secondary | ICD-10-CM | POA: Diagnosis not present

## 2015-12-20 DIAGNOSIS — R112 Nausea with vomiting, unspecified: Secondary | ICD-10-CM

## 2015-12-20 DIAGNOSIS — M1990 Unspecified osteoarthritis, unspecified site: Secondary | ICD-10-CM | POA: Diagnosis not present

## 2015-12-20 DIAGNOSIS — K219 Gastro-esophageal reflux disease without esophagitis: Secondary | ICD-10-CM | POA: Diagnosis not present

## 2015-12-20 DIAGNOSIS — K3189 Other diseases of stomach and duodenum: Secondary | ICD-10-CM | POA: Diagnosis not present

## 2015-12-20 DIAGNOSIS — R1011 Right upper quadrant pain: Secondary | ICD-10-CM | POA: Diagnosis not present

## 2015-12-20 DIAGNOSIS — K766 Portal hypertension: Secondary | ICD-10-CM | POA: Diagnosis not present

## 2015-12-20 DIAGNOSIS — F17200 Nicotine dependence, unspecified, uncomplicated: Secondary | ICD-10-CM | POA: Diagnosis not present

## 2015-12-20 DIAGNOSIS — Z79899 Other long term (current) drug therapy: Secondary | ICD-10-CM | POA: Diagnosis not present

## 2015-12-20 DIAGNOSIS — Z7984 Long term (current) use of oral hypoglycemic drugs: Secondary | ICD-10-CM | POA: Diagnosis not present

## 2015-12-20 DIAGNOSIS — Z89431 Acquired absence of right foot: Secondary | ICD-10-CM | POA: Diagnosis not present

## 2015-12-20 DIAGNOSIS — E039 Hypothyroidism, unspecified: Secondary | ICD-10-CM | POA: Diagnosis not present

## 2015-12-20 DIAGNOSIS — K2950 Unspecified chronic gastritis without bleeding: Secondary | ICD-10-CM | POA: Diagnosis not present

## 2015-12-20 DIAGNOSIS — I10 Essential (primary) hypertension: Secondary | ICD-10-CM | POA: Diagnosis not present

## 2015-12-20 DIAGNOSIS — K920 Hematemesis: Secondary | ICD-10-CM

## 2015-12-20 HISTORY — DX: Major depressive disorder, single episode, unspecified: F32.9

## 2015-12-20 HISTORY — DX: Depression, unspecified: F32.A

## 2015-12-20 LAB — BASIC METABOLIC PANEL
ANION GAP: 2 — AB (ref 5–15)
BUN: 12 mg/dL (ref 6–20)
CALCIUM: 8.5 mg/dL — AB (ref 8.9–10.3)
CO2: 26 mmol/L (ref 22–32)
Chloride: 111 mmol/L (ref 101–111)
Creatinine, Ser: 0.66 mg/dL (ref 0.44–1.00)
GFR calc Af Amer: >60 mL/min (ref >60)
GLUCOSE: 88 mg/dL (ref 65–99)
POTASSIUM: 3.9 mmol/L (ref 3.5–5.1)
SODIUM: 139 mmol/L (ref 135–145)

## 2015-12-23 ENCOUNTER — Encounter (HOSPITAL_COMMUNITY)
Admission: RE | Disposition: A | Discharge status: Home / Self Care | Payer: Self-pay | Source: Ambulatory Visit | Attending: Internal Medicine

## 2015-12-23 ENCOUNTER — Ambulatory Visit (HOSPITAL_COMMUNITY): Payer: Medicare Other | Admitting: Anesthesiology

## 2015-12-23 ENCOUNTER — Ambulatory Visit (HOSPITAL_COMMUNITY)
Admission: RE | Admit: 2015-12-23 | Discharge: 2015-12-23 | Disposition: A | Discharge status: Home / Self Care | Payer: Medicare Other | Source: Ambulatory Visit | Attending: Internal Medicine | Admitting: Internal Medicine

## 2015-12-23 DIAGNOSIS — K920 Hematemesis: Secondary | ICD-10-CM

## 2015-12-23 DIAGNOSIS — R112 Nausea with vomiting, unspecified: Secondary | ICD-10-CM | POA: Insufficient documentation

## 2015-12-23 DIAGNOSIS — K2950 Unspecified chronic gastritis without bleeding: Secondary | ICD-10-CM | POA: Diagnosis not present

## 2015-12-23 DIAGNOSIS — R1013 Epigastric pain: Secondary | ICD-10-CM | POA: Diagnosis not present

## 2015-12-23 DIAGNOSIS — K766 Portal hypertension: Secondary | ICD-10-CM | POA: Diagnosis not present

## 2015-12-23 DIAGNOSIS — K3189 Other diseases of stomach and duodenum: Secondary | ICD-10-CM | POA: Diagnosis not present

## 2015-12-23 DIAGNOSIS — M1990 Unspecified osteoarthritis, unspecified site: Secondary | ICD-10-CM | POA: Insufficient documentation

## 2015-12-23 DIAGNOSIS — E119 Type 2 diabetes mellitus without complications: Secondary | ICD-10-CM | POA: Insufficient documentation

## 2015-12-23 DIAGNOSIS — I10 Essential (primary) hypertension: Secondary | ICD-10-CM | POA: Insufficient documentation

## 2015-12-23 DIAGNOSIS — R1011 Right upper quadrant pain: Secondary | ICD-10-CM | POA: Diagnosis not present

## 2015-12-23 DIAGNOSIS — K219 Gastro-esophageal reflux disease without esophagitis: Secondary | ICD-10-CM | POA: Insufficient documentation

## 2015-12-23 DIAGNOSIS — Z79899 Other long term (current) drug therapy: Secondary | ICD-10-CM | POA: Insufficient documentation

## 2015-12-23 DIAGNOSIS — E039 Hypothyroidism, unspecified: Secondary | ICD-10-CM | POA: Insufficient documentation

## 2015-12-23 DIAGNOSIS — Z7984 Long term (current) use of oral hypoglycemic drugs: Secondary | ICD-10-CM | POA: Insufficient documentation

## 2015-12-23 DIAGNOSIS — Z89431 Acquired absence of right foot: Secondary | ICD-10-CM | POA: Insufficient documentation

## 2015-12-23 DIAGNOSIS — G4730 Sleep apnea, unspecified: Secondary | ICD-10-CM | POA: Insufficient documentation

## 2015-12-23 DIAGNOSIS — F17200 Nicotine dependence, unspecified, uncomplicated: Secondary | ICD-10-CM | POA: Insufficient documentation

## 2015-12-23 HISTORY — PX: ESOPHAGOGASTRODUODENOSCOPY (EGD) WITH PROPOFOL: SHX5813

## 2015-12-23 HISTORY — PX: BIOPSY: SHX5522

## 2015-12-23 LAB — GLUCOSE, CAPILLARY
Glucose-Capillary: 102 mg/dL — ABNORMAL HIGH (ref 65–99)
Glucose-Capillary: 78 mg/dL (ref 65–99)

## 2015-12-23 SURGERY — ESOPHAGOGASTRODUODENOSCOPY (EGD) WITH PROPOFOL
Anesthesia: Monitor Anesthesia Care

## 2015-12-23 MED ORDER — BUTAMBEN-TETRACAINE-BENZOCAINE 2-2-14 % EX AERO
1.0000 | INHALATION_SPRAY | Freq: Once | CUTANEOUS | Status: AC
  Administered 2015-12-23: 1 via TOPICAL

## 2015-12-23 MED ORDER — PROPOFOL 500 MG/50ML IV EMUL
INTRAVENOUS | Status: DC | PRN
  Administered 2015-12-23: 100 ug/kg/min via INTRAVENOUS

## 2015-12-23 MED ORDER — HYDROMORPHONE HCL 1 MG/ML IJ SOLN: 0.2500 mg | INTRAMUSCULAR | Status: DC | PRN

## 2015-12-23 MED ORDER — LACTATED RINGERS IV SOLN
INTRAVENOUS | Status: DC
  Administered 2015-12-23: 12:00:00 via INTRAVENOUS

## 2015-12-23 MED ORDER — FENTANYL CITRATE (PF) 100 MCG/2ML IJ SOLN
25.0000 ug | INTRAMUSCULAR | Status: AC | PRN
  Administered 2015-12-23 (×2): 25 ug via INTRAVENOUS

## 2015-12-23 MED ORDER — FENTANYL CITRATE (PF) 100 MCG/2ML IJ SOLN
INTRAMUSCULAR | Status: AC
  Filled 2015-12-23: qty 2

## 2015-12-23 MED ORDER — SUCRALFATE 1 G PO TABS: 1.0000 g | ORAL_TABLET | Freq: Three times a day (TID) | ORAL | 1 refills | Status: DC

## 2015-12-23 MED ORDER — MIDAZOLAM HCL 2 MG/2ML IJ SOLN
INTRAMUSCULAR | Status: AC
  Filled 2015-12-23: qty 2

## 2015-12-23 MED ORDER — MIDAZOLAM HCL 2 MG/2ML IJ SOLN
1.0000 mg | INTRAMUSCULAR | Status: DC | PRN
  Administered 2015-12-23 (×2): 2 mg via INTRAVENOUS
  Filled 2015-12-23 (×2): qty 2

## 2015-12-23 MED ORDER — MIDAZOLAM HCL 5 MG/5ML IJ SOLN
INTRAMUSCULAR | Status: DC | PRN
  Administered 2015-12-23: 2 mg via INTRAVENOUS

## 2015-12-23 NOTE — Op Note (Signed)
[facility] Patient Name: [name] Procedure Date: 12/23/2015 1:41 PM MRN: [id number] Date of Birth: [birth year] Attending MD: Hildred Laser , MD CSN: [id number] Age: 55 Admit Type: Outpatient Procedure:                Upper GI endoscopy Indications:              Epigastric abdominal pain, Abdominal pain in the                            right upper quadrant, Hematemesis, Nausea with                            vomiting Providers:                Hildred Laser, MD, Lurline Del, RN, Purcell Nails. [place],                            Technician Referring MD:             Stoney Bang MD, MD Medicines:                Cetacaine spray, Propofol per Anesthesia Complications:            No immediate complications. Estimated Blood Loss:     Estimated blood loss was minimal. Procedure:                Pre-Anesthesia Assessment:                           - Prior to the procedure, a History and Physical                            was performed, and patient medications and                            allergies were reviewed. The patient's tolerance of                            previous anesthesia was also reviewed. The risks                            and benefits of the procedure and the sedation                            options and risks were discussed with the patient.                            All questions were answered, and informed consent                            was obtained. Prior Anticoagulants: The patient                            last took previous NSAID medication 7 days prior to  the procedure. ASA Grade Assessment: III - A                            patient with severe systemic disease. After                            reviewing the risks and benefits, the patient was                            deemed in satisfactory condition to undergo the                            procedure.                           After obtaining informed consent, the endoscope  was                            passed under direct vision. Throughout the                            procedure, the patient's blood pressure, pulse, and                            oxygen saturations were monitored continuously. The                            [id number]) scope was introduced through the                            mouth, and advanced to the second part of duodenum.                            The upper GI endoscopy was accomplished without                            difficulty. The patient tolerated the procedure                            well. Scope In: 1:44:52 PM Scope Out: 1:52:18 PM Total Procedure Duration: 0 hours 7 minutes 26 seconds  Findings:      The examined esophagus was normal.      The Z-line was regular and was found 41 cm from the incisors.      Mild portal hypertensive gastropathy was found in the gastric body.      Two localized spots with no bleeding and no stigmata of recent bleeding       were found in the prepyloric region of the stomach. Biopsies were taken       with a cold forceps for histology.      The exam was otherwise without abnormality.      The duodenal bulb and second portion of the duodenum were normal. Impression:               - Normal esophagus.                           -  Z-line regular, 41 cm from the incisors.                           - Portal hypertensive gastropathy.                           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                           - The examination was otherwise normal.                           - Normal duodenal bulb and second portion of the                            duodenum.                           Comment: Suspect most of her symptoms are due to                            gastroparesis. Moderate Sedation:      Per Anesthesia Care Recommendation:           - Patient has a contact number available for                             emergencies. The signs and symptoms of potential                            delayed complications were discussed with the                            patient. Return to normal activities tomorrow.                            Written discharge instructions were provided to the                            patient.                           - Resume previous diet today.                           - Continue present medications.                           - Use sucralfate tablets 1 gram PO QID.                           - Await pathology results. Procedure Code(s):        --- Professional ---  [id number], Esophagogastroduodenoscopy, flexible,                            transoral; with biopsy, single or multiple Diagnosis Code(s):        --- Professional ---                           K76.6, Portal hypertension                           K31.89, Other diseases of stomach and duodenum                           R10.13, Epigastric pain                           R10.11, Right upper quadrant pain                           K92.0, Hematemesis                           R11.2, Nausea with vomiting, unspecified CPT copyright 2016 American Medical Association. All rights reserved. The codes documented in this report are preliminary and upon coder review may  be revised to meet current compliance requirements. Hildred Laser, MD Hildred Laser, MD 12/23/2015 2:55:49 PM This report has been signed electronically. Number of Addenda: 0

## 2015-12-23 NOTE — Progress Notes (Signed)
Continues c/o of chronic pain. Wants to go home for her pain medication. Up to w/c. Transferred to postop in good condition .

## 2015-12-23 NOTE — Anesthesia Preprocedure Evaluation (Signed)
Anesthesia Evaluation  Patient identified by MRN, date of birth, ID band Patient awake    Reviewed: Allergy & Precautions, H&P , NPO status , Patient's Chart, lab work & pertinent test results, reviewed documented beta blocker date and time   History of Anesthesia Complications Negative for: history of anesthetic complications  Airway Mallampati: II  TM Distance: >3 FB     Dental  (+) Edentulous Upper, Edentulous Lower   Pulmonary sleep apnea (not using CPAP) , Current Smoker,    breath sounds clear to auscultation       Cardiovascular hypertension, Pt. on medications  Rhythm:Regular Rate:Normal     Neuro/Psych  Headaches, PSYCHIATRIC DISORDERS Depression    GI/Hepatic GERD  ,  Endo/Other  diabetes, Well Controlled, Type 2, Oral Hypoglycemic AgentsHypothyroidism   Renal/GU      Musculoskeletal  (+) Arthritis  (Fentanyl patch),   Abdominal   Peds  Hematology   Anesthesia Other Findings   Reproductive/Obstetrics                             Anesthesia Physical Anesthesia Plan  ASA: III  Anesthesia Plan: MAC   Post-op Pain Management:    Induction: Intravenous  Airway Management Planned: Simple Face Mask  Additional Equipment:   Intra-op Plan:   Post-operative Plan:   Informed Consent: I have reviewed the patients History and Physical, chart, labs and discussed the procedure including the risks, benefits and alternatives for the proposed anesthesia with the patient or authorized representative who has indicated his/her understanding and acceptance.     Plan Discussed with:   Anesthesia Plan Comments:         Anesthesia Quick Evaluation

## 2015-12-23 NOTE — Anesthesia Postprocedure Evaluation (Signed)
Anesthesia Post Note  Patient: [name]  Procedure(s) Performed: Procedure(s) (LRB): ESOPHAGOGASTRODUODENOSCOPY (EGD) WITH PROPOFOL (N/A) BIOPSY  Patient location during evaluation: PACU Anesthesia Type: MAC Level of consciousness: awake, oriented and patient cooperative Pain management: pain level controlled Vital Signs Assessment: post-procedure vital signs reviewed and stable Respiratory status: spontaneous breathing, nonlabored ventilation and respiratory function stable Cardiovascular status: blood pressure returned to baseline and stable Postop Assessment: no signs of nausea or vomiting Anesthetic complications: no    Last Vitals:  Vitals:   12/23/15 1255 12/23/15 1300  BP: 114/72 118/71  Pulse:    Resp: 13 17  Temp:      Last Pain:  Vitals:   12/23/15 1335  TempSrc:   PainSc: 8                  Lareina Espino J

## 2015-12-23 NOTE — H&P (Signed)
[name] is an 55 y.o. female.   Chief Complaint: Issues here for EGD. HPI: Patient is 55 year old Caucasian female with multiple medical problems who was seen in the office on 12/13/2015 with multiple complaints including abdominal pain nausea and vomiting. She also has had few episodes of hematemesis and melena. Her stool was noted to be heme positive. She had normal CBC and LFTs. Ultrasound reveals prominent bile duct normal for patient visit cholecystectomy. No evidence of choledocholithiasis. Patient was on diclofenac and was advised to stop this medication. She is here for diagnostic EGD under monitored anesthesia care. She also complains of neck and back pain as well as lower extremity pain. These symptoms are chronic.  Past Medical History:  Diagnosis Date  . Back pain   . Depression   . GERD (gastroesophageal reflux disease)   . Hypertension    diet control  . Hypothyroid   . IBS (irritable bowel syndrome)   . Migraines   . Obesity   . Osteoarthritis   . Sleep apnea     Past Surgical History:  Procedure Laterality Date  . AMPUTATION  12/13/2011   Procedure: AMPUTATION DIGIT;  Surgeon: Marcheta Grammes, DPM;  Location: [facility] ORS;  Service: Orthopedics;  Laterality: Right;  amputation second toe right foot  . BACK SURGERY    . BREAST BIOPSY    . CHOLECYSTECTOMY    . ESOPHAGOGASTRODUODENOSCOPY  05/28/2011   Procedure: ESOPHAGOGASTRODUODENOSCOPY (EGD);  Surgeon: Rogene Houston, MD;  Location: [facility];  Service: Endoscopy;  Laterality: N/A;  730  . ESOPHAGOGASTRODUODENOSCOPY N/A 10/09/2013   Procedure: ESOPHAGOGASTRODUODENOSCOPY (EGD);  Surgeon: Rogene Houston, MD;  Location: [facility];  Service: Endoscopy;  Laterality: N/A;  730  . [place] N/A 10/09/2013   Procedure: Venia Minks DILATION;  Surgeon: Rogene Houston, MD;  Location: [facility];  Service: Endoscopy;  Laterality: N/A;  . NASAL SEPTUM SURGERY    . NECK SURGERY     2  yrs ago  . TOTAL ABDOMINAL HYSTERECTOMY     Left oophroectomy for a lare tumor about 21 yrs. RT ovary removed time of hysterectomy    No family history on file. Social History:  reports that she has been smoking.  She has a 17.50 pack-year smoking history. She has never used smokeless tobacco. She reports that she does not drink alcohol or use drugs.  Allergies:  Allergies  Allergen Reactions  . Cephalexin Hives    unknown  . Latex Rash  . Betadine [Povidone Iodine] Rash  . Cleocin [Clindamycin Hcl] Hives  . Sulfa Antibiotics     Vomiting     Medications Prior to Admission  Medication Sig Dispense Refill  . beta carotene w/minerals (OCUVITE) tablet Take 1 tablet by mouth daily.      Marland Kitchen esomeprazole (NEXIUM) 40 MG capsule Take 1 capsule (40 mg total) by mouth daily before breakfast.    . fentaNYL (DURAGESIC - DOSED MCG/HR) 100 MCG/HR Place 1 patch onto the skin every 3 (three) days.      . furosemide (LASIX) 40 MG tablet Take 40 mg by mouth 3 (three) times daily.      Marland Kitchen levothyroxine (SYNTHROID, LEVOTHROID) 137 MCG tablet Take 137 mcg by mouth daily before breakfast.    . metoprolol tartrate (LOPRESSOR) 25 MG tablet Take 25 mg by mouth 2 (two) times daily.     . Milnacipran (SAVELLA) 50 MG TABS tablet Take 50 mg by mouth 2 (  two) times daily.    . Multiple Vitamins-Iron (MULTIVITAMINS WITH IRON) TABS Take 1 tablet by mouth daily.    . Oxycodone HCl 10 MG TABS Take 1 tablet (10 mg total) by mouth every 4 (four) hours as needed (Must last 30 days.). pain 170 tablet 0  . polyethylene glycol (MIRALAX / GLYCOLAX) packet Take 17 g by mouth 2 (two) times daily before a meal.      . pregabalin (LYRICA) 100 MG capsule Take 100 mg by mouth 3 (three) times daily.      Marland Kitchen tiZANidine (ZANAFLEX) 4 MG capsule Take 4 mg by mouth every 6 (six) hours.     . topiramate (TOPAMAX) 100 MG tablet Take 100 mg by mouth 2 (two) times daily.      . traMADol (ULTRAM) 50 MG tablet Take 50 mg by mouth at bedtime.      . traZODone (DESYREL) 50 MG tablet Take 50 mg by mouth at bedtime.    Marland Kitchen zolpidem (AMBIEN) 10 MG tablet Take 10 mg by mouth at bedtime.     Marland Kitchen LORazepam (ATIVAN) 0.5 MG tablet Take 0.5 mg by mouth every 8 (eight) hours.    Marland Kitchen oxyCODONE-acetaminophen (PERCOCET) 10-325 MG tablet One every four hours as needed for pain.  Must last 30 days. 180 tablet 0    Results for orders placed or performed during the hospital encounter of 12/23/15 (from the past 48 hour(s))  Glucose, capillary     Status: Abnormal   Collection Time: 12/23/15 11:45 AM  Result Value Ref Range   Glucose-Capillary 102 (H) 65 - 99 mg/dL   No results found.  ROS  Blood pressure 113/68, pulse 63, temperature 98.1 F (36.7 C), temperature source Oral, resp. rate 17, SpO2 97 %. Physical Exam  Constitutional: She appears well-developed and well-nourished.  Stooping posture  HENT:  Mouth/Throat: Oropharynx is clear and moist.  Eyes: Conjunctivae are normal. No scleral icterus.  Neck: No thyromegaly present.  Cardiovascular: Normal rate, regular rhythm and normal heart sounds.   No murmur heard. Respiratory: Effort normal and breath sounds normal.  GI:  Abdomen is full but soft with mild tenderness at epigastrium and below the right costal margin. Organomegaly or masses  Musculoskeletal: She exhibits edema.  Lymphadenopathy:    She has no cervical adenopathy.  Neurological: She is alert.  Skin: Skin is warm and dry.     Assessment/Plan Chronic nausea vomiting and upper abdominal pain. History of hematemesis and melena. Diagnostic EGD under monitored anesthesia care.  Hildred Laser, MD 12/23/2015, 1:24 PM

## 2015-12-23 NOTE — Transfer of Care (Signed)
Immediate Anesthesia Transfer of Care Note  Patient: [name]  Procedure(s) Performed: Procedure(s) with comments: ESOPHAGOGASTRODUODENOSCOPY (EGD) WITH PROPOFOL (N/A) BIOPSY - pre pyloric patches  Patient Location: PACU  Anesthesia Type:MAC  Level of Consciousness: awake and patient cooperative  Airway & Oxygen Therapy: Patient Spontanous Breathing and Patient connected to face mask oxygen  Post-op Assessment: Report given to RN, Post -op Vital signs reviewed and stable and Patient moving all extremities  Post vital signs: Reviewed and stable  Last Vitals:  Vitals:   12/23/15 1255 12/23/15 1300  BP: 114/72 118/71  Pulse:    Resp: 13 17  Temp:      Last Pain:  Vitals:   12/23/15 1335  TempSrc:   PainSc: 8       Patients Stated Pain Goal: 7 ([id number])  Complications: No apparent anesthesia complications

## 2015-12-23 NOTE — Discharge Instructions (Signed)
Resume usual medications and diet. Sucralfate 1 g by mouth 30-60 minutes before each meal and at bedtime. No driving for 24 hours. Physician will call with results of biopsy and further recommendations.  Esophagogastroduodenoscopy, Care After Refer to this sheet in the next few weeks. These instructions provide you with information about caring for yourself after your procedure. Your health care provider may also give you more specific instructions. Your treatment has been planned according to current medical practices, but problems sometimes occur. Call your health care provider if you have any problems or questions after your procedure. WHAT TO EXPECT AFTER THE PROCEDURE After your procedure, it is typical to feel:  Soreness in your throat.  Pain with swallowing.  Sick to your stomach (nauseous).  Bloated.  Dizzy.  Fatigued. HOME CARE INSTRUCTIONS  Do not eat or drink anything until the numbing medicine (local anesthetic) has worn off and your gag reflex has returned. You will know that the local anesthetic has worn off when you can swallow comfortably.  Do not drive or operate machinery until directed by your health care provider.  Take medicines only as directed by your health care provider. SEEK MEDICAL CARE IF:   You cannot stop coughing.  You are not urinating at all or less than usual. SEEK IMMEDIATE MEDICAL CARE IF:  You have difficulty swallowing.  You cannot eat or drink.  You have worsening throat or chest pain.  You have dizziness or lightheadedness or you faint.  You have nausea or vomiting.  You have chills.  You have a fever.  You have severe abdominal pain.  You have black, tarry, or bloody stools.   This information is not intended to replace advice given to you by your health care provider. Make sure you discuss any questions you have with your health care provider.   Document Released: 03/05/2012 Document Revised: 04/09/2014 Document  Reviewed: 03/05/2012 Elsevier Interactive Patient Education [employer].

## 2015-12-23 NOTE — Progress Notes (Signed)
From ENDO-3. Awake. Moaning/groaning. C/O chronic neck and back pain. "Nobody knows how much pain I am in". Wants to go home. Sprite given to drink. Tolerated well.

## 2015-12-26 NOTE — Addendum Note (Signed)
Addendum  created 12/26/15 0947 by Vista Deck, CRNA   Charge Capture section accepted

## 2015-12-27 ENCOUNTER — Encounter (INDEPENDENT_AMBULATORY_CARE_PROVIDER_SITE_OTHER): Payer: Self-pay

## 2015-12-28 ENCOUNTER — Encounter (HOSPITAL_COMMUNITY): Payer: Self-pay | Admitting: Internal Medicine

## 2015-12-29 ENCOUNTER — Telehealth: Payer: Self-pay | Admitting: Orthopaedic Surgery

## 2015-12-29 MED ORDER — OXYCODONE HCL 10 MG PO TABS: 10.0000 mg | ORAL_TABLET | ORAL | 0 refills | Status: DC | PRN

## 2015-12-29 NOTE — Telephone Encounter (Signed)
Patient requests a refill on Oxycodone HCI 10 mgs.  Qty 170  Sig: Take 1 tablet (10 mg total) by mouth every 4 (four) hours as needed (Must last 30 days.). pain

## 2016-01-10 ENCOUNTER — Encounter (INDEPENDENT_AMBULATORY_CARE_PROVIDER_SITE_OTHER): Payer: Self-pay | Admitting: Internal Medicine

## 2016-01-17 ENCOUNTER — Encounter: Payer: Self-pay | Admitting: Orthopaedic Surgery

## 2016-01-17 ENCOUNTER — Ambulatory Visit (INDEPENDENT_AMBULATORY_CARE_PROVIDER_SITE_OTHER): Payer: Medicare Other | Admitting: Orthopaedic Surgery

## 2016-01-17 DIAGNOSIS — M25512 Pain in left shoulder: Secondary | ICD-10-CM

## 2016-01-17 DIAGNOSIS — M545 Low back pain, unspecified: Secondary | ICD-10-CM

## 2016-01-17 DIAGNOSIS — G8929 Other chronic pain: Secondary | ICD-10-CM | POA: Diagnosis not present

## 2016-01-17 MED ORDER — OXYCODONE-ACETAMINOPHEN 10-325 MG PO TABS: ORAL_TABLET | ORAL | 0 refills | Status: DC

## 2016-01-17 NOTE — Progress Notes (Signed)
Patient [id number] [name], female DOB:[birth year], 55 y.o. [id number]  Chief Complaint  Patient presents with  . Follow-up    Shoulder and back pain    HPI  [name] is a 55 y.o. female who has chronic lower back and left shoulder pain.  She is stable.She has no paresthesias. She has no new trauma. She has been doing her exercises. HPI  There is no height or weight on file to calculate BMI.  ROS  Review of Systems  Constitutional:       Patient does not have Diabetes Mellitus. Patient has hypertension. Patient has COPD or shortness of breath. Patient has BMI > 35. Patient has current smoking history.  HENT: Negative for congestion.   Respiratory: Positive for cough and shortness of breath.        Sleep apnea  Cardiovascular: Negative for chest pain.  Gastrointestinal:       GERD  Endocrine: Positive for cold intolerance.  Musculoskeletal: Positive for arthralgias and back pain.  Allergic/Immunologic: Positive for environmental allergies.  Neurological: Positive for headaches.    Past Medical History:  Diagnosis Date  . Back pain   . Depression   . GERD (gastroesophageal reflux disease)   . Hypertension    diet control  . Hypothyroid   . IBS (irritable bowel syndrome)   . Migraines   . Obesity   . Osteoarthritis   . Sleep apnea     Past Surgical History:  Procedure Laterality Date  . AMPUTATION  12/13/2011   Procedure: AMPUTATION DIGIT;  Surgeon: Marcheta Grammes, DPM;  Location: [facility];  Service: Orthopedics;  Laterality: Right;  amputation second toe right foot  . BACK SURGERY    . BIOPSY  12/23/2015   Procedure: BIOPSY;  Surgeon: Rogene Houston, MD;  Location: [facility];  Service: Endoscopy;;  pre pyloric patches  . BREAST BIOPSY    . CHOLECYSTECTOMY    . ESOPHAGOGASTRODUODENOSCOPY  05/28/2011   Procedure: ESOPHAGOGASTRODUODENOSCOPY (EGD);  Surgeon: Rogene Houston, MD;  Location: [facility];  Service: Endoscopy;  Laterality: N/A;   730  . ESOPHAGOGASTRODUODENOSCOPY N/A 10/09/2013   Procedure: ESOPHAGOGASTRODUODENOSCOPY (EGD);  Surgeon: Rogene Houston, MD;  Location: [facility];  Service: Endoscopy;  Laterality: N/A;  730  . ESOPHAGOGASTRODUODENOSCOPY (EGD) WITH PROPOFOL N/A 12/23/2015   Procedure: ESOPHAGOGASTRODUODENOSCOPY (EGD) WITH PROPOFOL;  Surgeon: Rogene Houston, MD;  Location: [facility];  Service: Endoscopy;  Laterality: N/A;  . HERNIA REPAIR    . MALONEY DILATION N/A 10/09/2013   Procedure: MALONEY DILATION;  Surgeon: Rogene Houston, MD;  Location: [facility];  Service: Endoscopy;  Laterality: N/A;  . NASAL SEPTUM SURGERY    . NECK SURGERY     2 yrs ago  . TOTAL ABDOMINAL HYSTERECTOMY     Left oophroectomy for a lare tumor about 21 yrs. RT ovary removed time of hysterectomy    History reviewed. No pertinent family history.  Social History Social History  Substance Use Topics  . Smoking status: Current Every Day Smoker    Packs/day: 0.50    Years: 35.00  . Smokeless tobacco: Never Used     Comment: 1 pack a day since age18  . Alcohol use No    Allergies  Allergen Reactions  . Cephalexin Hives    unknown  . Latex Rash  . Betadine [Povidone Iodine] Rash  . Cleocin [Clindamycin Hcl] Hives  . Sulfa Antibiotics     Vomiting     Current  Outpatient Prescriptions  Medication Sig Dispense Refill  . beta carotene w/minerals (OCUVITE) tablet Take 1 tablet by mouth daily.      Marland Kitchen esomeprazole (NEXIUM) 40 MG capsule Take 1 capsule (40 mg total) by mouth daily before breakfast.    . fentaNYL (DURAGESIC - DOSED MCG/HR) 100 MCG/HR Place 1 patch onto the skin every 3 (three) days.      . furosemide (LASIX) 40 MG tablet Take 40 mg by mouth 3 (three) times daily.      Marland Kitchen levothyroxine (SYNTHROID, LEVOTHROID) 137 MCG tablet Take 137 mcg by mouth daily before breakfast.    . LORazepam (ATIVAN) 0.5 MG tablet Take 0.5 mg by mouth every 8 (eight) hours.    . metoprolol tartrate (LOPRESSOR) 25 MG tablet  Take 25 mg by mouth 2 (two) times daily.     . Milnacipran (SAVELLA) 50 MG TABS tablet Take 50 mg by mouth 2 (two) times daily.    . Multiple Vitamins-Iron (MULTIVITAMINS WITH IRON) TABS Take 1 tablet by mouth daily.    . Oxycodone HCl 10 MG TABS Take 1 tablet (10 mg total) by mouth every 4 (four) hours as needed (Must last 30 days.). pain 170 tablet 0  . oxyCODONE-acetaminophen (PERCOCET) 10-325 MG tablet One every four hours as needed for pain.  Must last 30 days. 180 tablet 0  . polyethylene glycol (MIRALAX / GLYCOLAX) packet Take 17 g by mouth 2 (two) times daily before a meal.      . pregabalin (LYRICA) 100 MG capsule Take 100 mg by mouth 3 (three) times daily.      . sucralfate (CARAFATE) 1 g tablet Take 1 tablet (1 g total) by mouth 4 (four) times daily -  with meals and at bedtime. 120 tablet 1  . tiZANidine (ZANAFLEX) 4 MG capsule Take 4 mg by mouth every 6 (six) hours.     . topiramate (TOPAMAX) 100 MG tablet Take 100 mg by mouth 2 (two) times daily.      . traMADol (ULTRAM) 50 MG tablet Take 50 mg by mouth at bedtime.     . traZODone (DESYREL) 50 MG tablet Take 50 mg by mouth at bedtime.    Marland Kitchen zolpidem (AMBIEN) 10 MG tablet Take 10 mg by mouth at bedtime.      No current facility-administered medications for this visit.      Physical Exam  There were no vitals taken for this visit.  Constitutional: overall normal hygiene, normal nutrition, well developed, normal grooming, normal body habitus. Assistive device:cane  Musculoskeletal: gait and station Limp left, muscle tone and strength are normal, no tremors or atrophy is present.  .  Neurological: coordination overall normal.  Deep tendon reflex/nerve stretch intact.  Sensation normal.  Cranial nerves II-XII intact.   Skin:   Normal overall no scars, lesions, ulcers or rashes. No psoriasis.  Psychiatric: Alert and oriented x 3.  Recent memory intact, remote memory unclear.  Normal mood and affect. Well groomed.  Good eye  contact.  Cardiovascular: overall no swelling, no varicosities, no edema bilaterally, normal temperatures of the legs and arms, no clubbing, cyanosis and good capillary refill.  Lymphatic: palpation is normal.  Examination of left Upper Extremity is done.  Inspection:   Overall:  Elbow non-tender without crepitus or defects, forearm non-tender without crepitus or defects, wrist non-tender without crepitus or defects, hand non-tender.    Shoulder: with glenohumeral joint tenderness, without effusion.   Upper arm: without swelling and tenderness   Range of  motion:   Overall:  Full range of motion of the elbow, full range of motion of wrist and full range of motion in fingers.   Shoulder:  left  165 degrees forward flexion; 145 degrees abduction; 30 degrees internal rotation, 30 degrees external rotation, 15 degrees extension, 40 degrees adduction.   Stability:   Overall:  Shoulder, elbow and wrist stable   Strength and Tone:   Overall full shoulder muscles strength, full upper arm strength and normal upper arm bulk and tone.  Spine/Pelvis examination:  Inspection:  Overall, sacoiliac joint benign and hips nontender; without crepitus or defects.   Thoracic spine inspection: Alignment normal with kyphosis present   Lumbar spine inspection:  Alignment  with normal lumbar lordosis, without scoliosis apparent.   Thoracic spine palpation:  with tenderness of spinal processes   Lumbar spine palpation: with tenderness of lumbar area; without tightness of lumbar muscles    Range of Motion:   Lumbar flexion, forward flexion is 35 with pain or tenderness    Lumbar extension is 5 with pain or tenderness   Left lateral bend is Normal  without pain or tenderness   Right lateral bend is Normal without pain or tenderness   Straight leg raising is Normal   Strength & tone: Normal   Stability overall normal stability    The patient has been educated about the nature of the problem(s) and  counseled on treatment options.  The patient appeared to understand what I have discussed and is in agreement with it.  Encounter Diagnoses  Name Primary?  . Chronic left shoulder pain Yes  . Chronic midline low back pain without sciatica   . Morbid obesity due to excess calories [facility]     PLAN Call if any problems.  Precautions discussed.  Continue current medications.   Return to clinic 1 month   Electronically Signed Sanjuana Kava, MD 10/17/201710:45 AM

## 2016-01-31 ENCOUNTER — Telehealth: Payer: Self-pay | Admitting: Orthopaedic Surgery

## 2016-01-31 MED ORDER — OXYCODONE HCL 10 MG PO TABS: 10.0000 mg | ORAL_TABLET | ORAL | 0 refills | Status: DC | PRN

## 2016-01-31 NOTE — Telephone Encounter (Signed)
Patient requests refill on Oxycodone HCI  10  Mgs.  Qty  170   Sig: Take 1 tablet (10 mg total) by mouth every 4 (four) hours as needed (Must last 30 days.). pain

## 2016-02-09 ENCOUNTER — Ambulatory Visit (INDEPENDENT_AMBULATORY_CARE_PROVIDER_SITE_OTHER): Payer: Medicare Other | Admitting: Orthopaedic Surgery

## 2016-02-09 ENCOUNTER — Encounter: Payer: Self-pay | Admitting: Orthopaedic Surgery

## 2016-02-09 VITALS — BP 122/75 | HR 81 | Temp 97.3°F | Ht 61.0 in | Wt 174.0 lb

## 2016-02-09 DIAGNOSIS — M25512 Pain in left shoulder: Secondary | ICD-10-CM

## 2016-02-09 DIAGNOSIS — G8929 Other chronic pain: Secondary | ICD-10-CM | POA: Diagnosis not present

## 2016-02-09 DIAGNOSIS — F17210 Nicotine dependence, cigarettes, uncomplicated: Secondary | ICD-10-CM | POA: Diagnosis not present

## 2016-02-09 MED ORDER — OXYCODONE-ACETAMINOPHEN 10-325 MG PO TABS: ORAL_TABLET | ORAL | 0 refills | Status: DC

## 2016-02-09 NOTE — Patient Instructions (Signed)
Smoking Cessation, Tips for Success If you are ready to quit smoking, congratulations! You have chosen to help yourself be healthier. Cigarettes bring nicotine, tar, carbon monoxide, and other irritants into your body. Your lungs, heart, and blood vessels will be able to work better without these poisons. There are many different ways to quit smoking. Nicotine gum, nicotine patches, a nicotine inhaler, or nicotine nasal spray can help with physical craving. Hypnosis, support groups, and medicines help break the habit of smoking. WHAT THINGS CAN I DO TO MAKE QUITTING EASIER?  Here are some tips to help you quit for good:  Pick a date when you will quit smoking completely. Tell all of your friends and family about your plan to quit on that date.  Do not try to slowly cut down on the number of cigarettes you are smoking. Pick a quit date and quit smoking completely starting on that day.  Throw away all cigarettes.   Clean and remove all ashtrays from your home, work, and car.  On a card, write down your reasons for quitting. Carry the card with you and read it when you get the urge to smoke.  Cleanse your body of nicotine. Drink enough water and fluids to keep your urine clear or pale yellow. Do this after quitting to flush the nicotine from your body.  Learn to predict your moods. Do not let a bad situation be your excuse to have a cigarette. Some situations in your life might tempt you into wanting a cigarette.  Never have "just one" cigarette. It leads to wanting another and another. Remind yourself of your decision to quit.  Change habits associated with smoking. If you smoked while driving or when feeling stressed, try other activities to replace smoking. Stand up when drinking your coffee. Brush your teeth after eating. Sit in a different chair when you read the paper. Avoid alcohol while trying to quit, and try to drink fewer caffeinated beverages. Alcohol and caffeine may urge you to  smoke.  Avoid foods and drinks that can trigger a desire to smoke, such as sugary or spicy foods and alcohol.  Ask people who smoke not to smoke around you.  Have something planned to do right after eating or having a cup of coffee. For example, plan to take a walk or exercise.  Try a relaxation exercise to calm you down and decrease your stress. Remember, you may be tense and nervous for the first 2 weeks after you quit, but this will pass.  Find new activities to keep your hands busy. Play with a pen, coin, or rubber band. Doodle or draw things on paper.  Brush your teeth right after eating. This will help cut down on the craving for the taste of tobacco after meals. You can also try mouthwash.   Use oral substitutes in place of cigarettes. Try using lemon drops, carrots, cinnamon sticks, or chewing gum. Keep them handy so they are available when you have the urge to smoke.  When you have the urge to smoke, try deep breathing.  Designate your home as a nonsmoking area.  If you are a heavy smoker, ask your health care provider about a prescription for nicotine chewing gum. It can ease your withdrawal from nicotine.  Reward yourself. Set aside the cigarette money you save and buy yourself something nice.  Look for support from others. Join a support group or smoking cessation program. Ask someone at home or at work to help you with your plan   to quit smoking.  Always ask yourself, "Do I need this cigarette or is this just a reflex?" Tell yourself, "Today, I choose not to smoke," or "I do not want to smoke." You are reminding yourself of your decision to quit.  Do not replace cigarette smoking with electronic cigarettes (commonly called e-cigarettes). The safety of e-cigarettes is unknown, and some may contain harmful chemicals.  If you relapse, do not give up! Plan ahead and think about what you will do the next time you get the urge to smoke. HOW WILL I FEEL WHEN I QUIT SMOKING? You  may have symptoms of withdrawal because your body is used to nicotine (the addictive substance in cigarettes). You may crave cigarettes, be irritable, feel very hungry, cough often, get headaches, or have difficulty concentrating. The withdrawal symptoms are only temporary. They are strongest when you first quit but will go away within 10-14 days. When withdrawal symptoms occur, stay in control. Think about your reasons for quitting. Remind yourself that these are signs that your body is healing and getting used to being without cigarettes. Remember that withdrawal symptoms are easier to treat than the major diseases that smoking can cause.  Even after the withdrawal is over, expect periodic urges to smoke. However, these cravings are generally short lived and will go away whether you smoke or not. Do not smoke! WHAT RESOURCES ARE AVAILABLE TO HELP ME QUIT SMOKING? Your health care provider can direct you to community resources or hospitals for support, which may include:  Group support.  Education.  Hypnosis.  Therapy.   This information is not intended to replace advice given to you by your health care provider. Make sure you discuss any questions you have with your health care provider.   Document Released: 12/16/2003 Document Revised: 04/09/2014 Document Reviewed: 09/04/2012 Elsevier Interactive Patient Education 2016 Elsevier Inc.  

## 2016-02-09 NOTE — Progress Notes (Signed)
CC:  My shoulder is hurting again  She is having more pain of the left shoulder.  She has no new trauma.  ROM is full but tender in the extremes.  NV intact.  She continues to smoke and is not willing to stop.  Encounter Diagnoses  Name Primary?  . Chronic left shoulder pain Yes  . Morbid obesity due to excess calories ([place])   . Cigarette nicotine dependence without complication        PROCEDURE NOTE:  The patient request injection, verbal consent was obtained.  The left shoulder was prepped appropriately after time out was performed.   Sterile technique was observed and injection of 1 cc of Depo-Medrol 40 mg with several cc's of plain xylocaine. Anesthesia was provided by ethyl chloride and a 20-gauge needle was used to inject the shoulder area. A posterior approach was used.  The injection was tolerated well.  A band aid dressing was applied.  The patient was advised to apply ice later today and tomorrow to the injection sight as needed.  Return in one month.  Electronically Signed Sanjuana Kava, MD 11/9/201711:09 AM

## 2016-02-17 ENCOUNTER — Encounter (INDEPENDENT_AMBULATORY_CARE_PROVIDER_SITE_OTHER): Payer: Self-pay

## 2016-02-27 ENCOUNTER — Telehealth: Payer: Self-pay | Admitting: Orthopaedic Surgery

## 2016-02-27 NOTE — Telephone Encounter (Signed)
Oxycodone-HCI 10 mg  Qty 170 Tablets

## 2016-02-28 MED ORDER — OXYCODONE HCL 10 MG PO TABS: 10.0000 mg | ORAL_TABLET | ORAL | 0 refills | Status: DC | PRN

## 2016-03-02 ENCOUNTER — Other Ambulatory Visit (INDEPENDENT_AMBULATORY_CARE_PROVIDER_SITE_OTHER): Payer: Self-pay | Admitting: Internal Medicine

## 2016-03-06 ENCOUNTER — Telehealth (INDEPENDENT_AMBULATORY_CARE_PROVIDER_SITE_OTHER): Payer: Self-pay | Admitting: Internal Medicine

## 2016-03-06 NOTE — Telephone Encounter (Signed)
Patient called, had an appointment scheduled for 03/13/16 for a follow with Dr. Laural Golden.  She wanted to reschedule for March.  She stated that her body just doesn't work in this cold weather.  I did reschedule her for March, but I wanted to make Dr. Laural Golden aware of what is going and and why she is not coming on 03/13/16.  [phone]

## 2016-03-06 NOTE — Telephone Encounter (Signed)
Dr.Rehman was made aware. 

## 2016-03-13 ENCOUNTER — Ambulatory Visit (INDEPENDENT_AMBULATORY_CARE_PROVIDER_SITE_OTHER): Payer: Medicare Other | Admitting: Internal Medicine

## 2016-03-15 ENCOUNTER — Encounter: Payer: Self-pay | Admitting: Orthopaedic Surgery

## 2016-03-15 ENCOUNTER — Ambulatory Visit (INDEPENDENT_AMBULATORY_CARE_PROVIDER_SITE_OTHER): Payer: Medicare Other | Admitting: Orthopaedic Surgery

## 2016-03-15 VITALS — BP 137/83 | HR 77 | Temp 97.9°F | Ht 61.0 in | Wt 176.0 lb

## 2016-03-15 DIAGNOSIS — G8929 Other chronic pain: Secondary | ICD-10-CM

## 2016-03-15 DIAGNOSIS — M25512 Pain in left shoulder: Secondary | ICD-10-CM

## 2016-03-15 DIAGNOSIS — F17210 Nicotine dependence, cigarettes, uncomplicated: Secondary | ICD-10-CM | POA: Diagnosis not present

## 2016-03-15 MED ORDER — OXYCODONE-ACETAMINOPHEN 10-325 MG PO TABS: ORAL_TABLET | ORAL | 0 refills | Status: DC

## 2016-03-15 NOTE — Patient Instructions (Signed)
Steps to Quit Smoking Smoking tobacco can be bad for your health. It can also affect almost every organ in your body. Smoking puts you and people around you at risk for many serious long-lasting (chronic) diseases. Quitting smoking is hard, but it is one of the best things that you can do for your health. It is never too late to quit. What are the benefits of quitting smoking? When you quit smoking, you lower your risk for getting serious diseases and conditions. They can include:  Lung cancer or lung disease.  Heart disease.  Stroke.  Heart attack.  Not being able to have children (infertility).  Weak bones (osteoporosis) and broken bones (fractures). If you have coughing, wheezing, and shortness of breath, those symptoms may get better when you quit. You may also get sick less often. If you are pregnant, quitting smoking can help to lower your chances of having a baby of low birth weight. What can I do to help me quit smoking? Talk with your doctor about what can help you quit smoking. Some things you can do (strategies) include:  Quitting smoking totally, instead of slowly cutting back how much you smoke over a period of time.  Going to in-person counseling. You are more likely to quit if you go to many counseling sessions.  Using resources and support systems, such as:  Online chats with a counselor.  Phone quitlines.  Printed self-help materials.  Support groups or group counseling.  Text messaging programs.  Mobile phone apps or applications.  Taking medicines. Some of these medicines may have nicotine in them. If you are pregnant or breastfeeding, do not take any medicines to quit smoking unless your doctor says it is okay. Talk with your doctor about counseling or other things that can help you. Talk with your doctor about using more than one strategy at the same time, such as taking medicines while you are also going to in-person counseling. This can help make quitting  easier. What things can I do to make it easier to quit? Quitting smoking might feel very hard at first, but there is a lot that you can do to make it easier. Take these steps:  Talk to your family and friends. Ask them to support and encourage you.  Call phone quitlines, reach out to support groups, or work with a counselor.  Ask people who smoke to not smoke around you.  Avoid places that make you want (trigger) to smoke, such as:  Bars.  Parties.  Smoke-break areas at work.  Spend time with people who do not smoke.  Lower the stress in your life. Stress can make you want to smoke. Try these things to help your stress:  Getting regular exercise.  Deep-breathing exercises.  Yoga.  Meditating.  Doing a body scan. To do this, close your eyes, focus on one area of your body at a time from head to toe, and notice which parts of your body are tense. Try to relax the muscles in those areas.  Download or buy apps on your mobile phone or tablet that can help you stick to your quit plan. There are many free apps, such as QuitGuide from the CDC (Centers for Disease Control and Prevention). You can find more support from smokefree.gov and other websites. This information is not intended to replace advice given to you by your health care provider. Make sure you discuss any questions you have with your health care provider. Document Released: 01/13/2009 Document Revised: 11/15/2015 Document   Reviewed: 08/03/2014 Elsevier Interactive Patient Education  2017 Elsevier Inc.  

## 2016-03-15 NOTE — Progress Notes (Signed)
CC:  My shoulder is hurting more  She has chronic pain of the left shoulder.  She has no new trauma.  ROM of the shoulder is good but painful in the extremes.  She has no paresthesias.  NV intact.  Encounter Diagnoses  Name Primary?  . Chronic left shoulder pain Yes  . Morbid obesity due to excess calories ([place])   . Cigarette nicotine dependence without complication    She continues to smoke and does not desire to stop.  PROCEDURE NOTE:  The patient request injection, verbal consent was obtained.  The left shoulder was prepped appropriately after time out was performed.   Sterile technique was observed and injection of 1 cc of Depo-Medrol 40 mg with several cc's of plain xylocaine. Anesthesia was provided by ethyl chloride and a 20-gauge needle was used to inject the shoulder area. A posterior approach was used.  The injection was tolerated well.  A band aid dressing was applied.  The patient was advised to apply ice later today and tomorrow to the injection sight as needed.  Return in one month.  Call if any problem.  Electronically Signed Sanjuana Kava, MD 12/14/201710:20 AM

## 2016-03-19 ENCOUNTER — Telehealth: Payer: Self-pay | Admitting: Orthopaedic Surgery

## 2016-03-19 NOTE — Telephone Encounter (Signed)
C-Doxepin-Cycloben-Gabapent-Ibuprofen-Lido 1.9-2-2-2-2.25%

## 2016-03-20 ENCOUNTER — Telehealth: Payer: Self-pay | Admitting: Orthopaedic Surgery

## 2016-03-20 NOTE — Telephone Encounter (Signed)
Oxycodone HCI 10 MG  Qty  170 Tablets ° °

## 2016-03-20 NOTE — Telephone Encounter (Signed)
I called pharmacy and refilled this with 5 additional refills.

## 2016-03-21 MED ORDER — OXYCODONE HCL 10 MG PO TABS: 10.0000 mg | ORAL_TABLET | ORAL | 0 refills | Status: DC | PRN

## 2016-04-17 ENCOUNTER — Ambulatory Visit (INDEPENDENT_AMBULATORY_CARE_PROVIDER_SITE_OTHER): Payer: Medicare Other | Admitting: Orthopaedic Surgery

## 2016-04-17 VITALS — BP 124/81 | HR 76 | Temp 97.7°F | Ht 61.0 in

## 2016-04-17 DIAGNOSIS — F17210 Nicotine dependence, cigarettes, uncomplicated: Secondary | ICD-10-CM

## 2016-04-17 DIAGNOSIS — G8929 Other chronic pain: Secondary | ICD-10-CM

## 2016-04-17 DIAGNOSIS — M25512 Pain in left shoulder: Secondary | ICD-10-CM | POA: Diagnosis not present

## 2016-04-17 MED ORDER — OXYCODONE-ACETAMINOPHEN 10-325 MG PO TABS: ORAL_TABLET | ORAL | 0 refills | Status: DC

## 2016-04-17 NOTE — Progress Notes (Signed)
PROCEDURE NOTE:  The patient request injection, verbal consent was obtained.  The left shoulder was prepped appropriately after time out was performed.   Sterile technique was observed and injection of 1 cc of Depo-Medrol 40 mg with several cc's of plain xylocaine. Anesthesia was provided by ethyl chloride and a 20-gauge needle was used to inject the shoulder area. A posterior approach was used.  The injection was tolerated well.  A band aid dressing was applied.  The patient was advised to apply ice later today and tomorrow to the injection sight as needed.  Pain medicine was given after checking the state narcotic web site.  Return in one month.  Call if any problem.  She continues to smoke.  Encounter Diagnoses  Name Primary?  . Chronic left shoulder pain Yes  . Cigarette nicotine dependence without complication    Electronically Signed Sanjuana Kava, MD 1/16/201811:36 AM

## 2016-04-17 NOTE — Patient Instructions (Signed)
Steps to Quit Smoking Smoking tobacco can be bad for your health. It can also affect almost every organ in your body. Smoking puts you and people around you at risk for many serious [name]-lasting (chronic) diseases. Quitting smoking is hard, but it is one of the best things that you can do for your health. It is never too late to quit. What are the benefits of quitting smoking? When you quit smoking, you lower your risk for getting serious diseases and conditions. They can include:  Lung cancer or lung disease.  Heart disease.  Stroke.  Heart attack.  Not being able to have children (infertility).  Weak bones (osteoporosis) and broken bones (fractures). If you have coughing, wheezing, and shortness of breath, those symptoms may get better when you quit. You may also get sick less often. If you are pregnant, quitting smoking can help to lower your chances of having a baby of low birth weight. What can I do to help me quit smoking? Talk with your doctor about what can help you quit smoking. Some things you can do (strategies) include:  Quitting smoking totally, instead of slowly cutting back how much you smoke over a period of time.  Going to in-person counseling. You are more likely to quit if you go to many counseling sessions.  Using resources and support systems, such as:  Online chats with a counselor.  Phone quitlines.  Printed self-help materials.  Support groups or group counseling.  Text messaging programs.  Mobile phone apps or applications.  Taking medicines. Some of these medicines may have nicotine in them. If you are pregnant or breastfeeding, do not take any medicines to quit smoking unless your doctor says it is okay. Talk with your doctor about counseling or other things that can help you. Talk with your doctor about using more than one strategy at the same time, such as taking medicines while you are also going to in-person counseling. This can help make quitting  easier. What things can I do to make it easier to quit? Quitting smoking might feel very hard at first, but there is a lot that you can do to make it easier. Take these steps:  Talk to your family and friends. Ask them to support and encourage you.  Call phone quitlines, reach out to support groups, or work with a counselor.  Ask people who smoke to not smoke around you.  Avoid places that make you want (trigger) to smoke, such as:  Bars.  Parties.  Smoke-break areas at work.  Spend time with people who do not smoke.  Lower the stress in your life. Stress can make you want to smoke. Try these things to help your stress:  Getting regular exercise.  Deep-breathing exercises.  Yoga.  Meditating.  Doing a body scan. To do this, close your eyes, focus on one area of your body at a time from head to toe, and notice which parts of your body are tense. Try to relax the muscles in those areas.  Download or buy apps on your mobile phone or tablet that can help you stick to your quit plan. There are many free apps, such as QuitGuide from the CDC (Centers for Disease Control and Prevention). You can find more support from smokefree.gov and other websites. This information is not intended to replace advice given to you by your health care provider. Make sure you discuss any questions you have with your health care provider. Document Released: 01/13/2009 Document Revised: 11/15/2015 Document   Reviewed: 08/03/2014 Elsevier Interactive Patient Education  2017 [employer]  

## 2016-04-25 ENCOUNTER — Telehealth: Payer: Self-pay | Admitting: Orthopaedic Surgery

## 2016-04-25 MED ORDER — OXYCODONE HCL 10 MG PO TABS: 10.0000 mg | ORAL_TABLET | ORAL | 0 refills | Status: DC | PRN

## 2016-04-25 NOTE — Telephone Encounter (Signed)
Oxycodone HCI 10 MGS  Qty 170 Tablets

## 2016-05-01 ENCOUNTER — Other Ambulatory Visit (INDEPENDENT_AMBULATORY_CARE_PROVIDER_SITE_OTHER): Payer: Self-pay | Admitting: Internal Medicine

## 2016-05-15 ENCOUNTER — Encounter: Payer: Self-pay | Admitting: Orthopaedic Surgery

## 2016-05-15 ENCOUNTER — Ambulatory Visit (INDEPENDENT_AMBULATORY_CARE_PROVIDER_SITE_OTHER): Payer: Medicare Other | Admitting: Orthopaedic Surgery

## 2016-05-15 VITALS — BP 117/75 | HR 77 | Temp 97.3°F | Ht 60.0 in | Wt 167.0 lb

## 2016-05-15 DIAGNOSIS — G8929 Other chronic pain: Secondary | ICD-10-CM

## 2016-05-15 DIAGNOSIS — M25512 Pain in left shoulder: Secondary | ICD-10-CM

## 2016-05-15 DIAGNOSIS — F17210 Nicotine dependence, cigarettes, uncomplicated: Secondary | ICD-10-CM

## 2016-05-15 MED ORDER — OXYCODONE-ACETAMINOPHEN 10-325 MG PO TABS: ORAL_TABLET | ORAL | 0 refills | Status: DC

## 2016-05-15 NOTE — Patient Instructions (Signed)
Steps to Quit Smoking Smoking tobacco can be bad for your health. It can also affect almost every organ in your body. Smoking puts you and people around you at risk for many serious long-lasting (chronic) diseases. Quitting smoking is hard, but it is one of the best things that you can do for your health. It is never too late to quit. What are the benefits of quitting smoking? When you quit smoking, you lower your risk for getting serious diseases and conditions. They can include:  Lung cancer or lung disease.  Heart disease.  Stroke.  Heart attack.  Not being able to have children (infertility).  Weak bones (osteoporosis) and broken bones (fractures). If you have coughing, wheezing, and shortness of breath, those symptoms may get better when you quit. You may also get sick less often. If you are pregnant, quitting smoking can help to lower your chances of having a baby of low birth weight. What can I do to help me quit smoking? Talk with your doctor about what can help you quit smoking. Some things you can do (strategies) include:  Quitting smoking totally, instead of slowly cutting back how much you smoke over a period of time.  Going to in-person counseling. You are more likely to quit if you go to many counseling sessions.  Using resources and support systems, such as:  Online chats with a counselor.  Phone quitlines.  Printed self-help materials.  Support groups or group counseling.  Text messaging programs.  Mobile phone apps or applications.  Taking medicines. Some of these medicines may have nicotine in them. If you are pregnant or breastfeeding, do not take any medicines to quit smoking unless your doctor says it is okay. Talk with your doctor about counseling or other things that can help you. Talk with your doctor about using more than one strategy at the same time, such as taking medicines while you are also going to in-person counseling. This can help make quitting  easier. What things can I do to make it easier to quit? Quitting smoking might feel very hard at first, but there is a lot that you can do to make it easier. Take these steps:  Talk to your family and friends. Ask them to support and encourage you.  Call phone quitlines, reach out to support groups, or work with a counselor.  Ask people who smoke to not smoke around you.  Avoid places that make you want (trigger) to smoke, such as:  Bars.  Parties.  Smoke-break areas at work.  Spend time with people who do not smoke.  Lower the stress in your life. Stress can make you want to smoke. Try these things to help your stress:  Getting regular exercise.  Deep-breathing exercises.  Yoga.  Meditating.  Doing a body scan. To do this, close your eyes, focus on one area of your body at a time from head to toe, and notice which parts of your body are tense. Try to relax the muscles in those areas.  Download or buy apps on your mobile phone or tablet that can help you stick to your quit plan. There are many free apps, such as QuitGuide from the CDC (Centers for Disease Control and Prevention). You can find more support from smokefree.gov and other websites. This information is not intended to replace advice given to you by your health care provider. Make sure you discuss any questions you have with your health care provider. Document Released: 01/13/2009 Document Revised: 11/15/2015 Document   Reviewed: 08/03/2014 Elsevier Interactive Patient Education  2017 Elsevier Inc.  

## 2016-05-15 NOTE — Progress Notes (Signed)
PROCEDURE NOTE:  The patient request injection, verbal consent was obtained.  The left shoulder was prepped appropriately after time out was performed.   Sterile technique was observed and injection of 1 cc of Depo-Medrol 40 mg with several cc's of plain xylocaine. Anesthesia was provided by ethyl chloride and a 20-gauge needle was used to inject the shoulder area. A posterior approach was used.  The injection was tolerated well.  A band aid dressing was applied.  The patient was advised to apply ice later today and tomorrow to the injection sight as needed.  I have reviewed the [place] web site prior to prescribing narcotic medicine for this patient.  Return in one month.  Electronically Signed Sanjuana Kava, MD 2/13/201810:59 AM

## 2016-05-23 ENCOUNTER — Telehealth: Payer: Self-pay | Admitting: Orthopaedic Surgery

## 2016-05-23 MED ORDER — OXYCODONE HCL 10 MG PO TABS: 10.0000 mg | ORAL_TABLET | ORAL | 0 refills | Status: DC | PRN

## 2016-05-23 NOTE — Telephone Encounter (Signed)
Patient requests refill on Hydrocodone HCI 10 mgs.   Qty  170  Sig: Take 1 tablet (10 mg total) by mouth every 4 (four) hours as needed (Must last 30 days.). pain

## 2016-06-12 ENCOUNTER — Ambulatory Visit (INDEPENDENT_AMBULATORY_CARE_PROVIDER_SITE_OTHER): Payer: Medicare Other | Admitting: Orthopaedic Surgery

## 2016-06-12 VITALS — BP 127/79 | HR 73 | Temp 97.7°F | Ht 60.0 in | Wt 158.8 lb

## 2016-06-12 DIAGNOSIS — F17210 Nicotine dependence, cigarettes, uncomplicated: Secondary | ICD-10-CM

## 2016-06-12 DIAGNOSIS — G8929 Other chronic pain: Secondary | ICD-10-CM | POA: Diagnosis not present

## 2016-06-12 DIAGNOSIS — M25512 Pain in left shoulder: Secondary | ICD-10-CM

## 2016-06-12 MED ORDER — OXYCODONE-ACETAMINOPHEN 10-325 MG PO TABS: ORAL_TABLET | ORAL | 0 refills | Status: DC

## 2016-06-12 NOTE — Progress Notes (Signed)
PROCEDURE NOTE:  The patient request injection, verbal consent was obtained.  The left shoulder was prepped appropriately after time out was performed.   Sterile technique was observed and injection of 1 cc of Depo-Medrol 40 mg with several cc's of plain xylocaine. Anesthesia was provided by ethyl chloride and a 20-gauge needle was used to inject the shoulder area. A posterior approach was used.  The injection was tolerated well.  A band aid dressing was applied.  The patient was advised to apply ice later today and tomorrow to the injection sight as needed.  She continues to smoke. She is not willing to quit.  Encounter Diagnoses  Name Primary?  . Chronic left shoulder pain Yes  . Cigarette nicotine dependence without complication    Return in one month.  I have reviewed the [place] web site prior to prescribing narcotic medicine for this patient.  Call if any problem.  Electronically Signed Sanjuana Kava, MD 3/13/201810:15 AM

## 2016-06-12 NOTE — Patient Instructions (Signed)
Smoking Tobacco Information Smoking tobacco will very likely harm your health. Tobacco contains a poisonous (toxic), colorless chemical called nicotine. Nicotine affects the brain and makes tobacco addictive. This change in your brain can make it hard to stop smoking. Tobacco also has other toxic chemicals that can hurt your body and raise your risk of many cancers. How can smoking tobacco affect me? Smoking tobacco can increase your chances of having serious health conditions, such as:  Cancer. Smoking is most commonly associated with lung cancer, but can lead to cancer in other parts of the body.  Chronic obstructive pulmonary disease (COPD). This is a long-term lung condition that makes it hard to breathe. It also gets worse over time.  High blood pressure (hypertension), heart disease, stroke, or heart attack.  Lung infections, such as pneumonia.  Cataracts. This is when the lenses in the eyes become clouded.  Digestive problems. This may include peptic ulcers, heartburn, and gastroesophageal reflux disease (GERD).  Oral health problems, such as gum disease and tooth loss.  Loss of taste and smell. Smoking can affect your appearance by causing:  Wrinkles.  Yellow or stained teeth, fingers, and fingernails. Smoking tobacco can also affect your social life.  Many workplaces, [employer], hotels, and public places are tobacco-free. This means that you may experience challenges in finding places to smoke when away from home.  The cost of a smoking habit can be expensive. Expenses for someone who smokes come in two ways:  You spend money on a regular basis to buy tobacco.  Your health care costs in the long-term are higher if you smoke.  Tobacco smoke can also affect the health of those around you. Children of smokers have greater chances of:  Sudden infant death syndrome (SIDS).  Ear infections.  Lung infections. What lifestyle changes can be made?  Do not start smoking.  Quit if you already do.  To quit smoking:  Make a plan to quit smoking and commit yourself to it. Look for programs to help you and ask your health care provider for recommendations and ideas.  Talk with your health care provider about using nicotine replacement medicines to help you quit. Medicine replacement medicines include gum, lozenges, patches, sprays, or pills.  Do not replace cigarette smoking with electronic cigarettes, which are commonly called e-cigarettes. The safety of e-cigarettes is not known, and some may contain harmful chemicals.  Avoid places, people, or situations that tempt you to smoke.  If you try to quit but return to smoking, don't give up hope. It is very common for people to try a number of times before they fully succeed. When you feel ready again, give it another try.  Quitting smoking might affect the way you eat as well as your weight. Be prepared to monitor your eating habits. Get support in planning and following a healthy diet.  Ask your health care provider about having regular tests (screenings) to check for cancer. This may include blood tests, imaging tests, and other tests.  Exercise regularly. Consider taking walks, joining a gym, or doing yoga or exercise classes.  Develop skills to manage your stress. These skills include meditation. What are the benefits of quitting smoking? By quitting smoking, you may:  Lower your risk of getting cancer and other diseases caused by smoking.  Live longer.  Breathe better.  Lower your blood pressure and heart rate.  Stop your addiction to tobacco.  Stop creating secondhand smoke that hurts other people.  Improve your sense of taste  and smell.  Look better over time, due to having fewer wrinkles and less staining. What can happen if changes are not made? If you do not stop smoking, you may:  Get cancer and other diseases.  Develop COPD or other long-term (chronic) lung conditions.  Develop  serious problems with your heart and blood vessels (cardiovascular system).  Need more tests to screen for problems caused by smoking.  Have higher, long-term healthcare costs from medicines or treatments related to smoking.  Continue to have worsening changes in your lungs, mouth, and nose. Where to find support: To get support to quit smoking, consider:  Asking your health care provider for more information and resources.  Taking classes to learn more about quitting smoking.  Looking for local organizations that offer resources about quitting smoking.  Joining a support group for people who want to quit smoking in your local community. Where to find more information: You may find more information about quitting smoking from:  HelpGuide.org: www.helpguide.org/articles/addictions/how-to-quit-smoking.htm  https://hall.com/: smokefree.gov  American Lung Association: www.lung.org Contact a health care provider if:  You have problems breathing.  Your lips, nose, or fingers turn blue.  You have chest pain.  You are coughing up blood.  You feel faint or you pass out.  You have other noticeable changes that cause you to worry. Summary  Smoking tobacco can negatively affect your health, the health of those around you, your finances, and your social life.  Do not start smoking. Quit if you already do. If you need help quitting, ask your health care provider.  Think about joining a support group for people who want to quit smoking in your local community. There are many effective programs that will help you to quit this behavior. This information is not intended to replace advice given to you by your health care provider. Make sure you discuss any questions you have with your health care provider. Document Released: 04/03/2016 Document Revised: 04/03/2016 Document Reviewed: 04/03/2016 Elsevier Interactive Patient Education  2017 [employer].

## 2016-06-26 ENCOUNTER — Other Ambulatory Visit (INDEPENDENT_AMBULATORY_CARE_PROVIDER_SITE_OTHER): Payer: Self-pay | Admitting: Internal Medicine

## 2016-06-26 ENCOUNTER — Encounter (INDEPENDENT_AMBULATORY_CARE_PROVIDER_SITE_OTHER): Payer: Self-pay | Admitting: *Deleted

## 2016-06-26 ENCOUNTER — Ambulatory Visit (INDEPENDENT_AMBULATORY_CARE_PROVIDER_SITE_OTHER): Payer: Medicare Other | Admitting: Internal Medicine

## 2016-06-26 ENCOUNTER — Encounter (INDEPENDENT_AMBULATORY_CARE_PROVIDER_SITE_OTHER): Payer: Self-pay | Admitting: Internal Medicine

## 2016-06-26 ENCOUNTER — Telehealth: Payer: Self-pay | Admitting: Orthopaedic Surgery

## 2016-06-26 VITALS — BP 120/82 | HR 80 | Temp 98.1°F | Resp 18 | Ht 60.0 in | Wt 160.3 lb

## 2016-06-26 DIAGNOSIS — R112 Nausea with vomiting, unspecified: Secondary | ICD-10-CM | POA: Diagnosis not present

## 2016-06-26 DIAGNOSIS — R1903 Right lower quadrant abdominal swelling, mass and lump: Secondary | ICD-10-CM

## 2016-06-26 DIAGNOSIS — R634 Abnormal weight loss: Secondary | ICD-10-CM | POA: Diagnosis not present

## 2016-06-26 DIAGNOSIS — R1900 Intra-abdominal and pelvic swelling, mass and lump, unspecified site: Secondary | ICD-10-CM

## 2016-06-26 DIAGNOSIS — R109 Unspecified abdominal pain: Secondary | ICD-10-CM

## 2016-06-26 MED ORDER — OXYCODONE HCL 10 MG PO TABS: 10.0000 mg | ORAL_TABLET | ORAL | 0 refills | Status: DC | PRN

## 2016-06-26 NOTE — Progress Notes (Signed)
Presenting complaint;  Abdominal pain nausea vomiting and weight loss.  Subjective:  [name] is 56 year old Caucasian female who is here for scheduled visit she was last seen for similar symptomatology on 12/13/2015. She underwent esophagogastroduodenoscopy. 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 She returned for solid-phase gastric emptying study which was normal. She was not able to come for follow-up visit in 8 weeks. Patient does not feel well. She states she had flu twice last month and she was seen in emergency room. He remains with nausea every day and she vomits 3-4 times a week. Vomitus consists of food and fluid. She continues to complain of mid and lower abdominal pain. Pain is more or less constant. At times is worse. This pain is not relieved with voiding or defecation. He was given prescription for sucralfate but she says every time she took this medication it caused nausea and vomiting and she therefore stopped it. She has poor appetite. She has lost 23 pounds since her last visit of August 2018. She lives at home with her sister. She says her sister is disabled. 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 She eats 1 meal a day. Rest of the time she eats snacks. He says only thing that is better is that she is not having heartburn anymore. She says she is taking 40 mg of Lasix every day and not 3 times a day. She has not taken sucralfate in several weeks. She denies fever chills or night sweats.   Current Medications: Outpatient Encounter Prescriptions as of 06/26/2016  Medication Sig  . beta carotene w/minerals (OCUVITE) tablet Take 1 tablet by mouth daily.    Marland Kitchen esomeprazole (NEXIUM) 40 MG capsule Take 1 capsule (40 mg total) by mouth daily before breakfast.  . fentaNYL (DURAGESIC - DOSED MCG/HR) 100 MCG/HR Place 1 patch onto the skin every 3 (three) days.    . furosemide (LASIX) 40 MG tablet  Take 40 mg by mouth 3 (three) times daily.    Marland Kitchen levothyroxine (SYNTHROID, LEVOTHROID) 137 MCG tablet Take 137 mcg by mouth daily before breakfast.  . LORazepam (ATIVAN) 0.5 MG tablet Take 0.5 mg by mouth every 8 (eight) hours.  . metoprolol tartrate (LOPRESSOR) 25 MG tablet Take 25 mg by mouth 2 (two) times daily.   . Multiple Vitamins-Iron (MULTIVITAMINS WITH IRON) TABS Take 1 tablet by mouth daily.  . Oxycodone HCl 10 MG TABS Take 1 tablet (10 mg total) by mouth every 4 (four) hours as needed (Must last 30 days.). pain  . oxyCODONE-acetaminophen (PERCOCET) 10-325 MG tablet One every four hours as needed for pain.  Must last 30 days.  . polyethylene glycol (MIRALAX / GLYCOLAX) packet Take 17 g by mouth 2 (two) times daily before a meal.    . pregabalin (LYRICA) 100 MG capsule Take 100 mg by mouth 3 (three) times daily.    . sucralfate (CARAFATE) 1 g tablet TAKE 1 TABLET BY MOUTH FOUR TIMES DAILY WITH MEALS AND AT BEDTIME  . tiZANidine (ZANAFLEX) 4 MG capsule Take 4 mg by mouth every 6 (six) hours.   . topiramate (TOPAMAX) 100 MG tablet Take 100 mg by mouth 2 (two) times daily.    . traMADol (ULTRAM) 50 MG tablet Take 50 mg by mouth at bedtime.   . traZODone (DESYREL) 50 MG tablet Take 50 mg by mouth at bedtime.  . VENTOLIN HFA 108 (90 Base) MCG/ACT inhaler   .  zolpidem (AMBIEN) 10 MG tablet Take 10 mg by mouth at bedtime.   . [DISCONTINUED] Milnacipran (SAVELLA) 50 MG TABS tablet Take 50 mg by mouth 2 (two) times daily.   No facility-administered encounter medications on file as of 06/26/2016.      Objective: Blood pressure 120/82, pulse 80, temperature 98.1 F (36.7 C), temperature source Oral, resp. rate 18, height 5' (1.524 m), weight 160 lb 4.8 oz (72.7 kg). Patient is alert. She appears chronically ill. Her posture is abnormal. She is stooping forward. Conjunctiva is pink. Sclera is nonicteric Oropharyngeal mucosa is normal. No neck masses or thyromegaly noted. Cardiac exam with  regular rhythm normal S1 and S2. No murmur or gallop noted. Lungs are clear to auscultation. Abdomen is full. Bowel sounds are normal. On palpation abdomen is soft with mild generalized tenderness. She has firm mass below the level of umbilicus mainly to the right of midline. It is mildly tender. Abdomen was reexamined after she voided and there was no change in this mass. Trace edema around ankles.   Assessment:  #1. Chronic nausea and vomiting. She was felt to have gastroparesis but gastric emptying studies normal. Similarly recent EGD was negative for peptic ulcer disease or pyloric stenosis. Nausea and vomiting may be secondary to her medication or lower abdominal mass which is evident on today's exam. #2. Weight loss secondary to diminished oral intake and ongoing nausea and vomiting. #3. Lower abdominal mass. This is new finding. This may explain most of her symptoms if not all.   Plan:  Abdominopelvic CT with contrast ASAP. Other recommendations to follow.

## 2016-06-26 NOTE — Telephone Encounter (Signed)
Oxycodone HCI 10 MG  Qty 170 tablets

## 2016-06-26 NOTE — Patient Instructions (Signed)
Physician will call with results of CT when completed. 

## 2016-06-27 ENCOUNTER — Ambulatory Visit (HOSPITAL_COMMUNITY)
Admission: RE | Admit: 2016-06-27 | Discharge: 2016-06-27 | Disposition: A | Discharge status: Home / Self Care | Payer: Medicare Other | Source: Ambulatory Visit | Attending: Internal Medicine | Admitting: Internal Medicine

## 2016-06-27 DIAGNOSIS — D3501 Benign neoplasm of right adrenal gland: Secondary | ICD-10-CM | POA: Diagnosis not present

## 2016-06-27 DIAGNOSIS — N200 Calculus of kidney: Secondary | ICD-10-CM | POA: Insufficient documentation

## 2016-06-27 DIAGNOSIS — R1900 Intra-abdominal and pelvic swelling, mass and lump, unspecified site: Secondary | ICD-10-CM | POA: Diagnosis present

## 2016-06-27 DIAGNOSIS — R109 Unspecified abdominal pain: Secondary | ICD-10-CM | POA: Diagnosis present

## 2016-06-27 DIAGNOSIS — R112 Nausea with vomiting, unspecified: Secondary | ICD-10-CM | POA: Insufficient documentation

## 2016-06-27 DIAGNOSIS — R634 Abnormal weight loss: Secondary | ICD-10-CM | POA: Insufficient documentation

## 2016-06-27 DIAGNOSIS — D3502 Benign neoplasm of left adrenal gland: Secondary | ICD-10-CM | POA: Insufficient documentation

## 2016-06-27 MED ORDER — IOPAMIDOL (ISOVUE-300) INJECTION 61%
100.0000 mL | Freq: Once | INTRAVENOUS | Status: AC | PRN
  Administered 2016-06-27: 100 mL via INTRAVENOUS

## 2016-07-02 ENCOUNTER — Other Ambulatory Visit (INDEPENDENT_AMBULATORY_CARE_PROVIDER_SITE_OTHER): Payer: Self-pay | Admitting: Internal Medicine

## 2016-07-10 ENCOUNTER — Ambulatory Visit: Payer: Medicare Other | Admitting: Orthopaedic Surgery

## 2016-07-11 ENCOUNTER — Encounter: Payer: Self-pay | Admitting: Orthopaedic Surgery

## 2016-07-11 ENCOUNTER — Ambulatory Visit (INDEPENDENT_AMBULATORY_CARE_PROVIDER_SITE_OTHER): Payer: Medicare Other | Admitting: Orthopaedic Surgery

## 2016-07-11 VITALS — BP 111/75 | HR 74 | Ht 60.0 in | Wt 161.0 lb

## 2016-07-11 DIAGNOSIS — F17210 Nicotine dependence, cigarettes, uncomplicated: Secondary | ICD-10-CM

## 2016-07-11 DIAGNOSIS — M25512 Pain in left shoulder: Secondary | ICD-10-CM

## 2016-07-11 DIAGNOSIS — G8929 Other chronic pain: Secondary | ICD-10-CM

## 2016-07-11 MED ORDER — OXYCODONE-ACETAMINOPHEN 10-325 MG PO TABS: ORAL_TABLET | ORAL | 0 refills | Status: DC

## 2016-07-11 NOTE — Patient Instructions (Signed)
Steps to Quit Smoking Smoking tobacco can be bad for your health. It can also affect almost every organ in your body. Smoking puts you and people around you at risk for many serious long-lasting (chronic) diseases. Quitting smoking is hard, but it is one of the best things that you can do for your health. It is never too late to quit. What are the benefits of quitting smoking? When you quit smoking, you lower your risk for getting serious diseases and conditions. They can include:  Lung cancer or lung disease.  Heart disease.  Stroke.  Heart attack.  Not being able to have children (infertility).  Weak bones (osteoporosis) and broken bones (fractures). If you have coughing, wheezing, and shortness of breath, those symptoms may get better when you quit. You may also get sick less often. If you are pregnant, quitting smoking can help to lower your chances of having a baby of low birth weight. What can I do to help me quit smoking? Talk with your doctor about what can help you quit smoking. Some things you can do (strategies) include:  Quitting smoking totally, instead of slowly cutting back how much you smoke over a period of time.  Going to in-person counseling. You are more likely to quit if you go to many counseling sessions.  Using resources and support systems, such as:  Online chats with a counselor.  Phone quitlines.  Printed self-help materials.  Support groups or group counseling.  Text messaging programs.  Mobile phone apps or applications.  Taking medicines. Some of these medicines may have nicotine in them. If you are pregnant or breastfeeding, do not take any medicines to quit smoking unless your doctor says it is okay. Talk with your doctor about counseling or other things that can help you. Talk with your doctor about using more than one strategy at the same time, such as taking medicines while you are also going to in-person counseling. This can help make quitting  easier. What things can I do to make it easier to quit? Quitting smoking might feel very hard at first, but there is a lot that you can do to make it easier. Take these steps:  Talk to your family and friends. Ask them to support and encourage you.  Call phone quitlines, reach out to support groups, or work with a counselor.  Ask people who smoke to not smoke around you.  Avoid places that make you want (trigger) to smoke, such as:  Bars.  Parties.  Smoke-break areas at work.  Spend time with people who do not smoke.  Lower the stress in your life. Stress can make you want to smoke. Try these things to help your stress:  Getting regular exercise.  Deep-breathing exercises.  Yoga.  Meditating.  Doing a body scan. To do this, close your eyes, focus on one area of your body at a time from head to toe, and notice which parts of your body are tense. Try to relax the muscles in those areas.  Download or buy apps on your mobile phone or tablet that can help you stick to your quit plan. There are many free apps, such as QuitGuide from the CDC (Centers for Disease Control and Prevention). You can find more support from smokefree.gov and other websites. This information is not intended to replace advice given to you by your health care provider. Make sure you discuss any questions you have with your health care provider. Document Released: 01/13/2009 Document Revised: 11/15/2015 Document   Reviewed: 08/03/2014 Elsevier Interactive Patient Education  2017 Elsevier Inc.  

## 2016-07-11 NOTE — Progress Notes (Signed)
PROCEDURE NOTE:  The patient request injection, verbal consent was obtained.  The left shoulder was prepped appropriately after time out was performed.   Sterile technique was observed and injection of 1 cc of Depo-Medrol 40 mg with several cc's of plain xylocaine. Anesthesia was provided by ethyl chloride and a 20-gauge needle was used to inject the shoulder area. A posterior approach was used.  The injection was tolerated well.  A band aid dressing was applied.  The patient was advised to apply ice later today and tomorrow to the injection sight as needed.  She is still smoking and trying to cut back.  Encounter Diagnoses  Name Primary?  . Chronic left shoulder pain Yes  . Cigarette nicotine dependence without complication    Return in one month.  Call if any problem  Precautions discussed.  I have reviewed the [place] web site prior to prescribing narcotic medicine for this patient.  Electronically Signed Sanjuana Kava, MD 4/11/201810:20 AM

## 2016-07-17 ENCOUNTER — Encounter (INDEPENDENT_AMBULATORY_CARE_PROVIDER_SITE_OTHER): Payer: Self-pay

## 2016-07-17 ENCOUNTER — Encounter (INDEPENDENT_AMBULATORY_CARE_PROVIDER_SITE_OTHER): Payer: Self-pay | Admitting: Internal Medicine

## 2016-07-23 ENCOUNTER — Telehealth: Payer: Self-pay | Admitting: Orthopedic Surgery

## 2016-07-23 MED ORDER — OXYCODONE-ACETAMINOPHEN 10-325 MG PO TABS: ORAL_TABLET | ORAL | 0 refills | Status: DC

## 2016-07-23 NOTE — Telephone Encounter (Signed)
Patient called to request refill:  oxyCODONE-acetaminophen (PERCOCET) 10-325 MG tablet 180 tablet

## 2016-07-31 ENCOUNTER — Ambulatory Visit (INDEPENDENT_AMBULATORY_CARE_PROVIDER_SITE_OTHER): Payer: Medicare Other | Admitting: Internal Medicine

## 2016-07-31 ENCOUNTER — Encounter (INDEPENDENT_AMBULATORY_CARE_PROVIDER_SITE_OTHER): Payer: Self-pay | Admitting: Internal Medicine

## 2016-07-31 VITALS — BP 122/80 | HR 78 | Temp 97.5°F | Resp 18 | Ht 60.0 in | Wt 161.4 lb

## 2016-07-31 DIAGNOSIS — R112 Nausea with vomiting, unspecified: Secondary | ICD-10-CM

## 2016-07-31 DIAGNOSIS — R1903 Right lower quadrant abdominal swelling, mass and lump: Secondary | ICD-10-CM | POA: Diagnosis not present

## 2016-07-31 DIAGNOSIS — R634 Abnormal weight loss: Secondary | ICD-10-CM | POA: Diagnosis not present

## 2016-07-31 NOTE — Progress Notes (Signed)
Presenting complaint;  Follow-up for chronic abdominal pain nausea and vomiting.  Database and Subjective:  Patient is 56 year old Caucasian female with multiple medical problems was in chronic poor health was evaluated last year for abdominal pain nausea vomiting and hematemesis. She underwent EGD on 12/23/2015 which revealed portal hypertensive gastropathy and prepyloric intestinal metaplasia. She did not have pyloric stenosis of peptic ulcer disease. She will return for follow-up visit on 06/15/2016. Abdominal examination was pertinent for vague mass in the right mid lower abdomen. Abdominopelvic CT was obtained and there was no mass. She returned for small bowel follow-through which revealed very slow transit. There appear to be a clump of small bowel in the right low mid abdomen. She returned next day and contrast was in her colon.  She does not feel any better. She has actually gained 1 pound since her last visit. She has nausea and vomiting at least 3-4 times a week. She generally vomits fluid bile and sometimes food debris. She states she is on Lasix 3 times a day but she rarely takes more than once a day. She's also concerned about dry skin and cracks involving both hands. She was seen by Dr. [place] Crane of dermatology few years ago but therapy did not help. She has an average of 2 bowel movements per day. Every now and then she has diarrhea. She also gives history of intermittent black stools but denies rectal bleeding. She says she is not able to cocaine eats meals which are microwavable. She has tried supplements with unable to tolerate them.  Current Medications: Outpatient Encounter Prescriptions as of 07/31/2016  Medication Sig  . beta carotene w/minerals (OCUVITE) tablet Take 1 tablet by mouth daily.    Marland Kitchen esomeprazole (NEXIUM) 40 MG capsule Take 1 capsule (40 mg total) by mouth daily before breakfast.  . fentaNYL (DURAGESIC - DOSED MCG/HR) 100 MCG/HR Place 1 patch onto the skin every 3  (three) days.    . furosemide (LASIX) 40 MG tablet Take 40 mg by mouth 3 (three) times daily.    Marland Kitchen levothyroxine (SYNTHROID, LEVOTHROID) 137 MCG tablet Take 137 mcg by mouth daily before breakfast.  . LORazepam (ATIVAN) 0.5 MG tablet Take 0.5 mg by mouth every 8 (eight) hours.  . metoprolol tartrate (LOPRESSOR) 25 MG tablet Take 25 mg by mouth 2 (two) times daily.   . Multiple Vitamins-Iron (MULTIVITAMINS WITH IRON) TABS Take 1 tablet by mouth daily.  . Oxycodone HCl 10 MG TABS Take 1 tablet (10 mg total) by mouth every 4 (four) hours as needed (Must last 30 days.). pain  . oxyCODONE-acetaminophen (PERCOCET) 10-325 MG tablet One every four hours as needed for pain.  Must last 30 days.  . polyethylene glycol (MIRALAX / GLYCOLAX) packet Take 17 g by mouth 2 (two) times daily before a meal.    . pregabalin (LYRICA) 100 MG capsule Take 100 mg by mouth 3 (three) times daily.    . sucralfate (CARAFATE) 1 g tablet TAKE 1 TABLET BY MOUTH FOUR TIMES DAILY WITH MEALS AND AT BEDTIME  . tiZANidine (ZANAFLEX) 4 MG capsule Take 4 mg by mouth every 6 (six) hours.   . topiramate (TOPAMAX) 100 MG tablet Take 100 mg by mouth 2 (two) times daily.    . traMADol (ULTRAM) 50 MG tablet Take 50 mg by mouth at bedtime.   . traZODone (DESYREL) 50 MG tablet Take 50 mg by mouth at bedtime.  . VENTOLIN HFA 108 (90 Base) MCG/ACT inhaler Inhale 1-2 puffs into the lungs as  needed.   . zolpidem (AMBIEN) 10 MG tablet Take 10 mg by mouth at bedtime.    No facility-administered encounter medications on file as of 07/31/2016.      Objective: Blood pressure 122/80, pulse 78, temperature 97.5 F (36.4 C), temperature source Oral, resp. rate 18, height 5' (1.524 m), weight 161 lb 6.4 oz (73.2 kg). Patient is alert and in no acute distress. She has abnormal posterior with stooping. Conjunctiva is pink. Sclera is nonicteric Oropharyngeal mucosa is normal. No neck masses or thyromegaly noted. Cardiac exam with regular rhythm  normal S1 and S2. No murmur or gallop noted. Lungs are clear to auscultation. Abdomen is full. Bowel sounds are normal. On palpation abdomen is soft. Mass in right midabdomen is still palpable and is mildly tender. No organomegaly. Trace edema around ankles. Skin in both hands is very dry with superficial cracks.  Labs/studies Results:   CT and small bowel study results as above.  Assessment:  #1. 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 EGD in September last year was unremarkable and no significant abnormality noted on abdominopelvic CT and small bowel study. I'm afraid her medications are also contribution to her symptoms. #2. Pseudomass at right /lower abdomen most likely due to small bowel matted together but no evidence of proximal dilation. #3. Weight loss. Not lost any weight since her last visit 5 weeks ago. Weight loss appears to be due to diminished calorie intake.   Plan:  Patient encouraged to eat frequent small meals. Office visit in 4 months until symptoms change. She will check with Dr. Sherrie Sport skin rash involving both hands.

## 2016-07-31 NOTE — Patient Instructions (Addendum)
Eat frequent small meals

## 2016-08-06 ENCOUNTER — Telehealth: Payer: Self-pay | Admitting: Orthopaedic Surgery

## 2016-08-06 NOTE — Telephone Encounter (Addendum)
Patient requests refill on Oxycodone 10 mgs. Percocet Qty 170  Sig: Take 1 tablet (10 mg total) by mouth every 4 (four) hours as needed (Must last 30 days.). Pain   Patient called back and stated the manufacturers are not making anymore Oxycodone 10 mgs. at this time per her pharmacist.  She said she called all over town and was told the same.    Instead of the Oxycodone could you write a prescription for Hydrocodone 10 mgs or something comparable for her?  Thanks

## 2016-08-07 NOTE — Telephone Encounter (Signed)
She got medicine on 08-02-16 from Dr. Scotty Court for this for a week.

## 2016-08-08 ENCOUNTER — Encounter: Payer: Self-pay | Admitting: Orthopaedic Surgery

## 2016-08-08 ENCOUNTER — Ambulatory Visit (INDEPENDENT_AMBULATORY_CARE_PROVIDER_SITE_OTHER): Payer: Medicare Other | Admitting: Orthopaedic Surgery

## 2016-08-08 VITALS — BP 102/64 | HR 77 | Wt 161.0 lb

## 2016-08-08 DIAGNOSIS — G8929 Other chronic pain: Secondary | ICD-10-CM

## 2016-08-08 DIAGNOSIS — M25512 Pain in left shoulder: Secondary | ICD-10-CM

## 2016-08-08 DIAGNOSIS — F17210 Nicotine dependence, cigarettes, uncomplicated: Secondary | ICD-10-CM | POA: Diagnosis not present

## 2016-08-08 DIAGNOSIS — M25511 Pain in right shoulder: Secondary | ICD-10-CM | POA: Diagnosis not present

## 2016-08-08 MED ORDER — HYDROCODONE-ACETAMINOPHEN 10-325 MG PO TABS: 1.0000 | ORAL_TABLET | ORAL | 0 refills | Status: DC | PRN

## 2016-08-08 NOTE — Progress Notes (Signed)
PROCEDURE NOTE:  The patient request injection, verbal consent was obtained.  The left shoulder was prepped appropriately after time out was performed.   Sterile technique was observed and injection of 1 cc of Depo-Medrol 40 mg with several cc's of plain xylocaine. Anesthesia was provided by ethyl chloride and a 20-gauge needle was used to inject the shoulder area. A posterior approach was used.  The injection was tolerated well.  A band aid dressing was applied.  The patient was advised to apply ice later today and tomorrow to the injection sight as needed.  PROCEDURE NOTE:  The patient request injection, verbal consent was obtained.  The right shoulder was prepped appropriately after time out was performed.   Sterile technique was observed and injection of 1 cc of Depo-Medrol 40 mg with several cc's of plain xylocaine. Anesthesia was provided by ethyl chloride and a 20-gauge needle was used to inject the shoulder area. A posterior approach was used.  The injection was tolerated well.  A band aid dressing was applied.  The patient was advised to apply ice later today and tomorrow to the injection sight as needed.  Return in one month.  I have reviewed the [facility] web site prior to prescribing narcotic medicine for this patient.  Encounter Diagnoses  Name Primary?  . Chronic left shoulder pain Yes  . Cigarette nicotine dependence without complication   . Chronic right shoulder pain     She continues to smoke  Electronically Signed Sanjuana Kava, MD 5/9/20182:58 PM

## 2016-08-09 ENCOUNTER — Ambulatory Visit: Payer: Medicare Other | Admitting: Orthopaedic Surgery

## 2016-08-24 ENCOUNTER — Other Ambulatory Visit (INDEPENDENT_AMBULATORY_CARE_PROVIDER_SITE_OTHER): Payer: Self-pay | Admitting: Internal Medicine

## 2016-09-04 ENCOUNTER — Ambulatory Visit (INDEPENDENT_AMBULATORY_CARE_PROVIDER_SITE_OTHER): Payer: Medicare Other

## 2016-09-04 ENCOUNTER — Ambulatory Visit (INDEPENDENT_AMBULATORY_CARE_PROVIDER_SITE_OTHER): Payer: Medicare Other | Admitting: Orthopaedic Surgery

## 2016-09-04 VITALS — BP 116/74 | HR 84 | Temp 97.5°F | Ht 60.0 in | Wt 160.0 lb

## 2016-09-04 DIAGNOSIS — G8929 Other chronic pain: Secondary | ICD-10-CM | POA: Diagnosis not present

## 2016-09-04 DIAGNOSIS — M25511 Pain in right shoulder: Secondary | ICD-10-CM

## 2016-09-04 DIAGNOSIS — M25512 Pain in left shoulder: Secondary | ICD-10-CM | POA: Diagnosis not present

## 2016-09-04 DIAGNOSIS — M25551 Pain in right hip: Secondary | ICD-10-CM | POA: Diagnosis not present

## 2016-09-04 DIAGNOSIS — F17210 Nicotine dependence, cigarettes, uncomplicated: Secondary | ICD-10-CM

## 2016-09-04 MED ORDER — OXYCODONE HCL 10 MG PO TABS: 10.0000 mg | ORAL_TABLET | ORAL | 0 refills | Status: DC | PRN

## 2016-09-04 MED ORDER — OXYCODONE-ACETAMINOPHEN 10-325 MG PO TABS: ORAL_TABLET | ORAL | 0 refills | Status: DC

## 2016-09-04 NOTE — Progress Notes (Signed)
Patient [id number] [name], female DOB:01/08/1961, 56 y.o. BLT:[id number]  Chief Complaint  Patient presents with  . Follow-up    Chronic left shoulder pain    HPI  [name] is a 56 y.o. female who has chronic pain in the shoulders is complaining of pain in the right hip and right pelvic brim today.  She has no trauma.  She uses a walker.  She is on high doses of narcotics but is being monitored by me and Dr. Scotty Court.  She has no numbness.  Her shoulders are better today. HPI  Body mass index is 31.25 kg/m.  ROS  Review of Systems  Constitutional:       Patient does not have Diabetes Mellitus. Patient has hypertension. Patient has COPD or shortness of breath. Patient has BMI > 35. Patient has current smoking history.  HENT: Negative for congestion.   Respiratory: Positive for cough and shortness of breath.        Sleep apnea  Cardiovascular: Negative for chest pain.  Gastrointestinal:       GERD  Endocrine: Positive for cold intolerance.  Musculoskeletal: Positive for arthralgias and back pain.  Allergic/Immunologic: Positive for environmental allergies.  Neurological: Positive for headaches.    Past Medical History:  Diagnosis Date  . Back pain   . Depression   . GERD (gastroesophageal reflux disease)   . Hypertension    diet control  . Hypothyroid   . IBS (irritable bowel syndrome)   . Migraines   . Obesity   . Osteoarthritis   . Sleep apnea     Past Surgical History:  Procedure Laterality Date  . AMPUTATION  12/13/2011   Procedure: AMPUTATION DIGIT;  Surgeon: Marcheta Grammes, DPM;  Location: [facility];  Service: Orthopedics;  Laterality: Right;  amputation second toe right foot  . BACK SURGERY    . BIOPSY  12/23/2015   Procedure: BIOPSY;  Surgeon: Rogene Houston, MD;  Location: [facility];  Service: Endoscopy;;  pre pyloric patches  . BREAST BIOPSY    . CHOLECYSTECTOMY    . ESOPHAGOGASTRODUODENOSCOPY  05/28/2011   Procedure:  ESOPHAGOGASTRODUODENOSCOPY (EGD);  Surgeon: Rogene Houston, MD;  Location: [facility];  Service: Endoscopy;  Laterality: N/A;  730  . ESOPHAGOGASTRODUODENOSCOPY N/A 10/09/2013   Procedure: ESOPHAGOGASTRODUODENOSCOPY (EGD);  Surgeon: Rogene Houston, MD;  Location: [facility];  Service: Endoscopy;  Laterality: N/A;  730  . ESOPHAGOGASTRODUODENOSCOPY (EGD) WITH PROPOFOL N/A 12/23/2015   Procedure: ESOPHAGOGASTRODUODENOSCOPY (EGD) WITH PROPOFOL;  Surgeon: Rogene Houston, MD;  Location: [facility];  Service: Endoscopy;  Laterality: N/A;  . HERNIA REPAIR    . MALONEY DILATION N/A 10/09/2013   Procedure: MALONEY DILATION;  Surgeon: Rogene Houston, MD;  Location: [facility];  Service: Endoscopy;  Laterality: N/A;  . NASAL SEPTUM SURGERY    . NECK SURGERY     2 yrs ago  . TOTAL ABDOMINAL HYSTERECTOMY     Left oophroectomy for a lare tumor about 21 yrs. RT ovary removed time of hysterectomy    No family history on file.  Social History Social History  Substance Use Topics  . Smoking status: Current Every Day Smoker    Packs/day: 0.50    Years: 35.00  . Smokeless tobacco: Never Used     Comment: 1 pack a day since age18  . Alcohol use No    Allergies  Allergen Reactions  . Cephalexin Hives    unknown  . Latex Rash  .  Betadine [Povidone Iodine] Rash  . Cleocin [Clindamycin Hcl] Hives  . Sulfa Antibiotics     Vomiting     Current Outpatient Prescriptions  Medication Sig Dispense Refill  . beta carotene w/minerals (OCUVITE) tablet Take 1 tablet by mouth daily.      Marland Kitchen esomeprazole (NEXIUM) 40 MG capsule Take 1 capsule (40 mg total) by mouth daily before breakfast.    . fentaNYL (DURAGESIC - DOSED MCG/HR) 100 MCG/HR Place 1 patch onto the skin every 3 (three) days.      . furosemide (LASIX) 40 MG tablet Take 40 mg by mouth 3 (three) times daily.      Marland Kitchen HYDROcodone-acetaminophen (NORCO) 10-325 MG tablet Take 1 tablet by mouth every 4 (four) hours as needed. 90 tablet 0  .  levothyroxine (SYNTHROID, LEVOTHROID) 137 MCG tablet Take 137 mcg by mouth daily before breakfast.    . LORazepam (ATIVAN) 0.5 MG tablet Take 0.5 mg by mouth every 8 (eight) hours.    . metoprolol tartrate (LOPRESSOR) 25 MG tablet Take 25 mg by mouth 2 (two) times daily.     . Multiple Vitamins-Iron (MULTIVITAMINS WITH IRON) TABS Take 1 tablet by mouth daily.    . Oxycodone HCl 10 MG TABS Take 1 tablet (10 mg total) by mouth every 4 (four) hours as needed (Must last 30 days.). pain 170 tablet 0  . oxyCODONE-acetaminophen (PERCOCET) 10-325 MG tablet One every four hours as needed for pain.  Must last 30 days. 180 tablet 0  . polyethylene glycol (MIRALAX / GLYCOLAX) packet Take 17 g by mouth 2 (two) times daily before a meal.      . pregabalin (LYRICA) 100 MG capsule Take 100 mg by mouth 3 (three) times daily.      . sucralfate (CARAFATE) 1 g tablet TAKE 1 TABLET BY MOUTH FOUR TIMES DAILY WITH MEALS AND AT BEDTIME 120 tablet 2  . tiZANidine (ZANAFLEX) 4 MG capsule Take 4 mg by mouth every 6 (six) hours.     . topiramate (TOPAMAX) 100 MG tablet Take 100 mg by mouth 2 (two) times daily.      . traMADol (ULTRAM) 50 MG tablet Take 50 mg by mouth at bedtime.     . traZODone (DESYREL) 50 MG tablet Take 50 mg by mouth at bedtime.    . VENTOLIN HFA 108 (90 Base) MCG/ACT inhaler Inhale 1-2 puffs into the lungs as needed.     . zolpidem (AMBIEN) 10 MG tablet Take 10 mg by mouth at bedtime.      No current facility-administered medications for this visit.      Physical Exam  Blood pressure 116/74, pulse 84, temperature 97.5 F (36.4 C), height 5' (1.524 m), weight 160 lb (72.6 kg).  Constitutional: overall normal hygiene, normal nutrition, well developed, normal grooming, normal body habitus. Assistive device:walker  Musculoskeletal: gait and station Limp right, muscle tone and strength are normal, no tremors or atrophy is present.  .  Neurological: coordination overall normal.  Deep tendon  reflex/nerve stretch intact.  Sensation normal.  Cranial nerves II-XII intact.   Skin:   Normal overall no scars, lesions, ulcers or rashes. No psoriasis.  Psychiatric: Alert and oriented x 3.  Recent memory intact, remote memory unclear.  Normal mood and affect. Well groomed.  Good eye contact.  Cardiovascular: overall no swelling, no varicosities, no edema bilaterally, normal temperatures of the legs and arms, no clubbing, cyanosis and good capillary refill.  Lymphatic: palpation is normal.  Right hip is tender.  She has limp to the right.  ROM of the right hip is full.  She walks tilted to the right which makes right hip more prominent when walking.  She has no redness.  Both shoulders are tender with both shoulders forward motion 160, internal 25 external 30, adduction full, abduction 100 and extension 10.  NV intact bilaterally.  The patient has been educated about the nature of the problem(s) and counseled on treatment options.  The patient appeared to understand what I have discussed and is in agreement with it.  Encounter Diagnoses  Name Primary?  . Right hip pain Yes  . Chronic left shoulder pain   . Cigarette nicotine dependence without complication   . Chronic right shoulder pain   . Morbid obesity due to excess calories [facility])     PLAN Call if any problems.  Precautions discussed.  Continue current medications.   Return to clinic 1 month   I have reviewed the [facility] web site prior to prescribing narcotic medicine for this patient.  She is receiving high doses of pain medicine.  She is followed by me and Dr. Scotty Court and I have been in communication with [facility] as well.  Electronically Signed Sanjuana Kava, MD 6/5/201810:35 AM

## 2016-09-06 ENCOUNTER — Ambulatory Visit: Payer: Medicare Other | Admitting: Orthopaedic Surgery

## 2016-09-26 ENCOUNTER — Telehealth: Payer: Self-pay | Admitting: Orthopaedic Surgery

## 2016-09-26 NOTE — Telephone Encounter (Signed)
Oxycodone HCI 10 MG  Qty  170 Tablets

## 2016-09-26 NOTE — Telephone Encounter (Signed)
Oxycodone-Acetaminophen  10/325 MG  Qty 180 Tablets

## 2016-09-27 ENCOUNTER — Ambulatory Visit (INDEPENDENT_AMBULATORY_CARE_PROVIDER_SITE_OTHER): Payer: Medicare Other

## 2016-09-27 ENCOUNTER — Ambulatory Visit (INDEPENDENT_AMBULATORY_CARE_PROVIDER_SITE_OTHER): Payer: Medicare Other | Admitting: Orthopaedic Surgery

## 2016-09-27 ENCOUNTER — Encounter: Payer: Self-pay | Admitting: Orthopaedic Surgery

## 2016-09-27 ENCOUNTER — Ambulatory Visit: Payer: Medicare Other

## 2016-09-27 VITALS — BP 119/73 | HR 78 | Temp 97.3°F | Ht 60.0 in | Wt 160.0 lb

## 2016-09-27 DIAGNOSIS — G8929 Other chronic pain: Secondary | ICD-10-CM

## 2016-09-27 DIAGNOSIS — M25551 Pain in right hip: Secondary | ICD-10-CM | POA: Diagnosis not present

## 2016-09-27 DIAGNOSIS — F17210 Nicotine dependence, cigarettes, uncomplicated: Secondary | ICD-10-CM

## 2016-09-27 DIAGNOSIS — M25552 Pain in left hip: Secondary | ICD-10-CM

## 2016-09-27 DIAGNOSIS — M25512 Pain in left shoulder: Secondary | ICD-10-CM | POA: Diagnosis not present

## 2016-09-27 MED ORDER — OXYCODONE HCL 10 MG PO TABS: 10.0000 mg | ORAL_TABLET | ORAL | 0 refills | Status: DC | PRN

## 2016-09-27 NOTE — Patient Instructions (Signed)
Steps to Quit Smoking Smoking tobacco can be bad for your health. It can also affect almost every organ in your body. Smoking puts you and people around you at risk for many serious long-lasting (chronic) diseases. Quitting smoking is hard, but it is one of the best things that you can do for your health. It is never too late to quit. What are the benefits of quitting smoking? When you quit smoking, you lower your risk for getting serious diseases and conditions. They can include:  Lung cancer or lung disease.  Heart disease.  Stroke.  Heart attack.  Not being able to have children (infertility).  Weak bones (osteoporosis) and broken bones (fractures).  If you have coughing, wheezing, and shortness of breath, those symptoms may get better when you quit. You may also get sick less often. If you are pregnant, quitting smoking can help to lower your chances of having a baby of low birth weight. What can I do to help me quit smoking? Talk with your doctor about what can help you quit smoking. Some things you can do (strategies) include:  Quitting smoking totally, instead of slowly cutting back how much you smoke over a period of time.  Going to in-person counseling. You are more likely to quit if you go to many counseling sessions.  Using resources and support systems, such as: ? Online chats with a counselor. ? Phone quitlines. ? Printed self-help materials. ? Support groups or group counseling. ? Text messaging programs. ? Mobile phone apps or applications.  Taking medicines. Some of these medicines may have nicotine in them. If you are pregnant or breastfeeding, do not take any medicines to quit smoking unless your doctor says it is okay. Talk with your doctor about counseling or other things that can help you.  Talk with your doctor about using more than one strategy at the same time, such as taking medicines while you are also going to in-person counseling. This can help make  quitting easier. What things can I do to make it easier to quit? Quitting smoking might feel very hard at first, but there is a lot that you can do to make it easier. Take these steps:  Talk to your family and friends. Ask them to support and encourage you.  Call phone quitlines, reach out to support groups, or work with a counselor.  Ask people who smoke to not smoke around you.  Avoid places that make you want (trigger) to smoke, such as: ? Bars. ? Parties. ? Smoke-break areas at work.  Spend time with people who do not smoke.  Lower the stress in your life. Stress can make you want to smoke. Try these things to help your stress: ? Getting regular exercise. ? Deep-breathing exercises. ? Yoga. ? Meditating. ? Doing a body scan. To do this, close your eyes, focus on one area of your body at a time from head to toe, and notice which parts of your body are tense. Try to relax the muscles in those areas.  Download or buy apps on your mobile phone or tablet that can help you stick to your quit plan. There are many free apps, such as QuitGuide from the CDC (Centers for Disease Control and Prevention). You can find more support from smokefree.gov and other websites.  This information is not intended to replace advice given to you by your health care provider. Make sure you discuss any questions you have with your health care provider. Document Released: 01/13/2009 Document   Revised: 11/15/2015 Document Reviewed: 08/03/2014 Elsevier Interactive Patient Education  2018 Elsevier Inc.  

## 2016-09-27 NOTE — Telephone Encounter (Signed)
A week too early, not due until the 10th or 12th

## 2016-09-27 NOTE — Progress Notes (Signed)
Patient RC:[id number] [name], female DOB:1961-04-01, 56 y.o. FBP:[id number]  Chief Complaint  Patient presents with  . Follow-up    shoulder and hip pain    HPI  [name] is a 56 y.o. female who has chronic left shoulder pain.  She has more pain over the last two weeks. She has no swelling, no redness, no paresthesias.  She has pain of the left hip with weight bearing. She has no trauma, no redness, no paresthesias.  She has been using a cane.  HPI  Body mass index is 31.25 kg/m.  ROS  Review of Systems  Constitutional:       Patient does not have Diabetes Mellitus. Patient has hypertension. Patient has COPD or shortness of breath. Patient has BMI > 35. Patient has current smoking history.  HENT: Negative for congestion.   Respiratory: Positive for cough and shortness of breath.        Sleep apnea  Cardiovascular: Negative for chest pain.  Gastrointestinal:       GERD  Endocrine: Positive for cold intolerance.  Musculoskeletal: Positive for arthralgias and back pain.  Allergic/Immunologic: Positive for environmental allergies.  Neurological: Positive for headaches.    Past Medical History:  Diagnosis Date  . Back pain   . Depression   . GERD (gastroesophageal reflux disease)   . Hypertension    diet control  . Hypothyroid   . IBS (irritable bowel syndrome)   . Migraines   . Obesity   . Osteoarthritis   . Sleep apnea     Past Surgical History:  Procedure Laterality Date  . AMPUTATION  12/13/2011   Procedure: AMPUTATION DIGIT;  Surgeon: Marcheta Grammes, DPM;  Location: [facility];  Service: Orthopedics;  Laterality: Right;  amputation second toe right foot  . BACK SURGERY    . BIOPSY  12/23/2015   Procedure: BIOPSY;  Surgeon: Rogene Houston, MD;  [facility];  Service: Endoscopy;;  pre pyloric patches  . BREAST BIOPSY    . CHOLECYSTECTOMY    . ESOPHAGOGASTRODUODENOSCOPY  05/28/2011   Procedure: ESOPHAGOGASTRODUODENOSCOPY (EGD);  Surgeon:  Rogene Houston, MD;  [facility];  Service: Endoscopy;  Laterality: N/A;  730  . ESOPHAGOGASTRODUODENOSCOPY N/A 10/09/2013   Procedure: ESOPHAGOGASTRODUODENOSCOPY (EGD);  Surgeon: Rogene Houston, MD;  [facility];  Service: Endoscopy;  Laterality: N/A;  730  . ESOPHAGOGASTRODUODENOSCOPY (EGD) WITH PROPOFOL N/A 12/23/2015   Procedure: ESOPHAGOGASTRODUODENOSCOPY (EGD) WITH PROPOFOL;  Surgeon: Rogene Houston, MD;  [facility]: [facility];  Service: Endoscopy;  Laterality: N/A;  . HERNIA REPAIR    . MALONEY DILATION N/A 10/09/2013   Procedure: MALONEY DILATION;  Surgeon: Rogene Houston, MD;  [facility]: [facility];  Service: Endoscopy;  Laterality: N/A;  . NASAL SEPTUM SURGERY    . NECK SURGERY     2 yrs ago  . TOTAL ABDOMINAL HYSTERECTOMY     Left oophroectomy for a lare tumor about 21 yrs. RT ovary removed time of hysterectomy    No family history on file.  Social History Social History  Substance Use Topics  . Smoking status: Current Every Day Smoker    Packs/day: 0.50    Years: 35.00  . Smokeless tobacco: Never Used     Comment: 1 pack a day since age18  . Alcohol use No    Allergies  Allergen Reactions  . Cephalexin Hives    unknown  . Latex Rash  . Betadine [Povidone Iodine] Rash  .  Cleocin [Clindamycin Hcl] Hives  . Sulfa Antibiotics     Vomiting     Current Outpatient Prescriptions  Medication Sig Dispense Refill  . beta carotene w/minerals (OCUVITE) tablet Take 1 tablet by mouth daily.      Marland Kitchen esomeprazole (NEXIUM) 40 MG capsule Take 1 capsule (40 mg total) by mouth daily before breakfast.    . fentaNYL (DURAGESIC - DOSED MCG/HR) 100 MCG/HR Place 1 patch onto the skin every 3 (three) days.      . furosemide (LASIX) 40 MG tablet Take 40 mg by mouth 3 (three) times daily.      Marland Kitchen HYDROcodone-acetaminophen (NORCO) 10-325 MG tablet Take 1 tablet by mouth every 4 (four) hours as needed. 90 tablet 0  . levothyroxine (SYNTHROID, LEVOTHROID) 137 MCG  tablet Take 137 mcg by mouth daily before breakfast.    . LORazepam (ATIVAN) 0.5 MG tablet Take 0.5 mg by mouth every 8 (eight) hours.    . metoprolol tartrate (LOPRESSOR) 25 MG tablet Take 25 mg by mouth 2 (two) times daily.     . Multiple Vitamins-Iron (MULTIVITAMINS WITH IRON) TABS Take 1 tablet by mouth daily.    . Oxycodone HCl 10 MG TABS Take 1 tablet (10 mg total) by mouth every 4 (four) hours as needed (Must last 30 days.). pain 170 tablet 0  . oxyCODONE-acetaminophen (PERCOCET) 10-325 MG tablet One every four hours as needed for pain.  Must last 30 days. 180 tablet 0  . polyethylene glycol (MIRALAX / GLYCOLAX) packet Take 17 g by mouth 2 (two) times daily before a meal.      . pregabalin (LYRICA) 100 MG capsule Take 100 mg by mouth 3 (three) times daily.      . sucralfate (CARAFATE) 1 g tablet TAKE 1 TABLET BY MOUTH FOUR TIMES DAILY WITH MEALS AND AT BEDTIME 120 tablet 2  . tiZANidine (ZANAFLEX) 4 MG capsule Take 4 mg by mouth every 6 (six) hours.     . topiramate (TOPAMAX) 100 MG tablet Take 100 mg by mouth 2 (two) times daily.      . traMADol (ULTRAM) 50 MG tablet Take 50 mg by mouth at bedtime.     . traZODone (DESYREL) 50 MG tablet Take 50 mg by mouth at bedtime.    . VENTOLIN HFA 108 (90 Base) MCG/ACT inhaler Inhale 1-2 puffs into the lungs as needed.     . zolpidem (AMBIEN) 10 MG tablet Take 10 mg by mouth at bedtime.      No current facility-administered medications for this visit.      Physical Exam  Blood pressure 119/73, pulse 78, temperature 97.3 F (36.3 C), height 5' (1.524 m), weight 160 lb (72.6 kg).  Constitutional: overall normal hygiene, normal nutrition, well developed, normal grooming, normal body habitus. Assistive device:cane  Musculoskeletal: gait and station Limp left, muscle tone and strength are normal, no tremors or atrophy is present.  .  Neurological: coordination overall normal.  Deep tendon reflex/nerve stretch intact.  Sensation normal.  Cranial  nerves II-XII intact.   Skin:   Normal overall no scars, lesions, ulcers or rashes. No psoriasis.  Psychiatric: Alert and oriented x 3.  Recent memory intact, remote memory unclear.  Normal mood and affect. Well groomed.  Good eye contact.  Cardiovascular: overall no swelling, no varicosities, no edema bilaterally, normal temperatures of the legs and arms, no clubbing, cyanosis and good capillary refill.  Lymphatic: palpation is normal.  Examination of left Upper Extremity is done.  Inspection:  Overall:  Elbow non-tender without crepitus or defects, forearm non-tender without crepitus or defects, wrist non-tender without crepitus or defects, hand non-tender.    Shoulder: with glenohumeral joint tenderness, without effusion.   Upper arm: without swelling and tenderness   Range of motion:   Overall:  Full range of motion of the elbow, full range of motion of wrist and full range of motion in fingers.   Shoulder:  left  150 degrees forward flexion; 140 degrees abduction; 30 degrees internal rotation, 30 degrees external rotation, 15 degrees extension, 40 degrees adduction.   Stability:   Overall:  Shoulder, elbow and wrist stable   Strength and Tone:   Overall full shoulder muscles strength, full upper arm strength and normal upper arm bulk and tone.  Left hip is tender.  NV intact. ROM is full.  She uses a cane. The patient has been educated about the nature of the problem(s) and counseled on treatment options.  The patient appeared to understand what I have discussed and is in agreement with it.  Encounter Diagnoses  Name Primary?  . Hip pain, chronic, left Yes  . Right hip pain   . Chronic left shoulder pain   . Cigarette nicotine dependence without complication    X-rays were done of the pelvis, reported separately.  She continues to smoke.  PROCEDURE NOTE:  The patient request injection, verbal consent was obtained.  The left shoulder was prepped appropriately after  time out was performed.   Sterile technique was observed and injection of 1 cc of Depo-Medrol 40 mg with several cc's of plain xylocaine. Anesthesia was provided by ethyl chloride and a 20-gauge needle was used to inject the shoulder area. A posterior approach was used.  The injection was tolerated well.  A band aid dressing was applied.  The patient was advised to apply ice later today and tomorrow to the injection sight as needed.    PLAN Call if any problems.  Precautions discussed.  Continue current medications.   Return to clinic 1 month   I have reviewed the [facility] web site prior to prescribing narcotic medicine for this patient.  Electronically Signed Sanjuana Kava, MD 6/28/201811:19 AM

## 2016-10-04 ENCOUNTER — Ambulatory Visit: Payer: Medicare Other | Admitting: Orthopaedic Surgery

## 2016-10-08 ENCOUNTER — Telehealth: Payer: Self-pay | Admitting: Orthopaedic Surgery

## 2016-10-08 MED ORDER — OXYCODONE-ACETAMINOPHEN 10-325 MG PO TABS: ORAL_TABLET | ORAL | 0 refills | Status: DC

## 2016-10-08 NOTE — Telephone Encounter (Signed)
Oxycodone-Acetaminophen 10/325 mg  Qty 180 Tablets °

## 2016-10-18 ENCOUNTER — Encounter: Payer: Self-pay | Admitting: Cardiology

## 2016-10-18 ENCOUNTER — Ambulatory Visit (INDEPENDENT_AMBULATORY_CARE_PROVIDER_SITE_OTHER): Payer: Medicare Other | Admitting: Cardiology

## 2016-10-18 VITALS — BP 124/71 | HR 68 | Ht 60.0 in | Wt 160.0 lb

## 2016-10-18 DIAGNOSIS — R079 Chest pain, unspecified: Secondary | ICD-10-CM | POA: Diagnosis not present

## 2016-10-18 NOTE — Progress Notes (Signed)
Clinical Summary [name] is a 56 y.o.female seen as new patient consult for chest pain, referred by Dr Scotty Court  1. Chest pain - started about 4 months ago - initial episode occurred whle sleeping. Heaviness on chest radiating down left arm with associated numbness, and pain radiating to left chest. 20/10 in severity. +SOB. + diaphoresis. Worst with movement. Pain lasted 30 minutes. - has had repeat episodes since that time - chronic arthritis. Can have pain with position - increased DOE, recent bilaeral LE edema   CAD risk factors: HTN, diet controlled DM2 previously on insulin, +tobacco x 25 years. Mother "heart troubles" in her mid 49s, multiple siblings with heart troubles at early age.     Past Medical History:  Diagnosis Date  . Back pain   . Depression   . GERD (gastroesophageal reflux disease)   . Hypertension    diet control  . Hypothyroid   . IBS (irritable bowel syndrome)   . Migraines   . Obesity   . Osteoarthritis   . Sleep apnea      Allergies  Allergen Reactions  . Cephalexin Hives    unknown  . Latex Rash  . Betadine [Povidone Iodine] Rash  . Cleocin [Clindamycin Hcl] Hives  . Sulfa Antibiotics     Vomiting      Current Outpatient Prescriptions  Medication Sig Dispense Refill  . beta carotene w/minerals (OCUVITE) tablet Take 1 tablet by mouth daily.      Marland Kitchen esomeprazole (NEXIUM) 40 MG capsule Take 1 capsule (40 mg total) by mouth daily before breakfast.    . fentaNYL (DURAGESIC - DOSED MCG/HR) 100 MCG/HR Place 1 patch onto the skin every 3 (three) days.      . furosemide (LASIX) 40 MG tablet Take 40 mg by mouth 3 (three) times daily.      Marland Kitchen HYDROcodone-acetaminophen (NORCO) 10-325 MG tablet Take 1 tablet by mouth every 4 (four) hours as needed. 90 tablet 0  . levothyroxine (SYNTHROID, LEVOTHROID) 137 MCG tablet Take 137 mcg by mouth daily before breakfast.    . LORazepam (ATIVAN) 0.5 MG tablet Take 0.5 mg by mouth every 8 (eight) hours.    .  metoprolol tartrate (LOPRESSOR) 25 MG tablet Take 25 mg by mouth 2 (two) times daily.     . Multiple Vitamins-Iron (MULTIVITAMINS WITH IRON) TABS Take 1 tablet by mouth daily.    . Oxycodone HCl 10 MG TABS Take 1 tablet (10 mg total) by mouth every 4 (four) hours as needed (Must last 30 days.). pain 170 tablet 0  . oxyCODONE-acetaminophen (PERCOCET) 10-325 MG tablet One every four hours as needed for pain.  Must last 30 days. 180 tablet 0  . polyethylene glycol (MIRALAX / GLYCOLAX) packet Take 17 g by mouth 2 (two) times daily before a meal.      . pregabalin (LYRICA) 100 MG capsule Take 100 mg by mouth 3 (three) times daily.      . sucralfate (CARAFATE) 1 g tablet TAKE 1 TABLET BY MOUTH FOUR TIMES DAILY WITH MEALS AND AT BEDTIME 120 tablet 2  . tiZANidine (ZANAFLEX) 4 MG capsule Take 4 mg by mouth every 6 (six) hours.     . topiramate (TOPAMAX) 100 MG tablet Take 100 mg by mouth 2 (two) times daily.      . traMADol (ULTRAM) 50 MG tablet Take 50 mg by mouth at bedtime.     . traZODone (DESYREL) 50 MG tablet Take 50 mg by mouth at  bedtime.    . VENTOLIN HFA 108 (90 Base) MCG/ACT inhaler Inhale 1-2 puffs into the lungs as needed.     . zolpidem (AMBIEN) 10 MG tablet Take 10 mg by mouth at bedtime.      No current facility-administered medications for this visit.      Past Surgical History:  Procedure Laterality Date  . AMPUTATION  12/13/2011   Procedure: AMPUTATION DIGIT;  Surgeon: Marcheta Grammes, DPM;  Location: [facility];  Service: Orthopedics;  Laterality: Right;  amputation second toe right foot  . BACK SURGERY    . BIOPSY  12/23/2015   Procedure: BIOPSY;  Surgeon: Rogene Houston, MD;  Location: [facility];  Service: Endoscopy;;  pre pyloric patches  . BREAST BIOPSY    . CHOLECYSTECTOMY    . ESOPHAGOGASTRODUODENOSCOPY  05/28/2011   Procedure: ESOPHAGOGASTRODUODENOSCOPY (EGD);  Surgeon: Rogene Houston, MD;  Location: [facility];  Service: Endoscopy;  Laterality: N/A;  730  .  ESOPHAGOGASTRODUODENOSCOPY N/A 10/09/2013   Procedure: ESOPHAGOGASTRODUODENOSCOPY (EGD);  Surgeon: Rogene Houston, MD;  Location: [facility];  Service: Endoscopy;  Laterality: N/A;  730  . ESOPHAGOGASTRODUODENOSCOPY (EGD) WITH PROPOFOL N/A 12/23/2015   Procedure: ESOPHAGOGASTRODUODENOSCOPY (EGD) WITH PROPOFOL;  Surgeon: Rogene Houston, MD;  Location: [facility];  Service: Endoscopy;  Laterality: N/A;  . HERNIA REPAIR    . MALONEY DILATION N/A 10/09/2013   Procedure: MALONEY DILATION;  Surgeon: Rogene Houston, MD;  Location: [facility];  Service: Endoscopy;  Laterality: N/A;  . NASAL SEPTUM SURGERY    . NECK SURGERY     2 yrs ago  . TOTAL ABDOMINAL HYSTERECTOMY     Left oophroectomy for a lare tumor about 21 yrs. RT ovary removed time of hysterectomy     Allergies  Allergen Reactions  . Cephalexin Hives    unknown  . Latex Rash  . Betadine [Povidone Iodine] Rash  . Cleocin [Clindamycin Hcl] Hives  . Sulfa Antibiotics     Vomiting       No family history on file.   Social History [name] reports that she has been smoking.  She has a 17.50 pack-year smoking history. She has never used smokeless tobacco. [name] reports that she does not drink alcohol.   Review of Systems CONSTITUTIONAL: No weight loss, fever, chills, weakness or fatigue.  HEENT: Eyes: No visual loss, blurred vision, double vision or yellow sclerae.No hearing loss, sneezing, congestion, runny nose or sore throat.  SKIN: No rash or itching.  CARDIOVASCULAR: per HPI RESPIRATORY: per hpi GASTROINTESTINAL: No anorexia, nausea, vomiting or diarrhea. No abdominal pain or blood.  GENITOURINARY: No burning on urination, no polyuria NEUROLOGICAL: No headache, dizziness, syncope, paralysis, ataxia, numbness or tingling in the extremities. No change in bowel or bladder control.  MUSCULOSKELETAL: No muscle, back pain, joint pain or stiffness.  LYMPHATICS: No enlarged nodes. No history of splenectomy.    PSYCHIATRIC: No history of depression or anxiety.  ENDOCRINOLOGIC: No reports of sweating, cold or heat intolerance. No polyuria or polydipsia.  Marland Kitchen   Physical Examination Vitals:   10/18/16 1032  BP: 124/71  Pulse: 68   Vitals:   10/18/16 1032  Weight: 160 lb (72.6 kg)  Height: 5' (1.524 m)    Gen: resting comfortably, no acute distress HEENT: no scleral icterus, pupils equal round and reactive, no palptable cervical adenopathy,  CV: RRR, no m/r/g, no jvd Resp: Clear to auscultation bilaterally GI: abdomen is soft, non-tender, non-distended, normal bowel  sounds, no hepatosplenomegaly MSK: extremities are warm, trace bilateral LE edema Skin: warm, no rash Neuro:  no focal deficits Psych: appropriate affect      Assessment and Plan  1. Chest pain - multiple CAD risk factors - EKG in clinic shows SR, no ischemic changes - we initially obtain an echo to evaluate for underlying cardiac dysfunction in setting of DOE and LE edema, pending results likely obtain a lexiscan MPI  F/u pending test results    [name], M.D.

## 2016-10-18 NOTE — Patient Instructions (Signed)
Your physician recommends that you schedule a follow-up appointment in: TO BE DETERMINED AFTER TESTING WITH DR BRANCH  Your physician recommends that you continue on your current medications as directed. Please refer to the Current Medication list given to you today.  Your physician has requested that you have an echocardiogram. Echocardiography is a painless test that uses sound waves to create images of your heart. It provides your doctor with information about the size and shape of your heart and how well your heart's chambers and valves are working. This procedure takes approximately one hour. There are no restrictions for this procedure.  Thank you for choosing [facility]!!    

## 2016-10-23 ENCOUNTER — Encounter: Payer: Self-pay | Admitting: Orthopaedic Surgery

## 2016-10-23 ENCOUNTER — Ambulatory Visit (INDEPENDENT_AMBULATORY_CARE_PROVIDER_SITE_OTHER): Payer: Medicare Other | Admitting: Orthopaedic Surgery

## 2016-10-23 DIAGNOSIS — G8929 Other chronic pain: Secondary | ICD-10-CM | POA: Diagnosis not present

## 2016-10-23 DIAGNOSIS — M25552 Pain in left hip: Secondary | ICD-10-CM

## 2016-10-23 DIAGNOSIS — M25512 Pain in left shoulder: Secondary | ICD-10-CM

## 2016-10-23 MED ORDER — OXYCODONE HCL 10 MG PO TABS: 10.0000 mg | ORAL_TABLET | ORAL | 0 refills | Status: DC | PRN

## 2016-10-23 NOTE — Progress Notes (Signed)
PROCEDURE NOTE:  The patient request injection, verbal consent was obtained.  The left shoulder was prepped appropriately after time out was performed.   Sterile technique was observed and injection of 1 cc of Depo-Medrol 40 mg with several cc's of plain xylocaine. Anesthesia was provided by ethyl chloride and a 20-gauge needle was used to inject the shoulder area. A posterior approach was used.  The injection was tolerated well.  A band aid dressing was applied.  The patient was advised to apply ice later today and tomorrow to the injection sight as needed.  Encounter Diagnoses  Name Primary?  . Chronic left shoulder pain   . Hip pain, chronic, left Yes   Return in one month.  Call if any problem.  Precautions discussed.    Electronically Signed Sanjuana Kava, MD 7/24/201811:10 AM

## 2016-10-25 ENCOUNTER — Ambulatory Visit: Payer: Medicare Other | Admitting: Orthopaedic Surgery

## 2016-11-06 ENCOUNTER — Telehealth: Payer: Self-pay | Admitting: Orthopaedic Surgery

## 2016-11-06 MED ORDER — OXYCODONE-ACETAMINOPHEN 10-325 MG PO TABS: ORAL_TABLET | ORAL | 0 refills | Status: DC

## 2016-11-06 NOTE — Telephone Encounter (Signed)
Patient requests refill on Oxycodone/Acetaminophen (Percocet)  10-325  Mgs.   Qty  180  Sig: One every four hours as needed for pain. Must last 30 days.

## 2016-11-15 ENCOUNTER — Other Ambulatory Visit: Payer: Self-pay

## 2016-11-15 ENCOUNTER — Ambulatory Visit (INDEPENDENT_AMBULATORY_CARE_PROVIDER_SITE_OTHER): Payer: Medicare Other

## 2016-11-15 DIAGNOSIS — R079 Chest pain, unspecified: Secondary | ICD-10-CM

## 2016-11-16 ENCOUNTER — Telehealth: Payer: Self-pay | Admitting: Cardiology

## 2016-11-16 NOTE — Telephone Encounter (Signed)
-----   Message from Massie Maroon, [place] sent at 11/16/2016  3:20 PM EDT -----   ----- Message ----- From: Lendon Colonel, NP Sent: 11/16/2016  11:52 AM To: Marikay Alar Pinnix, LPN  Echocardiogram is normal. No changes in her regimen

## 2016-11-16 NOTE — Telephone Encounter (Signed)
Patient asking for results from her test

## 2016-11-16 NOTE — Telephone Encounter (Signed)
Pt aware - routed to pcp  

## 2016-11-21 ENCOUNTER — Ambulatory Visit (INDEPENDENT_AMBULATORY_CARE_PROVIDER_SITE_OTHER): Payer: Medicare Other | Admitting: Orthopaedic Surgery

## 2016-11-21 DIAGNOSIS — F17210 Nicotine dependence, cigarettes, uncomplicated: Secondary | ICD-10-CM

## 2016-11-21 DIAGNOSIS — G8929 Other chronic pain: Secondary | ICD-10-CM

## 2016-11-21 DIAGNOSIS — M25512 Pain in left shoulder: Secondary | ICD-10-CM

## 2016-11-21 MED ORDER — OXYCODONE HCL 10 MG PO TABS: 10.0000 mg | ORAL_TABLET | ORAL | 0 refills | Status: DC | PRN

## 2016-11-21 NOTE — Progress Notes (Signed)
PROCEDURE NOTE:  The patient request injection, verbal consent was obtained.  The left shoulder was prepped appropriately after time out was performed.   Sterile technique was observed and injection of 1 cc of Depo-Medrol 40 mg with several cc's of plain xylocaine. Anesthesia was provided by ethyl chloride and a 20-gauge needle was used to inject the shoulder area. A posterior approach was used.  The injection was tolerated well.  A band aid dressing was applied.  The patient was advised to apply ice later today and tomorrow to the injection sight as needed.  Encounter Diagnoses  Name Primary?  . Chronic left shoulder pain Yes  . Cigarette nicotine dependence without complication    Return in one month.  She has cut back from two packs a day to just four cigarettes a day.  Very good.  Call if any problem.  Precautions discussed.   Electronically Signed Sanjuana Kava, MD 8/22/201811:33 AM

## 2016-12-04 ENCOUNTER — Telehealth: Payer: Self-pay | Admitting: Orthopaedic Surgery

## 2016-12-04 MED ORDER — OXYCODONE-ACETAMINOPHEN 10-325 MG PO TABS: ORAL_TABLET | ORAL | 0 refills | Status: DC

## 2016-12-04 NOTE — Telephone Encounter (Signed)
Patient requests refill on Oxycodone/Acetaminophen 10-325 mgs.   Qty  180   Sig: One every four hours as needed for pain. Must last 30 days.

## 2016-12-17 DIAGNOSIS — G43019 Migraine without aura, intractable, without status migrainosus: Secondary | ICD-10-CM | POA: Insufficient documentation

## 2016-12-19 ENCOUNTER — Ambulatory Visit: Payer: Medicare Other | Admitting: Orthopaedic Surgery

## 2016-12-20 ENCOUNTER — Encounter: Payer: Self-pay | Admitting: Orthopaedic Surgery

## 2016-12-20 ENCOUNTER — Ambulatory Visit (INDEPENDENT_AMBULATORY_CARE_PROVIDER_SITE_OTHER): Payer: Medicare Other | Admitting: Orthopaedic Surgery

## 2016-12-20 DIAGNOSIS — M25552 Pain in left hip: Secondary | ICD-10-CM

## 2016-12-20 DIAGNOSIS — F17210 Nicotine dependence, cigarettes, uncomplicated: Secondary | ICD-10-CM

## 2016-12-20 DIAGNOSIS — M25512 Pain in left shoulder: Secondary | ICD-10-CM

## 2016-12-20 DIAGNOSIS — M545 Low back pain, unspecified: Secondary | ICD-10-CM

## 2016-12-20 DIAGNOSIS — G8929 Other chronic pain: Secondary | ICD-10-CM | POA: Diagnosis not present

## 2016-12-20 MED ORDER — OXYCODONE HCL 10 MG PO TABS: 10.0000 mg | ORAL_TABLET | ORAL | 0 refills | Status: DC | PRN

## 2016-12-20 NOTE — Patient Instructions (Signed)
Steps to Quit Smoking Smoking tobacco can be bad for your health. It can also affect almost every organ in your body. Smoking puts you and people around you at risk for many serious Kynlie Jane-lasting (chronic) diseases. Quitting smoking is hard, but it is one of the best things that you can do for your health. It is never too late to quit. What are the benefits of quitting smoking? When you quit smoking, you lower your risk for getting serious diseases and conditions. They can include:  Lung cancer or lung disease.  Heart disease.  Stroke.  Heart attack.  Not being able to have children (infertility).  Weak bones (osteoporosis) and broken bones (fractures).  If you have coughing, wheezing, and shortness of breath, those symptoms may get better when you quit. You may also get sick less often. If you are pregnant, quitting smoking can help to lower your chances of having a baby of low birth weight. What can I do to help me quit smoking? Talk with your doctor about what can help you quit smoking. Some things you can do (strategies) include:  Quitting smoking totally, instead of slowly cutting back how much you smoke over a period of time.  Going to in-person counseling. You are more likely to quit if you go to many counseling sessions.  Using resources and support systems, such as: ? Online chats with a counselor. ? Phone quitlines. ? Printed self-help materials. ? Support groups or group counseling. ? Text messaging programs. ? Mobile phone apps or applications.  Taking medicines. Some of these medicines may have nicotine in them. If you are pregnant or breastfeeding, do not take any medicines to quit smoking unless your doctor says it is okay. Talk with your doctor about counseling or other things that can help you.  Talk with your doctor about using more than one strategy at the same time, such as taking medicines while you are also going to in-person counseling. This can help make  quitting easier. What things can I do to make it easier to quit? Quitting smoking might feel very hard at first, but there is a lot that you can do to make it easier. Take these steps:  Talk to your family and friends. Ask them to support and encourage you.  Call phone quitlines, reach out to support groups, or work with a counselor.  Ask people who smoke to not smoke around you.  Avoid places that make you want (trigger) to smoke, such as: ? Bars. ? Parties. ? Smoke-break areas at work.  Spend time with people who do not smoke.  Lower the stress in your life. Stress can make you want to smoke. Try these things to help your stress: ? Getting regular exercise. ? Deep-breathing exercises. ? Yoga. ? Meditating. ? Doing a body scan. To do this, close your eyes, focus on one area of your body at a time from head to toe, and notice which parts of your body are tense. Try to relax the muscles in those areas.  Download or buy apps on your mobile phone or tablet that can help you stick to your quit plan. There are many free apps, such as QuitGuide from the CDC (Centers for Disease Control and Prevention). You can find more support from smokefree.gov and other websites.  This information is not intended to replace advice given to you by your health care provider. Make sure you discuss any questions you have with your health care provider. Document Released: 01/13/2009 Document   Revised: 11/15/2015 Document Reviewed: 08/03/2014 Elsevier Interactive Patient Education  2018 Elsevier Inc.  

## 2016-12-20 NOTE — Progress Notes (Signed)
PROCEDURE NOTE:  The patient request injection, verbal consent was obtained.  The left shoulder was prepped appropriately after time out was performed.   Sterile technique was observed and injection of 1 cc of Depo-Medrol 40 mg with several cc's of plain xylocaine. Anesthesia was provided by ethyl chloride and a 20-gauge needle was used to inject the shoulder area. A posterior approach was used.  The injection was tolerated well.  A band aid dressing was applied.  The patient was advised to apply ice later today and tomorrow to the injection sight as needed.  Encounter Diagnoses  Name Primary?  . Chronic left shoulder pain Yes  . Cigarette nicotine dependence without complication   . Hip pain, chronic, left   . Morbid obesity due to excess calories ([place])   . Chronic midline low back pain without sciatica    Return in one month.  Call if any problem.  Precautions discussed.   Electronically Signed Sanjuana Kava, MD 9/20/201810:32 AM

## 2016-12-25 ENCOUNTER — Ambulatory Visit (INDEPENDENT_AMBULATORY_CARE_PROVIDER_SITE_OTHER): Payer: Medicare Other | Admitting: Internal Medicine

## 2016-12-31 DIAGNOSIS — C50011 Malignant neoplasm of nipple and areola, right female breast: Secondary | ICD-10-CM | POA: Insufficient documentation

## 2017-01-01 ENCOUNTER — Telehealth: Payer: Self-pay | Admitting: Orthopaedic Surgery

## 2017-01-01 NOTE — Telephone Encounter (Signed)
Oxycodone-Acetaminophen  10/325 mg  Qty 180 Tablets.

## 2017-01-02 MED ORDER — OXYCODONE-ACETAMINOPHEN 10-325 MG PO TABS: ORAL_TABLET | ORAL | 0 refills | Status: DC

## 2017-01-17 ENCOUNTER — Ambulatory Visit (INDEPENDENT_AMBULATORY_CARE_PROVIDER_SITE_OTHER): Payer: Medicare Other | Admitting: Orthopaedic Surgery

## 2017-01-17 DIAGNOSIS — M25512 Pain in left shoulder: Secondary | ICD-10-CM

## 2017-01-17 DIAGNOSIS — G8929 Other chronic pain: Secondary | ICD-10-CM | POA: Diagnosis not present

## 2017-01-17 MED ORDER — OXYCODONE-ACETAMINOPHEN 10-325 MG PO TABS: ORAL_TABLET | ORAL | 0 refills | Status: DC

## 2017-01-17 NOTE — Progress Notes (Signed)
PROCEDURE NOTE:  The patient request injection, verbal consent was obtained.  The left shoulder was prepped appropriately after time out was performed.   Sterile technique was observed and injection of 1 cc of Depo-Medrol 40 mg with several cc's of plain xylocaine. Anesthesia was provided by ethyl chloride and a 20-gauge needle was used to inject the shoulder area. A posterior approach was used.  The injection was tolerated well.  A band aid dressing was applied.  The patient was advised to apply ice later today and tomorrow to the injection sight as needed.  She has had back pain and had a MRI this past week.  The procedure was ordered by Dr. Scotty Court.  I have reviewed the [place] web site prior to prescribing narcotic medicine for this patient.  Return in one month.  Call if any problem.  Precautions discussed.   Electronically Signed Sanjuana Kava, MD 10/18/201810:44 AM

## 2017-01-29 ENCOUNTER — Ambulatory Visit (INDEPENDENT_AMBULATORY_CARE_PROVIDER_SITE_OTHER): Payer: Medicare Other | Admitting: Orthopaedic Surgery

## 2017-01-29 ENCOUNTER — Encounter: Payer: Self-pay | Admitting: Orthopaedic Surgery

## 2017-01-29 VITALS — BP 126/79 | HR 94 | Temp 98.6°F | Ht 60.0 in | Wt 145.0 lb

## 2017-01-29 DIAGNOSIS — F17210 Nicotine dependence, cigarettes, uncomplicated: Secondary | ICD-10-CM | POA: Diagnosis not present

## 2017-01-29 DIAGNOSIS — G8929 Other chronic pain: Secondary | ICD-10-CM | POA: Diagnosis not present

## 2017-01-29 DIAGNOSIS — M25512 Pain in left shoulder: Secondary | ICD-10-CM

## 2017-01-29 DIAGNOSIS — G894 Chronic pain syndrome: Secondary | ICD-10-CM | POA: Diagnosis not present

## 2017-01-29 MED ORDER — OXYCODONE HCL 10 MG PO TABS: 10.0000 mg | ORAL_TABLET | ORAL | 0 refills | Status: DC | PRN

## 2017-01-29 NOTE — Progress Notes (Signed)
Patient RS:WNIOEVO [name], female DOB:[birth year], 56 y.o. JJK:[id number]  Chief Complaint  Patient presents with  . Shoulder Pain    left      HPI   [name] is a 56 y.o. female who has chronic pain and lower back pain.  She has been told her family doctor is retiring.  He is giving her pain patches and I have been giving pain pills.  She is concerned she will not get the pain patches.  I have talked to her pharmacist about the pain medicine she is taking.  She has no new trauma.  She had MRI recently and is scheduled to see neurosurgeon soon.  Her left shoulder is painful but the injection last time helped.  She has cut back on her smoking and is down to four cigarettes a day. HPI  Body mass index is 28.32 kg/m.  ROS  Review of Systems  Constitutional:       Patient does not have Diabetes Mellitus. Patient has hypertension. Patient has COPD or shortness of breath. Patient has BMI > 35. Patient has current smoking history.  HENT: Negative for congestion.   Respiratory: Positive for cough and shortness of breath.        Sleep apnea  Cardiovascular: Negative for chest pain.  Gastrointestinal:       GERD  Endocrine: Positive for cold intolerance.  Musculoskeletal: Positive for arthralgias and back pain.  Allergic/Immunologic: Positive for environmental allergies.  Neurological: Positive for headaches.    Past Medical History:  Diagnosis Date  . Back pain   . Depression   . GERD (gastroesophageal reflux disease)   . Hypertension    diet control  . Hypothyroid   . IBS (irritable bowel syndrome)   . Migraines   . Obesity   . Osteoarthritis   . Sleep apnea     Past Surgical History:  Procedure Laterality Date  . AMPUTATION  12/13/2011   Procedure: AMPUTATION DIGIT;  Surgeon: Marcheta Grammes, DPM;  Location: [facility];  Service: Orthopedics;  Laterality: Right;  amputation second toe right foot  . BACK SURGERY    . BIOPSY  12/23/2015   Procedure: BIOPSY;   Surgeon: Rogene Houston, MD;  [facility];  Service: Endoscopy;;  pre pyloric patches  . BREAST BIOPSY    . CHOLECYSTECTOMY    . ESOPHAGOGASTRODUODENOSCOPY  05/28/2011   Procedure: ESOPHAGOGASTRODUODENOSCOPY (EGD);  Surgeon: Rogene Houston, MD;  [facility];  Service: Endoscopy;  Laterality: N/A;  730  . ESOPHAGOGASTRODUODENOSCOPY N/A 10/09/2013   Procedure: ESOPHAGOGASTRODUODENOSCOPY (EGD);  Surgeon: Rogene Houston, MD;  [facility];  Service: Endoscopy;  Laterality: N/A;  730  . ESOPHAGOGASTRODUODENOSCOPY (EGD) WITH PROPOFOL N/A 12/23/2015   Procedure: ESOPHAGOGASTRODUODENOSCOPY (EGD) WITH PROPOFOL;  Surgeon: Rogene Houston, MD;  [facility];  Service: Endoscopy;  Laterality: N/A;  . HERNIA REPAIR    . MALONEY DILATION N/A 10/09/2013   Procedure: MALONEY DILATION;  Surgeon: Rogene Houston, MD;  [facility];  Service: Endoscopy;  Laterality: N/A;  . NASAL SEPTUM SURGERY    . NECK SURGERY     2 yrs ago  . TOTAL ABDOMINAL HYSTERECTOMY     Left oophroectomy for a lare tumor about 21 yrs. RT ovary removed time of hysterectomy    No family history on file.  Social History Social History  Substance Use Topics  . Smoking status: Current Every Day Smoker    Packs/day: 0.50  Years: 35.00  . Smokeless tobacco: Never Used     Comment: 1 pack a day since age18  . Alcohol use No    Allergies  Allergen Reactions  . Cephalexin Hives    unknown  . Latex Rash  . Betadine [Povidone Iodine] Rash  . Cleocin [Clindamycin Hcl] Hives  . Sulfa Antibiotics     Vomiting     Current Outpatient Prescriptions  Medication Sig Dispense Refill  . beta carotene w/minerals (OCUVITE) tablet Take 1 tablet by mouth daily.      Marland Kitchen esomeprazole (NEXIUM) 40 MG capsule Take 1 capsule (40 mg total) by mouth daily before breakfast.    . fentaNYL (DURAGESIC - DOSED MCG/HR) 100 MCG/HR Place 1 patch onto the skin every 3 (three) days.      . furosemide  (LASIX) 40 MG tablet Take 40 mg by mouth 3 (three) times daily.      Marland Kitchen levothyroxine (SYNTHROID, LEVOTHROID) 137 MCG tablet Take 137 mcg by mouth daily before breakfast.    . LORazepam (ATIVAN) 0.5 MG tablet Take 0.5 mg by mouth every 8 (eight) hours.    . metoprolol tartrate (LOPRESSOR) 25 MG tablet Take 25 mg by mouth 2 (two) times daily.     . Multiple Vitamins-Iron (MULTIVITAMINS WITH IRON) TABS Take 1 tablet by mouth daily.    . Oxycodone HCl 10 MG TABS Take 1 tablet (10 mg total) by mouth every 4 (four) hours as needed (Must last 30 days.). pain 170 tablet 0  . oxyCODONE-acetaminophen (PERCOCET) 10-325 MG tablet One every four hours as needed for pain.  Must last 30 days. 180 tablet 0  . polyethylene glycol (MIRALAX / GLYCOLAX) packet Take 17 g by mouth 2 (two) times daily before a meal.      . pregabalin (LYRICA) 100 MG capsule Take 100 mg by mouth 3 (three) times daily.      . sucralfate (CARAFATE) 1 g tablet TAKE 1 TABLET BY MOUTH FOUR TIMES DAILY WITH MEALS AND AT BEDTIME 120 tablet 2  . tiZANidine (ZANAFLEX) 4 MG capsule Take 4 mg by mouth every 6 (six) hours.     . topiramate (TOPAMAX) 100 MG tablet Take 100 mg by mouth 2 (two) times daily.      . traMADol (ULTRAM) 50 MG tablet Take 50 mg by mouth at bedtime.     . traZODone (DESYREL) 50 MG tablet Take 50 mg by mouth at bedtime.    . VENTOLIN HFA 108 (90 Base) MCG/ACT inhaler Inhale 1-2 puffs into the lungs as needed.     . zolpidem (AMBIEN) 10 MG tablet Take 10 mg by mouth at bedtime.      No current facility-administered medications for this visit.      Physical Exam  Blood pressure 126/79, pulse 94, temperature 98.6 F (37 C), height 5' (1.524 m), weight 145 lb (65.8 kg).  Constitutional: overall normal hygiene, normal nutrition, well developed, normal grooming, normal body habitus. Assistive device:none  Musculoskeletal: gait and station Limp none, muscle tone and strength are normal, no tremors or atrophy is present.  .   Neurological: coordination overall normal.  Deep tendon reflex/nerve stretch intact.  Sensation normal.  Cranial nerves II-XII intact.   Skin:   Normal overall no scars, lesions, ulcers or rashes. No psoriasis.  Psychiatric: Alert and oriented x 3.  Recent memory intact, remote memory unclear.  Normal mood and affect. Well groomed.  Good eye contact.  Cardiovascular: overall no swelling, no varicosities, no edema bilaterally,  normal temperatures of the legs and arms, no clubbing, cyanosis and good capillary refill.  Lymphatic: palpation is normal.  All other systems reviewed and are negative   Spine/Pelvis examination:  Inspection:  Overall, sacoiliac joint benign and hips tender; without crepitus or defects.   Thoracic spine inspection: Alignment normal with kyphosis present   Lumbar spine inspection:  Alignment  with normal lumbar lordosis, with scoliosis apparent.   Thoracic spine palpation:  with tenderness of spinal processes   Lumbar spine palpation: with tenderness of lumbar area; without tightness of lumbar muscles    Range of Motion:   Lumbar flexion, forward flexion is 30 with pain or tenderness    Lumbar extension is 5 with pain or tenderness   Left lateral bend is Normal  without pain or tenderness   Right lateral bend is Normal without pain or tenderness   Straight leg raising is Normal   Strength & tone: Normal   Stability overall normal stability    The patient has been educated about the nature of the problem(s) and counseled on treatment options.  The patient appeared to understand what I have discussed and is in agreement with it.  Encounter Diagnoses  Name Primary?  . Chronic left shoulder pain Yes  . Pain syndrome, chronic   . Cigarette nicotine dependence without complication     PLAN Call if any problems.  Precautions discussed.  Continue current medications.   Return to clinic 1 month   I have reviewed the [place] web site prior to prescribing narcotic medicine for this patient.  Electronically Signed Sanjuana Kava, MD 10/30/201811:25 AM

## 2017-01-29 NOTE — Patient Instructions (Signed)
Coping with Quitting Smoking Quitting smoking is a physical and mental challenge. You will face cravings, withdrawal symptoms, and temptation. Before quitting, work with your health care provider to make a plan that can help you cope. Preparation can help you quit and keep you from giving in. How can I cope with cravings? Cravings usually last for 5-10 minutes. If you get through it, the craving will pass. Consider taking the following actions to help you cope with cravings:  Keep your mouth busy: ? Chew sugar-free gum. ? Suck on hard candies or a straw. ? Brush your teeth.  Keep your hands and body busy: ? Immediately change to a different activity when you feel a craving. ? Squeeze or play with a ball. ? Do an activity or a hobby, like making bead jewelry, practicing needlepoint, or working with wood. ? Mix up your normal routine. ? Take a short exercise break. Go for a quick walk or run up and down stairs. ? Spend time in public places where smoking is not allowed.  Focus on doing something kind or helpful for someone else.  Call a friend or family member to talk during a craving.  Join a support group.  Call a quit line, such as 1-800-QUIT-NOW.  Talk with your health care provider about medicines that might help you cope with cravings and make quitting easier for you.  How can I deal with withdrawal symptoms? Your body may experience negative effects as it tries to get used to not having nicotine in the system. These effects are called withdrawal symptoms. They may include:  Feeling hungrier than normal.  Trouble concentrating.  Irritability.  Trouble sleeping.  Feeling depressed.  Restlessness and agitation.  Craving a cigarette.  To manage withdrawal symptoms:  Avoid places, people, and activities that trigger your cravings.  Remember why you want to quit.  Get plenty of sleep.  Avoid coffee and other caffeinated drinks. These may worsen some of your  symptoms.  How can I handle social situations? Social situations can be difficult when you are quitting smoking, especially in the first few weeks. To manage this, you can:  Avoid parties, bars, and other social situations where people might be smoking.  Avoid alcohol.  Leave right away if you have the urge to smoke.  Explain to your family and friends that you are quitting smoking. Ask for understanding and support.  Plan activities with friends or family where smoking is not an option.  What are some ways I can cope with stress? Wanting to smoke may cause stress, and stress can make you want to smoke. Find ways to manage your stress. Relaxation techniques can help. For example:  Breathe slowly and deeply, in through your nose and out through your mouth.  Listen to soothing, relaxing music.  Talk with a family member or friend about your stress.  Light a candle.  Soak in a bath or take a shower.  Think about a peaceful place.  What are some ways I can prevent weight gain? Be aware that many people gain weight after they quit smoking. However, not everyone does. To keep from gaining weight, have a plan in place before you quit and stick to the plan after you quit. Your plan should include:  Having healthy snacks. When you have a craving, it may help to: ? Eat plain popcorn, crunchy carrots, celery, or other cut vegetables. ? Chew sugar-free gum.  Changing how you eat: ? Eat small portion sizes at meals. ?   Eat 4-6 small meals throughout the day instead of 1-2 large meals a day. ? Be mindful when you eat. Do not watch television or do other things that might distract you as you eat.  Exercising regularly: ? Make time to exercise each day. If you do not have time for a long workout, do short bouts of exercise for 5-10 minutes several times a day. ? Do some form of strengthening exercise, like weight lifting, and some form of aerobic exercise, like running or  swimming.  Drinking plenty of water or other low-calorie or no-calorie drinks. Drink 6-8 glasses of water daily, or as much as instructed by your health care provider.  Summary  Quitting smoking is a physical and mental challenge. You will face cravings, withdrawal symptoms, and temptation to smoke again. Preparation can help you as you go through these challenges.  You can cope with cravings by keeping your mouth busy (such as by chewing gum), keeping your body and hands busy, and making calls to family, friends, or a helpline for people who want to quit smoking.  You can cope with withdrawal symptoms by avoiding places where people smoke, avoiding drinks with caffeine, and getting plenty of rest.  Ask your health care provider about the different ways to prevent weight gain, avoid stress, and handle social situations. This information is not intended to replace advice given to you by your health care provider. Make sure you discuss any questions you have with your health care provider. Document Released: 03/16/2016 Document Revised: 03/16/2016 Document Reviewed: 03/16/2016 Elsevier Interactive Patient Education  2018 [employer]  

## 2017-02-13 ENCOUNTER — Encounter: Payer: Self-pay | Admitting: *Deleted

## 2017-02-13 ENCOUNTER — Encounter: Payer: Self-pay | Admitting: Cardiology

## 2017-02-13 ENCOUNTER — Ambulatory Visit (INDEPENDENT_AMBULATORY_CARE_PROVIDER_SITE_OTHER): Payer: Medicare Other | Admitting: Cardiology

## 2017-02-13 VITALS — BP 104/70 | HR 91 | Ht 60.0 in | Wt 147.6 lb

## 2017-02-13 DIAGNOSIS — R079 Chest pain, unspecified: Secondary | ICD-10-CM

## 2017-02-13 NOTE — Progress Notes (Signed)
Clinical Summary Ms. Sweigert is a 56 y.o.female seen today for follow up of the following medical problems.  1. Chest pain - started about 4 months ago - initial episode occurred whle sleeping. Heaviness on chest radiating down left arm with associated numbness, and pain radiating to left chest. 20/10 in severity. +SOB. + diaphoresis. Worst with movement. Pain lasted 30 minutes. - has had repeat episodes since that time - chronic arthritis. Can have pain with position - increased DOE, recent bilaeral LE edema   CAD risk factors: HTN, diet controlled DM2 previously on insulin, +tobacco x 25 years. Mother "heart troubles" in her mid 65s, multiple siblings with heart troubles at early age.   - echo 10/2016 LVEF 60-65%.  - since last visit pain has increased in frequency.   2. Chronic left shoulder pain - followed by pcp  3. Chronic neck/back pain - followed by pcp - prior surgeries Past Medical History:  Diagnosis Date  . Back pain   . Depression   . GERD (gastroesophageal reflux disease)   . Hypertension    diet control  . Hypothyroid   . IBS (irritable bowel syndrome)   . Migraines   . Obesity   . Osteoarthritis   . Sleep apnea      Allergies  Allergen Reactions  . Cephalexin Hives    unknown  . Latex Rash  . Betadine [Povidone Iodine] Rash  . Cleocin [Clindamycin Hcl] Hives  . Sulfa Antibiotics     Vomiting      Current Outpatient Medications  Medication Sig Dispense Refill  . beta carotene w/minerals (OCUVITE) tablet Take 1 tablet by mouth daily.      Marland Kitchen esomeprazole (NEXIUM) 40 MG capsule Take 1 capsule (40 mg total) by mouth daily before breakfast.    . fentaNYL (DURAGESIC - DOSED MCG/HR) 100 MCG/HR Place 1 patch onto the skin every 3 (three) days.      . furosemide (LASIX) 40 MG tablet Take 40 mg by mouth 3 (three) times daily.      Marland Kitchen levothyroxine (SYNTHROID, LEVOTHROID) 137 MCG tablet Take 137 mcg by mouth daily before breakfast.    . LORazepam  (ATIVAN) 0.5 MG tablet Take 0.5 mg by mouth every 8 (eight) hours.    . metoprolol tartrate (LOPRESSOR) 25 MG tablet Take 25 mg by mouth 2 (two) times daily.     . Multiple Vitamins-Iron (MULTIVITAMINS WITH IRON) TABS Take 1 tablet by mouth daily.    . Oxycodone HCl 10 MG TABS Take 1 tablet (10 mg total) by mouth every 4 (four) hours as needed (Must last 30 days.). pain 170 tablet 0  . oxyCODONE-acetaminophen (PERCOCET) 10-325 MG tablet One every four hours as needed for pain.  Must last 30 days. 180 tablet 0  . polyethylene glycol (MIRALAX / GLYCOLAX) packet Take 17 g by mouth 2 (two) times daily before a meal.      . pregabalin (LYRICA) 100 MG capsule Take 100 mg by mouth 3 (three) times daily.      . sucralfate (CARAFATE) 1 g tablet TAKE 1 TABLET BY MOUTH FOUR TIMES DAILY WITH MEALS AND AT BEDTIME 120 tablet 2  . tiZANidine (ZANAFLEX) 4 MG capsule Take 4 mg by mouth every 6 (six) hours.     . topiramate (TOPAMAX) 100 MG tablet Take 100 mg by mouth 2 (two) times daily.      . traMADol (ULTRAM) 50 MG tablet Take 50 mg by mouth at bedtime.     Marland Kitchen  traZODone (DESYREL) 50 MG tablet Take 50 mg by mouth at bedtime.    . VENTOLIN HFA 108 (90 Base) MCG/ACT inhaler Inhale 1-2 puffs into the lungs as needed.     . zolpidem (AMBIEN) 10 MG tablet Take 10 mg by mouth at bedtime.      No current facility-administered medications for this visit.      Past Surgical History:  Procedure Laterality Date  . BACK SURGERY    . BREAST BIOPSY    . CHOLECYSTECTOMY    . HERNIA REPAIR    . NASAL SEPTUM SURGERY    . NECK SURGERY     2 yrs ago  . TOTAL ABDOMINAL HYSTERECTOMY     Left oophroectomy for a lare tumor about 21 yrs. RT ovary removed time of hysterectomy     Allergies  Allergen Reactions  . Cephalexin Hives    unknown  . Latex Rash  . Betadine [Povidone Iodine] Rash  . Cleocin [Clindamycin Hcl] Hives  . Sulfa Antibiotics     Vomiting       No family history on file.   Social  History Ms. Buchmann reports that she has been smoking.  She has a 17.50 pack-year smoking history. she has never used smokeless tobacco. Ms. Sippel reports that she does not drink alcohol.   Review of Systems CONSTITUTIONAL: No weight loss, fever, chills, weakness or fatigue.  HEENT: Eyes: No visual loss, blurred vision, double vision or yellow sclerae.No hearing loss, sneezing, congestion, runny nose or sore throat.  SKIN: No rash or itching.  CARDIOVASCULAR: per hpi RESPIRATORY: per hpi GASTROINTESTINAL: No anorexia, nausea, vomiting or diarrhea. No abdominal pain or blood.  GENITOURINARY: No burning on urination, no polyuria NEUROLOGICAL: No headache, dizziness, syncope, paralysis, ataxia, numbness or tingling in the extremities. No change in bowel or bladder control.  MUSCULOSKELETAL: No muscle, back pain, joint pain or stiffness.  LYMPHATICS: No enlarged nodes. No history of splenectomy.  PSYCHIATRIC: No history of depression or anxiety.  ENDOCRINOLOGIC: No reports of sweating, cold or heat intolerance. No polyuria or polydipsia.  Marland Kitchen   Physical Examination There were no vitals filed for this visit. There were no vitals filed for this visit.  Gen: resting comfortably, no acute distress HEENT: no scleral icterus, pupils equal round and reactive, no palptable cervical adenopathy,  CV Resp: Clear to auscultation bilaterally GI: abdomen is soft, non-tender, non-distended, normal bowel sounds, no hepatosplenomegaly MSK: extremities are warm, no edema.  Skin: warm, no rash Neuro:  no focal deficits Psych: appropriate affect   Diagnostic Studies  10/2016 echo Study Conclusions  - Left ventricle: The cavity size was normal. Wall thickness was   normal. Systolic function was normal. The estimated ejection   fraction was in the range of 60% to 65%. Wall motion was normal;   there were no regional wall motion abnormalities. Left   ventricular diastolic function parameters were  normal. - Mitral valve: There was trivial regurgitation. - Right atrium: Central venous pressure (est): 8 mm Hg. - Atrial septum: No defect or patent foramen ovale was identified. - Tricuspid valve: There was trivial regurgitation. - Pulmonary arteries: PA peak pressure: 15 mm Hg (S). - Pericardium, extracardiac: There was no pericardial effusion.  Impressions:  - Normal LV wall thickness with LVEF 60-65% and normal diastolic   function. Trivial mitral and tricuspid regurgitation. Normal   estimated PASP 15 mmHg.     Assessment and Plan   1. Chest pain - multiple CAD risk factors - ongoing  chest pain symptoms - we will obtain a stress test     [name], M.D.

## 2017-02-13 NOTE — Patient Instructions (Signed)
Medication Instructions:  Continue all current medications.  Labwork: none  Testing/Procedures: Your physician has requested that you have a lexiscan myoview. For further information please visit [email or web address]. Please follow instruction sheet, as given.  Office will contact with results via phone or letter.    Follow-Up: Pending test results   Any Other Special Instructions Will Be Listed Below (If Applicable).  If you need a refill on your cardiac medications before your next appointment, please call your pharmacy.  

## 2017-02-14 ENCOUNTER — Ambulatory Visit (INDEPENDENT_AMBULATORY_CARE_PROVIDER_SITE_OTHER): Payer: Medicare Other | Admitting: Orthopaedic Surgery

## 2017-02-14 ENCOUNTER — Encounter: Payer: Self-pay | Admitting: Orthopaedic Surgery

## 2017-02-14 DIAGNOSIS — M25512 Pain in left shoulder: Secondary | ICD-10-CM | POA: Diagnosis not present

## 2017-02-14 DIAGNOSIS — F17210 Nicotine dependence, cigarettes, uncomplicated: Secondary | ICD-10-CM

## 2017-02-14 DIAGNOSIS — G8929 Other chronic pain: Secondary | ICD-10-CM

## 2017-02-14 DIAGNOSIS — G894 Chronic pain syndrome: Secondary | ICD-10-CM

## 2017-02-14 MED ORDER — OXYCODONE HCL 10 MG PO TABS: 10.0000 mg | ORAL_TABLET | ORAL | 0 refills | Status: DC | PRN

## 2017-02-14 NOTE — Progress Notes (Signed)
PROCEDURE NOTE:  The patient request injection, verbal consent was obtained.  The left shoulder was prepped appropriately after time out was performed.   Sterile technique was observed and injection of 1 cc of Depo-Medrol 40 mg with several cc's of plain xylocaine. Anesthesia was provided by ethyl chloride and a 20-gauge needle was used to inject the shoulder area. A posterior approach was used.  The injection was tolerated well.  A band aid dressing was applied.  The patient was advised to apply ice later today and tomorrow to the injection sight as needed.  I have reviewed the [place] web site prior to prescribing narcotic medicine for this patient.  Return in one month.  Electronically Signed Sanjuana Kava, MD 11/15/201811:15 AM

## 2017-02-15 ENCOUNTER — Ambulatory Visit: Payer: Medicare Other | Admitting: Cardiology

## 2017-02-17 ENCOUNTER — Encounter: Payer: Self-pay | Admitting: Cardiology

## 2017-02-19 ENCOUNTER — Telehealth: Payer: Self-pay | Admitting: Cardiology

## 2017-02-19 NOTE — Telephone Encounter (Signed)
Pre-cert Verification for the following procedure   Lexiscan scheduled for 02-26-17

## 2017-02-26 ENCOUNTER — Other Ambulatory Visit (HOSPITAL_COMMUNITY): Payer: Medicare Other

## 2017-02-26 ENCOUNTER — Encounter (HOSPITAL_COMMUNITY): Payer: Medicare Other

## 2017-02-28 ENCOUNTER — Telehealth: Payer: Self-pay | Admitting: Orthopaedic Surgery

## 2017-02-28 NOTE — Telephone Encounter (Signed)
Spoke with patient; provided contact information for [facility] Billing.

## 2017-03-07 ENCOUNTER — Telehealth: Payer: Self-pay | Admitting: Orthopaedic Surgery

## 2017-03-07 MED ORDER — OXYCODONE-ACETAMINOPHEN 10-325 MG PO TABS: ORAL_TABLET | ORAL | 0 refills | Status: DC

## 2017-03-07 NOTE — Telephone Encounter (Signed)
Oxycodone-Acetaminophen 10/325 mg  Qty 180 Tablets

## 2017-03-14 ENCOUNTER — Encounter: Payer: Self-pay | Admitting: Orthopaedic Surgery

## 2017-03-14 ENCOUNTER — Ambulatory Visit (INDEPENDENT_AMBULATORY_CARE_PROVIDER_SITE_OTHER): Payer: Medicare Other | Admitting: Orthopaedic Surgery

## 2017-03-14 DIAGNOSIS — M25512 Pain in left shoulder: Secondary | ICD-10-CM | POA: Diagnosis not present

## 2017-03-14 DIAGNOSIS — G8929 Other chronic pain: Secondary | ICD-10-CM

## 2017-03-14 DIAGNOSIS — G894 Chronic pain syndrome: Secondary | ICD-10-CM

## 2017-03-14 MED ORDER — OXYCODONE HCL 10 MG PO TABS
10.0000 mg | ORAL_TABLET | ORAL | 0 refills | Status: DC | PRN
Start: 2017-03-14 — End: 2017-04-23

## 2017-03-14 NOTE — Progress Notes (Signed)
PROCEDURE NOTE:  The patient request injection, verbal consent was obtained.  The left shoulder was prepped appropriately after time out was performed.   Sterile technique was observed and injection of 1 cc of Depo-Medrol 40 mg with several cc's of plain xylocaine. Anesthesia was provided by ethyl chloride and a 20-gauge needle was used to inject the shoulder area. A posterior approach was used.  The injection was tolerated well.  A band aid dressing was applied.  The patient was advised to apply ice later today and tomorrow to the injection sight as needed.  I have reviewed the [place] web site prior to prescribing narcotic medicine for this patient.  Electronically Signed Sanjuana Kava, MD 12/13/201811:20 AM

## 2017-03-14 NOTE — Progress Notes (Deleted)
Patient XL:[id number] [name], female DOB:1960-05-23, 56 y.o. VOZ:[id number]  Chief Complaint  Patient presents with  . Shoulder Pain    left    HPI  [name] is a 56 y.o. female *** HPI  There is no height or weight on file to calculate BMI.  ROS  Review of Systems    Past Surgical History:  Procedure Laterality Date  . AMPUTATION  12/13/2011   Procedure: AMPUTATION DIGIT;  Surgeon: Marcheta Grammes, DPM;  Location: [facility];  Service: Orthopedics;  Laterality: Right;  amputation second toe right foot  . BACK SURGERY    . BIOPSY  12/23/2015   Procedure: BIOPSY;  Surgeon: Rogene Houston, MD;  Location: [facility];  Service: Endoscopy;;  pre pyloric patches  . BREAST BIOPSY    . CHOLECYSTECTOMY    . ESOPHAGOGASTRODUODENOSCOPY  05/28/2011   Procedure: ESOPHAGOGASTRODUODENOSCOPY (EGD);  Surgeon: Rogene Houston, MD;  Location: [facility];  Service: Endoscopy;  Laterality: N/A;  730  . ESOPHAGOGASTRODUODENOSCOPY N/A 10/09/2013   Procedure: ESOPHAGOGASTRODUODENOSCOPY (EGD);  Surgeon: Rogene Houston, MD;  Location: [facility];  Service: Endoscopy;  Laterality: N/A;  730  . ESOPHAGOGASTRODUODENOSCOPY (EGD) WITH PROPOFOL N/A 12/23/2015   Procedure: ESOPHAGOGASTRODUODENOSCOPY (EGD) WITH PROPOFOL;  Surgeon: Rogene Houston, MD;  Location: [facility];  Service: Endoscopy;  Laterality: N/A;  . HERNIA REPAIR    . MALONEY DILATION N/A 10/09/2013   Procedure: MALONEY DILATION;  Surgeon: Rogene Houston, MD;  Location: [facility];  Service: Endoscopy;  Laterality: N/A;  . NASAL SEPTUM SURGERY    . NECK SURGERY     2 yrs ago  . TOTAL ABDOMINAL HYSTERECTOMY     Left oophroectomy for a lare tumor about 21 yrs. RT ovary removed time of hysterectomy    History reviewed. No pertinent family history.  Social History Social History   Tobacco Use  . Smoking status: Current Every Day Smoker    Packs/day: 0.50    Years: 35.00    Pack years: 17.50  . Smokeless tobacco:  Never Used  . Tobacco comment: 1 pack a day since age18  Substance Use Topics  . Alcohol use: No  . Drug use: No    Allergies  Allergen Reactions  . Cephalexin Hives    unknown  . Latex Rash  . Betadine [Povidone Iodine] Rash  . Cleocin [Clindamycin Hcl] Hives  . Sulfa Antibiotics     Vomiting     Current Outpatient Medications  Medication Sig Dispense Refill  . beta carotene w/minerals (OCUVITE) tablet Take 1 tablet by mouth daily.      Marland Kitchen esomeprazole (NEXIUM) 40 MG capsule Take 1 capsule (40 mg total) by mouth daily before breakfast.    . fentaNYL (DURAGESIC - DOSED MCG/HR) 100 MCG/HR Place 1 patch onto the skin every 3 (three) days.      . furosemide (LASIX) 40 MG tablet Take 40 mg 3 (three) times daily as needed by mouth.     . levothyroxine (SYNTHROID, LEVOTHROID) 137 MCG tablet Take 137 mcg by mouth daily before breakfast.    . LORazepam (ATIVAN) 0.5 MG tablet Take 0.5 mg by mouth every 8 (eight) hours.    . metoprolol tartrate (LOPRESSOR) 25 MG tablet Take 25 mg by mouth 2 (two) times daily.     . Multiple Vitamins-Iron (MULTIVITAMINS WITH IRON) TABS Take 1 tablet by mouth daily.    . Oxycodone HCl 10 MG TABS Take 1 tablet (10 mg total)  by mouth every 4 (four) hours as needed (Must last 30 days.). pain 170 tablet 0  . oxyCODONE-acetaminophen (PERCOCET) 10-325 MG tablet Take one tablet by mouth every four hours as needed for pain.  Must last 30 days. 180 tablet 0  . polyethylene glycol (MIRALAX / GLYCOLAX) packet Take 17 g by mouth 2 (two) times daily before a meal.      . pregabalin (LYRICA) 100 MG capsule Take 100 mg by mouth 3 (three) times daily.      . sucralfate (CARAFATE) 1 g tablet TAKE 1 TABLET BY MOUTH FOUR TIMES DAILY WITH MEALS AND AT BEDTIME 120 tablet 2  . tiZANidine (ZANAFLEX) 4 MG capsule Take 4 mg by mouth every 6 (six) hours.     . topiramate (TOPAMAX) 100 MG tablet Take 100 mg by mouth 2 (two) times daily.      . traMADol (ULTRAM) 50 MG tablet Take 50 mg by  mouth at bedtime.     . traZODone (DESYREL) 50 MG tablet Take 50 mg by mouth at bedtime.    . VENTOLIN HFA 108 (90 Base) MCG/ACT inhaler Inhale 1-2 puffs into the lungs as needed.     . zolpidem (AMBIEN) 10 MG tablet Take 10 mg by mouth at bedtime.      No current facility-administered medications for this visit.      Physical Exam  There were no vitals taken for this visit.  Constitutional: overall normal hygiene, normal nutrition, well developed, normal grooming, normal body habitus. Assistive device:{devices; assistance:[id number]}  Musculoskeletal: gait and station Limp {NONE:[id number]}, muscle tone and strength are normal, no tremors or atrophy is present.  .  Neurological: coordination overall normal.  Deep tendon reflex/nerve stretch intact.  Sensation normal.  Cranial nerves II-XII intact.   Skin:   Normal overall no scars, lesions, ulcers or rashes. No psoriasis.  Psychiatric: Alert and oriented x 3.  Recent memory intact, remote memory unclear.  Normal mood and affect. Well groomed.  Good eye contact.  Cardiovascular: overall no swelling, no varicosities, no edema bilaterally, normal temperatures of the legs and arms, no clubbing, cyanosis and good capillary refill.  Lymphatic: palpation is normal.  All other systems reviewed and are negative   The patient has been educated about the nature of the problem(s) and counseled on treatment options.  The patient appeared to understand what I have discussed and is in agreement with it.  Encounter Diagnoses  Name Primary?  . Chronic left shoulder pain Yes  . Pain syndrome, chronic     PLAN Call if any problems.  Precautions discussed.  Continue current medications.   Return to clinic {TIME; 1 WEEK TO 3 MONTHS:[phone]}   ***

## 2017-04-05 ENCOUNTER — Telehealth: Payer: Self-pay | Admitting: Orthopaedic Surgery

## 2017-04-05 NOTE — Telephone Encounter (Signed)
Oxycodone-Acetaminophen 10/325 MG  Qty 180 Tablets  Patient uses [facility] in [place]

## 2017-04-08 MED ORDER — OXYCODONE-ACETAMINOPHEN 10-325 MG PO TABS: ORAL_TABLET | ORAL | 0 refills | Status: DC

## 2017-04-09 ENCOUNTER — Telehealth: Payer: Self-pay | Admitting: Orthopaedic Surgery

## 2017-04-09 NOTE — Telephone Encounter (Signed)
Joselyne requests that you call [facility] in [place] and speak to the pharmacist regarding some problem issue with filling her pain medication.  [facility]  Phone number is [phone]  Thanks

## 2017-04-10 ENCOUNTER — Other Ambulatory Visit: Payer: Self-pay | Admitting: Orthopaedic Surgery

## 2017-04-10 MED ORDER — DICLOFENAC SODIUM 1 % TD GEL: TRANSDERMAL | 5 refills | Status: DC

## 2017-04-10 NOTE — Telephone Encounter (Signed)
About what?  I do not understand.  I sent it in.  Need to be very specific.

## 2017-04-11 ENCOUNTER — Encounter: Payer: Self-pay | Admitting: Orthopaedic Surgery

## 2017-04-11 ENCOUNTER — Telehealth: Payer: Self-pay | Admitting: Radiology

## 2017-04-11 ENCOUNTER — Ambulatory Visit (INDEPENDENT_AMBULATORY_CARE_PROVIDER_SITE_OTHER): Payer: Medicare Other | Admitting: Orthopaedic Surgery

## 2017-04-11 DIAGNOSIS — G8929 Other chronic pain: Secondary | ICD-10-CM | POA: Diagnosis not present

## 2017-04-11 DIAGNOSIS — M25512 Pain in left shoulder: Secondary | ICD-10-CM

## 2017-04-11 DIAGNOSIS — G894 Chronic pain syndrome: Secondary | ICD-10-CM

## 2017-04-11 NOTE — Telephone Encounter (Signed)
Dr Luna Glasgow spoke to pharmacy about her Rx

## 2017-04-11 NOTE — Progress Notes (Signed)
PROCEDURE NOTE:  The patient request injection, verbal consent was obtained.  The left shoulder was prepped appropriately after time out was performed.   Sterile technique was observed and injection of 1 cc of Depo-Medrol 40 mg with several cc's of plain xylocaine. Anesthesia was provided by ethyl chloride and a 20-gauge needle was used to inject the shoulder area. A posterior approach was used.  The injection was tolerated well.  A band aid dressing was applied.  The patient was advised to apply ice later today and tomorrow to the injection sight as needed.  Call if any problem. Return in one month.  Precautions discussed.   Electronically Signed Sanjuana Kava, MD 1/10/20194:04 PM

## 2017-04-17 IMAGING — CT CT ABD-PELV W/ CM
2 of 5 series · 16 of 46 positions shown, 18 images · IV contrast (Isovue)
Comparison: [DATE]

CLINICAL DATA: [REDACTED] history of nausea and vomiting with mid to
lower abdominal pain.

EXAM:
CT ABDOMEN AND PELVIS WITH CONTRAST
TECHNIQUE: Multidetector CT imaging of the abdomen and pelvis was performed
using the standard protocol following bolus administration of
intravenous contrast.
CONTRAST:  100mL 0MYL42-6EE IOPAMIDOL (0MYL42-6EE) INJECTION 61%

[Series 2: axial st · axial · 0.77mm/px · z∈[-406,-31]mm · 13 of 85 slices shown, 15 images]
[im 5/85  soft-tissue]
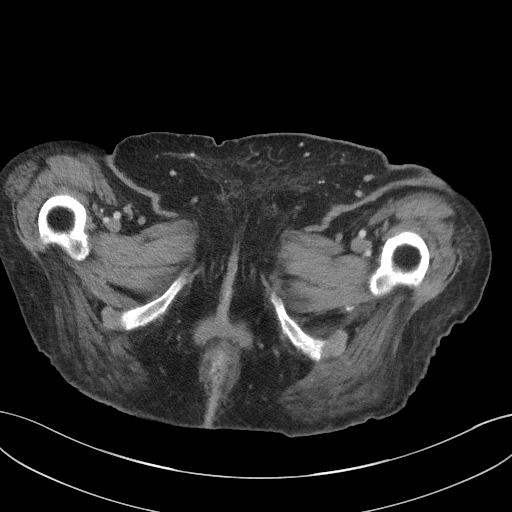
[im 5/85  bone]
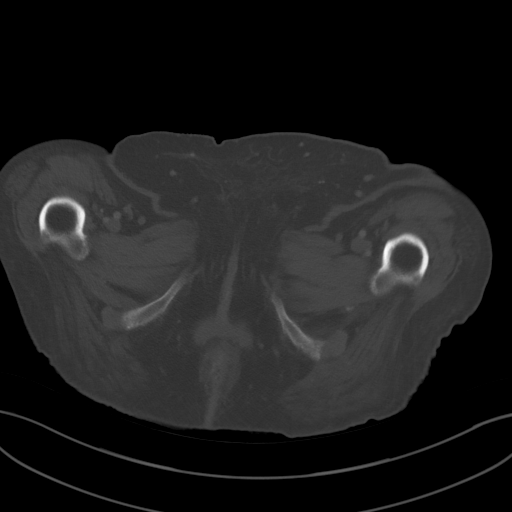
[im 10/85  soft-tissue]
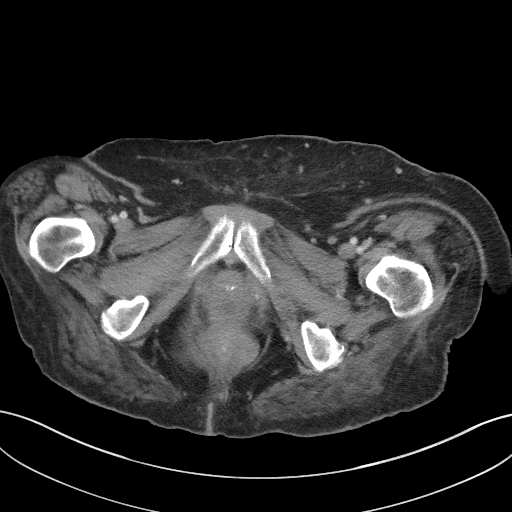
[im 19/85  soft-tissue]
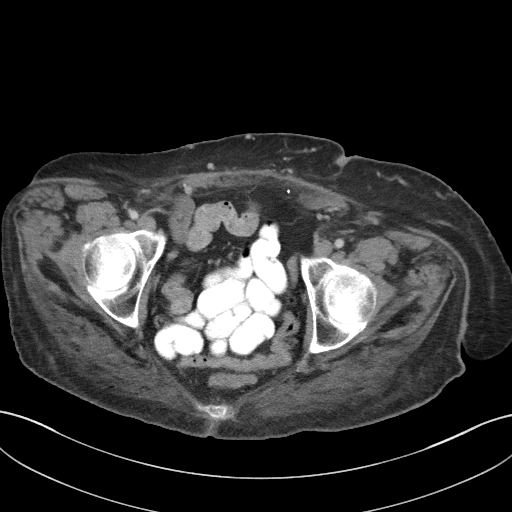
[im 24/85  soft-tissue]
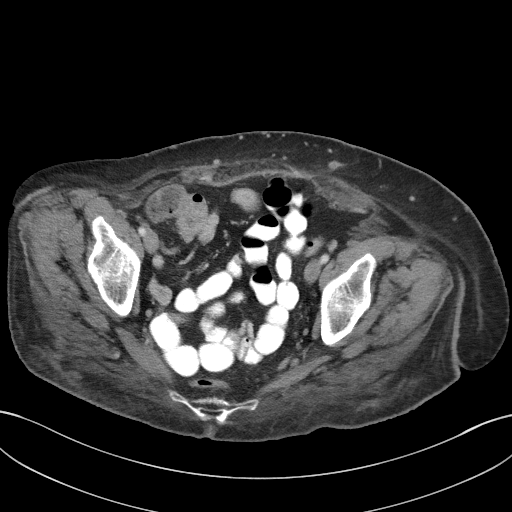
[im 29/85  soft-tissue]
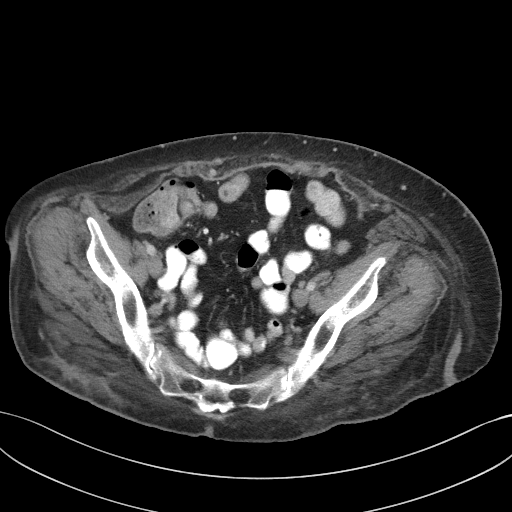
[im 38/85  soft-tissue]
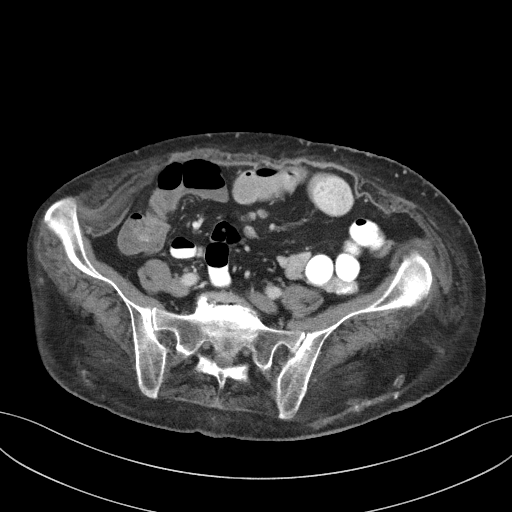
[im 43/85  soft-tissue]
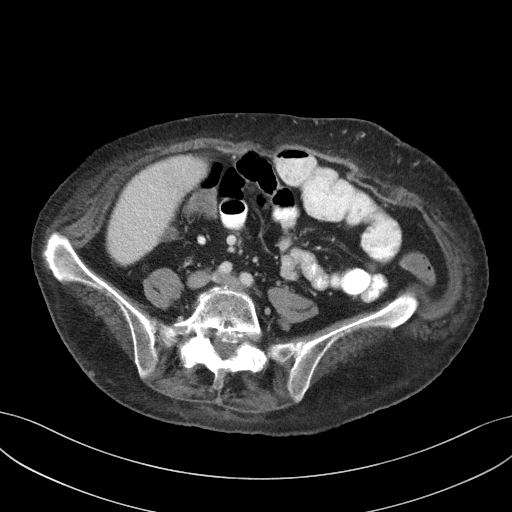
[im 47/85  soft-tissue]
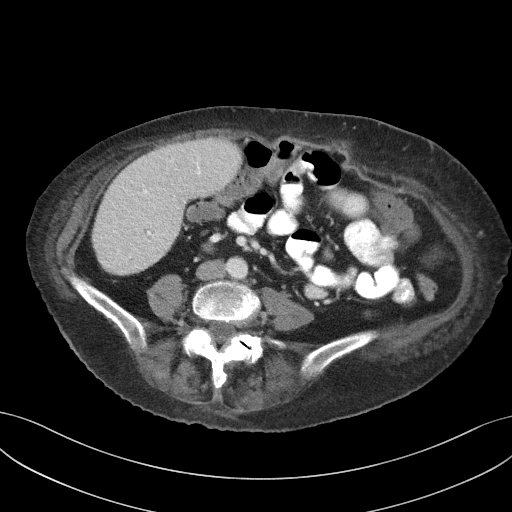
[im 57/85  soft-tissue]
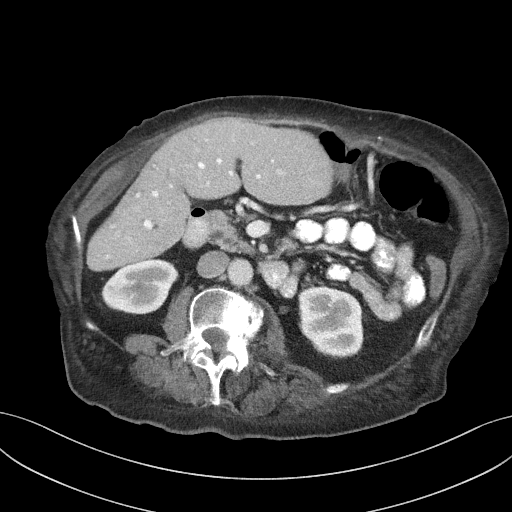
[im 57/85  bone]
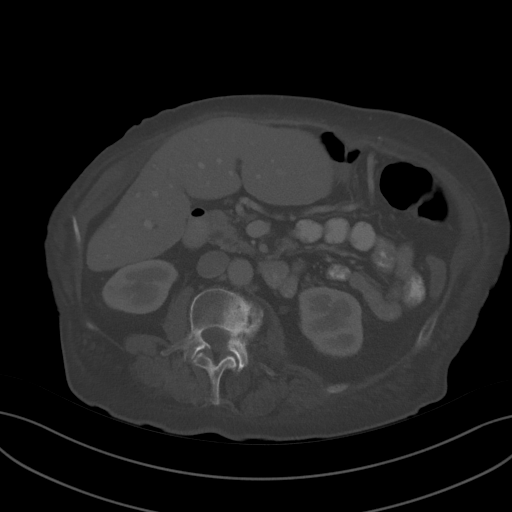
[im 61/85  soft-tissue]
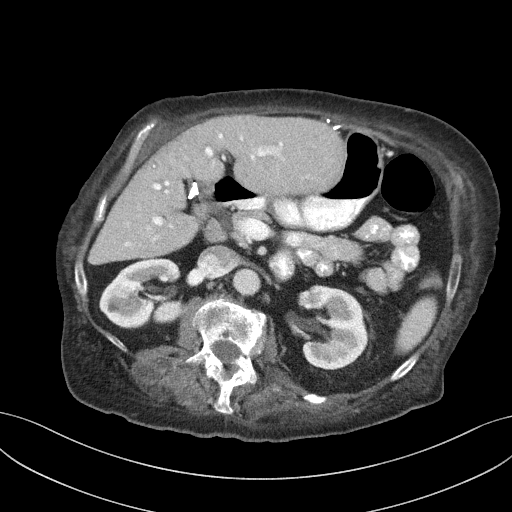
[im 66/85  soft-tissue]
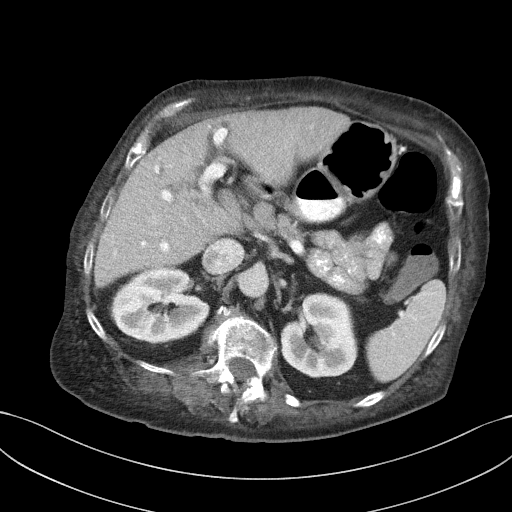
[im 75/85  soft-tissue]
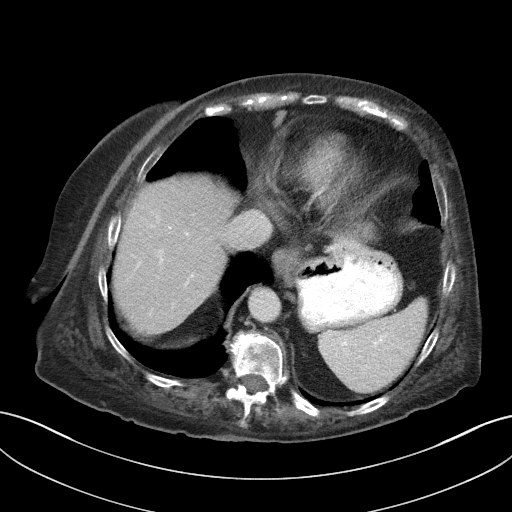
[im 80/85  soft-tissue]
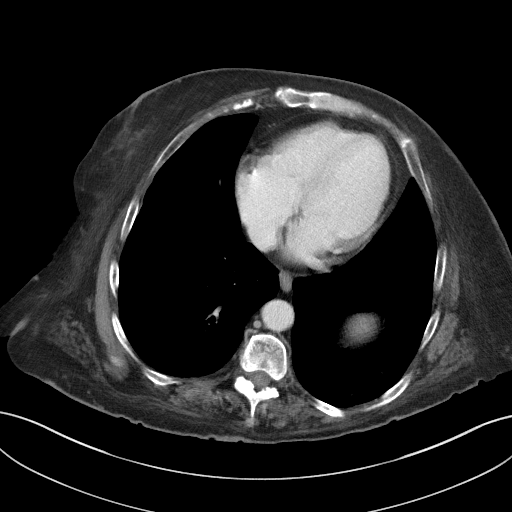

[Series 6: coronal st · coronal · 0.74mm/px · 3 of 117 slices shown]
[im 39/117  soft-tissue]
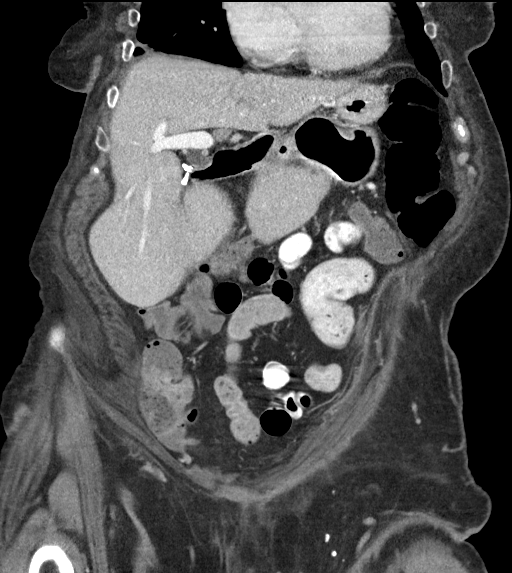
[im 52/117  soft-tissue]
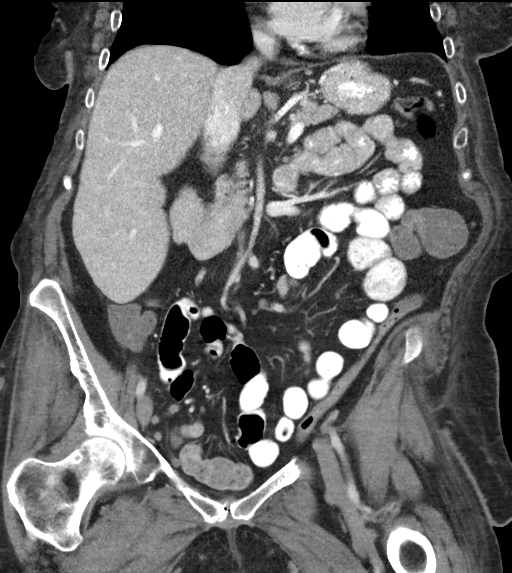
[im 65/117  soft-tissue]
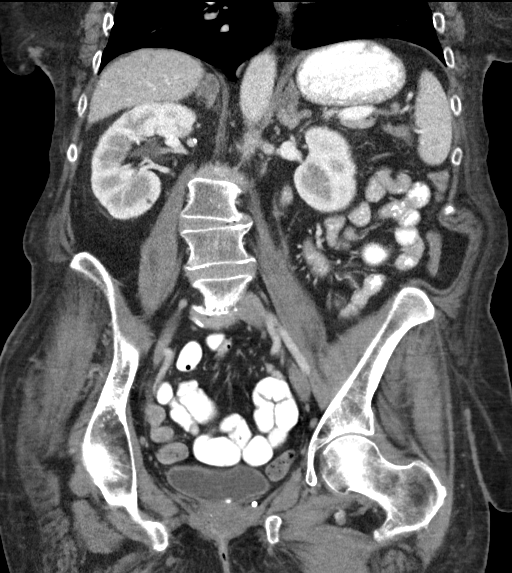

[16 of 46 positions shown; findings below may reference images not displayed]

FINDINGS: Lower chest:  Unremarkable.

Hepatobiliary: Liver measures 19.2 cm craniocaudal length, enlarged,
but stable and a component of this may be due to Riedel's lobe
anatomy. No focal abnormality within the liver parenchyma.
Gallbladder surgically absent. 8 mm common duct diameter likely
related to prior cholecystectomy.

Pancreas: No focal mass lesion. No dilatation of the main duct. No
intraparenchymal cyst. No peripancreatic edema.

Spleen: No splenomegaly. No focal mass lesion.

Adrenals/Urinary Tract: Bilateral adrenal nodules are again noted.
These are shin not substantially changed since prior study or a
noncontrast exam of 12/06/2015 when they had average attenuation
consistent with adrenal adenomas.

Areas of cortical scarring are noted in the kidneys bilaterally. 3
mm nonobstructing stone identified lower pole right kidney. Adjacent
2-3 mm stones are seen in the lower pole the left kidney without
obstruction. Small well-defined hypoattenuating lesions in the
kidneys bilaterally are compatible with cysts

Ureters are unremarkable. The urinary bladder appears normal for the
degree of distention.

Stomach/Bowel: Stomach is nondistended. No gastric wall thickening.
No evidence of outlet obstruction. Duodenum is normally positioned
as is the ligament of Treitz. No small bowel wall thickening. No
small bowel dilatation. The terminal ileum is normal. The appendix
is normal. No gross colonic mass. No colonic wall thickening. No
substantial diverticular change.

Vascular/Lymphatic: No abdominal aortic aneurysm. Stable appearance
small gastrohepatic ligament lymph nodes. Upper normal to borderline
enlarged hepatoduodenal ligament lymph node is stable since
10/20/2014, compatible with benign etiology. No retroperitoneal
lymphadenopathy. No pelvic sidewall lymphadenopathy.

Reproductive: Uterus surgically absent.  There is no adnexal mass.

Other: No intraperitoneal free fluid.

Musculoskeletal: Bone windows reveal no worrisome lytic or sclerotic
osseous lesions.
IMPRESSION: 1. No new or acute interval findings.
2. Bilateral nonobstructing renal stones with small hypodensities in
each kidney likely representing cysts.
3. Bilateral adrenal adenomas, stable.

## 2017-04-23 ENCOUNTER — Telehealth: Payer: Self-pay | Admitting: Orthopaedic Surgery

## 2017-04-23 NOTE — Telephone Encounter (Signed)
Patient requests refill on Oxycodone HCI 10 mgs.  Qty  170  Sig: Take 1 tablet (10 mg total) by mouth every 4 (four) hours as needed for pain (Must last 30 days.).  She requests that this prescriptions be sent in to [facility] in [place].

## 2017-04-24 MED ORDER — OXYCODONE HCL 10 MG PO TABS: ORAL_TABLET | ORAL | 0 refills | Status: DC

## 2017-05-09 ENCOUNTER — Ambulatory Visit (INDEPENDENT_AMBULATORY_CARE_PROVIDER_SITE_OTHER): Payer: Medicare Other | Admitting: Orthopaedic Surgery

## 2017-05-09 ENCOUNTER — Encounter: Payer: Self-pay | Admitting: Orthopaedic Surgery

## 2017-05-09 DIAGNOSIS — G8929 Other chronic pain: Secondary | ICD-10-CM | POA: Diagnosis not present

## 2017-05-09 DIAGNOSIS — M25512 Pain in left shoulder: Secondary | ICD-10-CM | POA: Diagnosis not present

## 2017-05-09 MED ORDER — OXYCODONE-ACETAMINOPHEN 10-325 MG PO TABS: ORAL_TABLET | ORAL | 0 refills | Status: DC

## 2017-05-09 NOTE — Progress Notes (Signed)
.  PROCEDURE NOTE:  The patient request injection, verbal consent was obtained.  The left shoulder was prepped appropriately after time out was performed.   Sterile technique was observed and injection of 1 cc of Depo-Medrol 40 mg with several cc's of plain xylocaine. Anesthesia was provided by ethyl chloride and a 20-gauge needle was used to inject the shoulder area. A posterior approach was used.  The injection was tolerated well.  A band aid dressing was applied.  The patient was advised to apply ice later today and tomorrow to the injection sight as needed.  I have reviewed the [place] web site prior to prescribing narcotic medicine for this patient.  Electronically Signed Sanjuana Kava, MD 2/7/201911:25 AM

## 2017-05-22 ENCOUNTER — Telehealth: Payer: Self-pay | Admitting: Orthopaedic Surgery

## 2017-05-22 MED ORDER — OXYCODONE HCL 10 MG PO TABS: ORAL_TABLET | ORAL | 0 refills | Status: DC

## 2017-05-22 NOTE — Telephone Encounter (Signed)
Oxycodone HCI  10 MG Qty  170 Tablets  Patient states it due on the 22nd.  PATIENT USES [facility]

## 2017-05-22 NOTE — Telephone Encounter (Signed)
Oxycodone HCI 10 MG  Qty 170 Tablets  PATIENT USES [facility]

## 2017-05-23 DIAGNOSIS — K640 First degree hemorrhoids: Secondary | ICD-10-CM | POA: Insufficient documentation

## 2017-05-23 MED ORDER — OXYCODONE HCL 10 MG PO TABS: ORAL_TABLET | ORAL | 0 refills | Status: DC

## 2017-06-03 ENCOUNTER — Other Ambulatory Visit: Payer: Self-pay | Admitting: Orthopedic Surgery

## 2017-06-03 NOTE — Telephone Encounter (Signed)
Patient of Dr. Brooke Bonito requests refill on Oxycodone/Acetaminophen (Percocet)  Qty  180  Sig: Take one tablet by mouth every four hours as needed for pain. Must last 28 days.   Patient states she uses [facility]

## 2017-06-04 ENCOUNTER — Other Ambulatory Visit (HOSPITAL_COMMUNITY): Payer: Self-pay | Admitting: Neurosurgery

## 2017-06-04 DIAGNOSIS — R208 Other disturbances of skin sensation: Secondary | ICD-10-CM | POA: Insufficient documentation

## 2017-06-04 DIAGNOSIS — T402X5A Adverse effect of other opioids, initial encounter: Secondary | ICD-10-CM | POA: Insufficient documentation

## 2017-06-04 DIAGNOSIS — Z780 Asymptomatic menopausal state: Secondary | ICD-10-CM

## 2017-06-04 MED ORDER — OXYCODONE-ACETAMINOPHEN 10-325 MG PO TABS: ORAL_TABLET | ORAL | 0 refills | Status: DC

## 2017-06-10 ENCOUNTER — Ambulatory Visit (HOSPITAL_COMMUNITY)
Admission: RE | Admit: 2017-06-10 | Discharge: 2017-06-10 | Disposition: A | Discharge status: Home / Self Care | Payer: Medicare Other | Source: Ambulatory Visit | Attending: Neurosurgery | Admitting: Neurosurgery

## 2017-06-10 DIAGNOSIS — Z780 Asymptomatic menopausal state: Secondary | ICD-10-CM | POA: Diagnosis present

## 2017-06-24 ENCOUNTER — Other Ambulatory Visit: Payer: Self-pay | Admitting: Orthopaedic Surgery

## 2017-06-24 NOTE — Telephone Encounter (Signed)
Patient of Dr Brooke Bonito requests refill Oxycodone 10 mgs.  Qty  170  Sig: Take one tablet by mouth every four hours as needed for pain. Must last 28 days.  Patient states she needs refill sent to [facility]

## 2017-06-25 ENCOUNTER — Other Ambulatory Visit: Payer: Self-pay | Admitting: Orthopaedic Surgery

## 2017-06-25 ENCOUNTER — Telehealth: Payer: Self-pay | Admitting: Radiology

## 2017-06-25 ENCOUNTER — Encounter: Payer: Self-pay | Admitting: Orthopedic Surgery

## 2017-06-25 NOTE — Telephone Encounter (Signed)
You denied the oxycodone, Dr Luna Glasgow is giving her Oxycodone 10mg  every 4 hours AND Oxycodone with Tylenol every 4 hours  Can you review ?

## 2017-06-25 NOTE — Telephone Encounter (Signed)
Due April 9

## 2017-06-25 NOTE — Telephone Encounter (Signed)
Yes and the answer is no

## 2017-06-25 NOTE — Progress Notes (Signed)
This patient called for refill on Endocet 01/03/2024 on March 10 she got 180 tablets which should last 30 days she is also on lorazepam 1 mg tablet for 30 days 90 tablets she also got 150 mcg of fentanyl to last 30 days which was given on March 7  I am not sure what is going on with this pain management schedule but her oxycodone is for 30 days on February 21 and then another 30 days on March 10 they are rolling prescriptions and I do not feel comfortable continuing this regimen  The databank list as below  Fill Date ID Written Drug Qty Days Prescriber Rx # Pharmacy Refill Daily Dose * Pymt Type PMP 06/18/2017 1 12/24/2016 Lyrica 100 Mg Capsule 90 30 Da Tap [id number] [facility] (0252) 3 2.01 LME Medicare [place] 06/14/2017 1 06/13/2017 Zolpidem Tartrate 10 Mg Tablet 30 30 [place] [id number] [facility] (0252) 0 0.50 LME Medicare [place] 06/09/2017 1 06/04/2017 Endocet 10-325 Mg Tablet 180 30 [place] [id number] [facility] (0303) 0 90.00 MME Comm Ins [place] 06/06/2017 1 05/03/2017 Lorazepam 1 Mg Tablet 90 30 Da Tap [id number] [facility] (0252) 1 3.00 LME Medicare [place] 06/06/2017 1 05/27/2017 Fentanyl 50 Mcg/hr Patch 10 30 Da Tap [id number] [facility] (0303) 0 120.00 MME Comm Ins [place] 06/06/2017 1 05/24/2017 Fentanyl 100 Mcg/hr Patch 10 30 Da Tap [id number] [facility] (0303) 0 240.00 MME Comm Ins [place] 05/23/2017 1 05/23/2017 Oxycodone Hcl 10 Mg Tablet 170 29 [place] [id number] [facility] (0252) 0 87.93 MME Medicare [place] 05/18/2017 1 03/21/2017 Zolpidem Tartrate 10 Mg Tablet 30 30 Da Tap [id number] [facility] (0252) 2 0.50 LME Medicare [place] 05/18/2017 1 12/24/2016 Lyrica 100 Mg Capsule 90 30 Da Tap [id number] [facility] (0252) 2 2.01 LME Medicare [place] 05/12/2017 1 05/09/2017 Endocet 10-325 Mg Tablet 180 30 [place] [id number] [facility] (0303) 0 90.00 MME Comm Ins [place] 05/10/2017 1 05/03/2017 Lorazepam 1 Mg Tablet 90 30 Da Tap [id number] [facility] (0252) 0 3.00 LME Medicare [place] 05/09/2017 1 04/30/2017 Fentanyl 100 Mcg/hr Patch 10 30 Da Tap [id number] [facility] (0303) 0 240.00 MME Medicare [place] 05/09/2017 1 04/30/2017 Fentanyl 50 Mcg/hr Patch 10 30 Da Tap [id number] [facility] (0303) 0  120.00 MME Medicare [place] 04/24/2017 1 04/24/2017 Oxycodone Hcl 10 Mg Tablet 170 29 J [place] [id number] [facility] (0303) 0 87.93 MME Medicare [place] 04/19/2017 1 12/24/2016 Lyrica 100 Mg Capsule 90 30 Da Tap [id number] [facility] (0252) 1 2.01 LME Medicare 

## 2017-06-25 NOTE — Telephone Encounter (Signed)
She has also gotten Fentanyl in March I called her, Dr Aline Brochure can not prescribe medications for her.   She is using Fentanyl/ Oxycodone 10 and Percocet 10. This is too much    I called patient to advise, and she sounded like she was asleep.   I have told her to use the Fentanyl only

## 2017-06-28 ENCOUNTER — Telehealth: Payer: Self-pay | Admitting: Orthopedic Surgery

## 2017-06-28 NOTE — Telephone Encounter (Signed)
Patient called and  still has questions regarding medication.  Will you please give her a call?  Thanks

## 2017-06-28 NOTE — Telephone Encounter (Signed)
Dr Aline Brochure has also discussed with her, if she has withdrawal symptoms she is to go to the Emergency room  He will not prescribe the medication any longer.

## 2017-06-28 NOTE — Telephone Encounter (Signed)
I called her again, same conversation as previous. Convesation same as previous

## 2017-07-17 ENCOUNTER — Encounter (INDEPENDENT_AMBULATORY_CARE_PROVIDER_SITE_OTHER): Payer: Self-pay | Admitting: Internal Medicine

## 2017-07-17 ENCOUNTER — Ambulatory Visit (INDEPENDENT_AMBULATORY_CARE_PROVIDER_SITE_OTHER): Payer: Medicare Other | Admitting: Internal Medicine

## 2017-07-17 VITALS — BP 130/80 | HR 72 | Temp 98.2°F | Ht 60.0 in | Wt 144.5 lb

## 2017-07-17 DIAGNOSIS — K858 Other acute pancreatitis without necrosis or infection: Secondary | ICD-10-CM | POA: Diagnosis not present

## 2017-07-17 NOTE — Progress Notes (Addendum)
Subjective:    Patient ID: [name], female    DOB: Aug 30, 1960, 57 y.o.   MRN: [id number]  HPI Seen in [facility] ED 07/08/2017. She had pain in her RUQ. She says someone has a Electrical engineer in her side.  She says she is incontinent of stool. She is upset that all her pain medications have been discontinued.  She has had some weight loss.  07/02/2017 HA1C 5.0  07/08/2017 WBC 6.0, H and  h 11.5 AND 37.5 Lipase 106, Amylase 43 Seen in the ED with rt upper quadrant abdominal pain and weight loss.  She is a smoker. Her last weight in May of 2018 was 161. Today her weight ls 144.5. HEr appetite is not good.  She eats one meal a day and sometimes she will skip for 3 days. She is having a BM daily and sometimes she will have 6-7 stools. It feels like hot lava. She has not taken anything for diarrhea. She is taking Miralax daily.  She says if she stops the MIralax she becomes constipated. She is upset that all her pain medications have been stopped.  She has chronic pain.  Ct scan 07/08/2017 revealed no acute abnormality.    07/09/2017 Abdomen 2 views: Non obstructive bowel gas pattern with no radiographic evidence for acute intra abdominal pathology.  Review of Systems Past Medical History:  Diagnosis Date  . Back pain   . Depression   . GERD (gastroesophageal reflux disease)   . Hypertension    diet control  . Hypothyroid   . IBS (irritable bowel syndrome)   . Migraines   . Obesity   . Osteoarthritis   . Sleep apnea     Past Surgical History:  Procedure Laterality Date  . AMPUTATION  12/13/2011   Procedure: AMPUTATION DIGIT;  Surgeon: Marcheta Grammes, DPM;  Location: [facility];  Service: Orthopedics;  Laterality: Right;  amputation second toe right foot  . BACK SURGERY    . BIOPSY  12/23/2015   Procedure: BIOPSY;  Surgeon: Rogene Houston, MD;  Location: [facility];  Service: Endoscopy;;  pre pyloric patches  . BREAST BIOPSY    . CHOLECYSTECTOMY    . ESOPHAGOGASTRODUODENOSCOPY   05/28/2011   Procedure: ESOPHAGOGASTRODUODENOSCOPY (EGD);  Surgeon: Rogene Houston, MD;  Location: [facility];  Service: Endoscopy;  Laterality: N/A;  730  . ESOPHAGOGASTRODUODENOSCOPY N/A 10/09/2013   Procedure: ESOPHAGOGASTRODUODENOSCOPY (EGD);  Surgeon: Rogene Houston, MD;  Location: [facility];  Service: Endoscopy;  Laterality: N/A;  730  . ESOPHAGOGASTRODUODENOSCOPY (EGD) WITH PROPOFOL N/A 12/23/2015   Procedure: ESOPHAGOGASTRODUODENOSCOPY (EGD) WITH PROPOFOL;  Surgeon: Rogene Houston, MD;  Location: [facility];  Service: Endoscopy;  Laterality: N/A;  . HERNIA REPAIR    . MALONEY DILATION N/A 10/09/2013   Procedure: MALONEY DILATION;  Surgeon: Rogene Houston, MD;  Location: [facility];  Service: Endoscopy;  Laterality: N/A;  . NASAL SEPTUM SURGERY    . NECK SURGERY     2 yrs ago  . TOTAL ABDOMINAL HYSTERECTOMY     Left oophroectomy for a lare tumor about 21 yrs. RT ovary removed time of hysterectomy    Allergies  Allergen Reactions  . Cephalexin Hives    unknown  . Latex Rash  . Betadine [Povidone Iodine] Rash  . Cleocin [Clindamycin Hcl] Hives  . Sulfa Antibiotics     Vomiting     Current Outpatient Medications on File Prior to Visit  Medication Sig Dispense Refill  .  beta carotene w/minerals (OCUVITE) tablet Take 1 tablet by mouth daily.      . diclofenac sodium (VOLTAREN) 1 % GEL Apply topically 4 grams to affected joint as needed four times a day. 5 Tube 5  . esomeprazole (NEXIUM) 40 MG capsule Take 1 capsule (40 mg total) by mouth daily before breakfast.    . levothyroxine (SYNTHROID, LEVOTHROID) 137 MCG tablet Take 112 mcg by mouth daily before breakfast.     . LORazepam (ATIVAN) 0.5 MG tablet Take 0.5 mg by mouth every 8 (eight) hours.    . Multiple Vitamins-Iron (MULTIVITAMINS WITH IRON) TABS Take 1 tablet by mouth daily.    . polyethylene glycol (MIRALAX / GLYCOLAX) packet Take 17 g by mouth 2 (two) times daily before a meal.      . pregabalin (LYRICA)  100 MG capsule Take 100 mg by mouth 3 (three) times daily.      . sucralfate (CARAFATE) 1 g tablet TAKE 1 TABLET BY MOUTH FOUR TIMES DAILY WITH MEALS AND AT BEDTIME 120 tablet 2  . topiramate (TOPAMAX) 100 MG tablet Take 100 mg by mouth 2 (two) times daily.      . VENTOLIN HFA 108 (90 Base) MCG/ACT inhaler Inhale 1-2 puffs into the lungs as needed.     . zolpidem (AMBIEN) 10 MG tablet Take 10 mg by mouth at bedtime.      No current facility-administered medications on file prior to visit.         Objective:   Physical Exam Blood pressure 130/80, pulse 72, temperature 98.2 F (36.8 C), height 5' (1.524 m), weight 144 lb 8 oz (65.5 kg). Alert and oriented. Skin warm and dry. Oral mucosa is moist.   . Sclera anicteric, conjunctivae is pink. Thyroid not enlarged. No cervical lymphadenopathy. Lungs clear. Heart regular rate and rhythm.  Abdomen is soft. Bowel sounds are positive. No hepatomegaly. No abdominal masses felt. No tenderness.  No edema to lower extremities  Stool brown and guaiac negative.          Assessment & Plan:  Pancreatitis. Am going to get a CBC, amylase, Lipase and Hepatic Weight loss due to decrease in caloric intake.  Diarrhea: Taken Imodium daily.

## 2017-07-18 LAB — CBC WITH DIFFERENTIAL/PLATELET
Basophils Absolute: 62 cells/uL (ref 0–200)
Basophils Relative: 1.1 %
Eosinophils Absolute: 330 cells/uL (ref 15–500)
Eosinophils Relative: 5.9 %
HCT: 38.3 % (ref 35.0–45.0)
HEMOGLOBIN: 13.1 g/dL (ref 11.7–15.5)
Lymphs Abs: 2677 cells/uL (ref 850–3900)
MCH: 31 pg (ref 27.0–33.0)
MCHC: 34.2 g/dL (ref 32.0–36.0)
MCV: 90.5 fL (ref 80.0–100.0)
MONOS PCT: 5.9 %
MPV: 12.7 fL — ABNORMAL HIGH (ref 7.5–12.5)
NEUTROS ABS: 2201 {cells}/uL (ref 1500–7800)
Neutrophils Relative %: 39.3 %
PLATELETS: 192 10*3/uL (ref 140–400)
RBC: 4.23 10*6/uL (ref 3.80–5.10)
RDW: 13.4 % (ref 11.0–15.0)
TOTAL LYMPHOCYTE: 47.8 %
WBC: 5.6 10*3/uL (ref 3.8–10.8)
WBCMIX: 330 {cells}/uL (ref 200–950)

## 2017-07-18 LAB — AMYLASE: AMYLASE: 39 U/L (ref 21–101)

## 2017-07-18 LAB — HEPATIC FUNCTION PANEL
AG Ratio: 1 (calc) (ref 1.0–2.5)
ALBUMIN MSPROF: 3.2 g/dL — AB (ref 3.6–5.1)
ALT: 10 U/L (ref 6–29)
AST: 17 U/L (ref 10–35)
Alkaline phosphatase (APISO): 48 U/L (ref 33–130)
Bilirubin, Direct: 0.1 mg/dL (ref 0.0–0.2)
GLOBULIN: 3.2 g/dL (ref 1.9–3.7)
Indirect Bilirubin: 0.2 mg/dL (calc) (ref 0.2–1.2)
Total Bilirubin: 0.3 mg/dL (ref 0.2–1.2)
Total Protein: 6.4 g/dL (ref 6.1–8.1)

## 2017-08-13 ENCOUNTER — Encounter: Payer: Self-pay | Admitting: Orthopaedic Surgery

## 2017-08-13 ENCOUNTER — Ambulatory Visit (INDEPENDENT_AMBULATORY_CARE_PROVIDER_SITE_OTHER): Payer: Medicare Other | Admitting: Orthopaedic Surgery

## 2017-08-13 VITALS — BP 116/69 | HR 68 | Ht 60.0 in | Wt 139.0 lb

## 2017-08-13 DIAGNOSIS — G8929 Other chronic pain: Secondary | ICD-10-CM

## 2017-08-13 DIAGNOSIS — F17210 Nicotine dependence, cigarettes, uncomplicated: Secondary | ICD-10-CM

## 2017-08-13 DIAGNOSIS — M25512 Pain in left shoulder: Secondary | ICD-10-CM

## 2017-08-13 DIAGNOSIS — G894 Chronic pain syndrome: Secondary | ICD-10-CM

## 2017-08-13 MED ORDER — OXYCODONE-ACETAMINOPHEN 10-325 MG PO TABS: 1.0000 | ORAL_TABLET | ORAL | 0 refills | Status: DC | PRN

## 2017-08-13 NOTE — Progress Notes (Signed)
PROCEDURE NOTE:  The patient request injection, verbal consent was obtained.  The left shoulder was prepped appropriately after time out was performed.   Sterile technique was observed and injection of 1 cc of Depo-Medrol 40 mg with several cc's of plain xylocaine. Anesthesia was provided by ethyl chloride and a 20-gauge needle was used to inject the shoulder area. A posterior approach was used.  The injection was tolerated well.  A band aid dressing was applied.  The patient was advised to apply ice later today and tomorrow to the injection sight as needed.  She has lost weight, down to 139 pounds now.  She has developed pancreatitis.  I refilled her pain medicine.  I have reviewed the [place] web site prior to prescribing narcotic medicine for this patient.  Electronically Signed Sanjuana Kava, MD 5/14/201911:34 AM

## 2017-09-02 ENCOUNTER — Telehealth: Payer: Self-pay | Admitting: Orthopaedic Surgery

## 2017-09-02 NOTE — Telephone Encounter (Signed)
Patient called and requested refill on Endocet 10-325 mgs.  Qty  170  Sig: Take 1 tablet by mouth every 4 (four) hours as needed for up to 28 days for pain.  Patient states she uses [facility] in [place]

## 2017-09-03 ENCOUNTER — Telehealth: Payer: Self-pay | Admitting: Orthopaedic Surgery

## 2017-09-03 MED ORDER — OXYCODONE-ACETAMINOPHEN 10-325 MG PO TABS: 1.0000 | ORAL_TABLET | ORAL | 0 refills | Status: DC | PRN

## 2017-09-03 NOTE — Telephone Encounter (Signed)
I spoke with the Home Health nurse.  She is concerned that the patient is taking to much medication.  Her Rx was filled on 08/13/17 and she is running out.  Dr. Luna Glasgow said to send him a request for more medication and he will look at the rate, number and dates and make a decision regarding her dosage.  The request was sent to him by [facility].

## 2017-09-03 NOTE — Telephone Encounter (Signed)
Patient states that [facility] will not fill the prescription that you sent in for Endocet.  She said they told her that it was too soon on that medication.  She now wants to know if you could send in Oxycodone 15 mgs. To [facility].  She said she has to have something to last her until she gets her next refill on the [facility]

## 2017-09-03 NOTE — Telephone Encounter (Signed)
No.  She is taking too much medicine. I called it in so she would have it when it was due.  The Rx says ONE every four to six hours, not TWO.  If she takes too much, she will run out of medicine earlier.

## 2017-09-03 NOTE — Telephone Encounter (Signed)
Call received from [place] nurse - ph# [phone] - she is with patient now, and would like to speak with nurse or Dr Luna Glasgow

## 2017-09-12 ENCOUNTER — Telehealth: Payer: Self-pay | Admitting: Orthopaedic Surgery

## 2017-09-12 ENCOUNTER — Encounter: Payer: Self-pay | Admitting: Orthopaedic Surgery

## 2017-09-12 ENCOUNTER — Ambulatory Visit (INDEPENDENT_AMBULATORY_CARE_PROVIDER_SITE_OTHER): Payer: Medicare Other | Admitting: Orthopaedic Surgery

## 2017-09-12 VITALS — BP 106/68 | HR 102 | Ht 60.0 in | Wt 132.0 lb

## 2017-09-12 DIAGNOSIS — F17210 Nicotine dependence, cigarettes, uncomplicated: Secondary | ICD-10-CM

## 2017-09-12 DIAGNOSIS — G894 Chronic pain syndrome: Secondary | ICD-10-CM | POA: Diagnosis not present

## 2017-09-12 DIAGNOSIS — M25512 Pain in left shoulder: Secondary | ICD-10-CM

## 2017-09-12 DIAGNOSIS — G8929 Other chronic pain: Secondary | ICD-10-CM

## 2017-09-12 NOTE — Patient Instructions (Signed)
Steps to Quit Smoking Smoking tobacco can be bad for your health. It can also affect almost every organ in your body. Smoking puts you and people around you at risk for many serious [name]-lasting (chronic) diseases. Quitting smoking is hard, but it is one of the best things that you can do for your health. It is never too late to quit. What are the benefits of quitting smoking? When you quit smoking, you lower your risk for getting serious diseases and conditions. They can include:  Lung cancer or lung disease.  Heart disease.  Stroke.  Heart attack.  Not being able to have children (infertility).  Weak bones (osteoporosis) and broken bones (fractures).  If you have coughing, wheezing, and shortness of breath, those symptoms may get better when you quit. You may also get sick less often. If you are pregnant, quitting smoking can help to lower your chances of having a baby of low birth weight. What can I do to help me quit smoking? Talk with your doctor about what can help you quit smoking. Some things you can do (strategies) include:  Quitting smoking totally, instead of slowly cutting back how much you smoke over a period of time.  Going to in-person counseling. You are more likely to quit if you go to many counseling sessions.  Using resources and support systems, such as: ? Online chats with a counselor. ? Phone quitlines. ? Printed self-help materials. ? Support groups or group counseling. ? Text messaging programs. ? Mobile phone apps or applications.  Taking medicines. Some of these medicines may have nicotine in them. If you are pregnant or breastfeeding, do not take any medicines to quit smoking unless your doctor says it is okay. Talk with your doctor about counseling or other things that can help you.  Talk with your doctor about using more than one strategy at the same time, such as taking medicines while you are also going to in-person counseling. This can help make  quitting easier. What things can I do to make it easier to quit? Quitting smoking might feel very hard at first, but there is a lot that you can do to make it easier. Take these steps:  Talk to your family and friends. Ask them to support and encourage you.  Call phone quitlines, reach out to support groups, or work with a counselor.  Ask people who smoke to not smoke around you.  Avoid places that make you want (trigger) to smoke, such as: ? Bars. ? Parties. ? Smoke-break areas at work.  Spend time with people who do not smoke.  Lower the stress in your life. Stress can make you want to smoke. Try these things to help your stress: ? Getting regular exercise. ? Deep-breathing exercises. ? Yoga. ? Meditating. ? Doing a body scan. To do this, close your eyes, focus on one area of your body at a time from head to toe, and notice which parts of your body are tense. Try to relax the muscles in those areas.  Download or buy apps on your mobile phone or tablet that can help you stick to your quit plan. There are many free apps, such as QuitGuide from the CDC (Centers for Disease Control and Prevention). You can find more support from smokefree.gov and other websites.  This information is not intended to replace advice given to you by your health care provider. Make sure you discuss any questions you have with your health care provider. 

## 2017-09-12 NOTE — Telephone Encounter (Signed)
Would you call the [facility] in [place]?  It seems they would only fill 42 pills today for this patient.  She was very upset and crying.  She said they told her Dr. Luna Glasgow would have to do a prior authorization

## 2017-09-12 NOTE — Progress Notes (Signed)
Patient [name], female DOB:1961/02/20, 57 y.o. MGQ:[id number]  Chief Complaint  Patient presents with  . Follow-up    Chronic pain left shoulder    HPI  [name] is a 57 y.o. female who has chronic pain syndrome and chronic back and shoulder pain on the left.  She has taken all of her narcotics I had given her last month within two weeks which were supposed to last a month.  She knew this but she says she is in so much pain.  She saw her family doctor who gave her Fentanyl 75 patches and she used them.    I have offered her a pain clinic again and she refuses.  I will refill her pain medicine but again it is to last one month.  I cannot and will not refill earlier.  She signed an opioid pain agreement.  HPI  Body mass index is 25.78 kg/m.  ROS  Review of Systems  Constitutional:       Patient does not have Diabetes Mellitus. Patient has hypertension. Patient has COPD or shortness of breath. Patient has BMI > 35. Patient has current smoking history.  HENT: Negative for congestion.   Respiratory: Positive for cough and shortness of breath.        Sleep apnea  Cardiovascular: Negative for chest pain.  Gastrointestinal:       GERD  Endocrine: Positive for cold intolerance.  Musculoskeletal: Positive for arthralgias and back pain.  Allergic/Immunologic: Positive for environmental allergies.  Neurological: Positive for headaches.  All other systems reviewed and are negative.   Past Medical History:  Diagnosis Date  . Back pain   . Depression   . GERD (gastroesophageal reflux disease)   . Hypertension    diet control  . Hypothyroid   . IBS (irritable bowel syndrome)   . Migraines   . Obesity   . Osteoarthritis   . Sleep apnea     Past Surgical History:  Procedure Laterality Date  . AMPUTATION  12/13/2011   Procedure: AMPUTATION DIGIT;  Surgeon: Marcheta Grammes, DPM;  Location: [facility];  Service: Orthopedics;  Laterality: Right;  amputation  second toe right foot  . BACK SURGERY    . BIOPSY  12/23/2015   Procedure: BIOPSY;  Surgeon: Rogene Houston, MD;  Location: [facility];  Service: Endoscopy;;  pre pyloric patches  . BREAST BIOPSY    . CHOLECYSTECTOMY    . ESOPHAGOGASTRODUODENOSCOPY  05/28/2011   Procedure: ESOPHAGOGASTRODUODENOSCOPY (EGD);  Surgeon: Rogene Houston, MD;  Location: [facility];  Service: Endoscopy;  Laterality: N/A;  730  . ESOPHAGOGASTRODUODENOSCOPY N/A 10/09/2013   Procedure: ESOPHAGOGASTRODUODENOSCOPY (EGD);  Surgeon: Rogene Houston, MD;  Location: [facility];  Service: Endoscopy;  Laterality: N/A;  730  . ESOPHAGOGASTRODUODENOSCOPY (EGD) WITH PROPOFOL N/A 12/23/2015   Procedure: ESOPHAGOGASTRODUODENOSCOPY (EGD) WITH PROPOFOL;  Surgeon: Rogene Houston, MD;  Location: [facility];  Service: Endoscopy;  Laterality: N/A;  . HERNIA REPAIR    . MALONEY DILATION N/A 10/09/2013   Procedure: MALONEY DILATION;  Surgeon: Rogene Houston, MD;  Location: [facility];  Service: Endoscopy;  Laterality: N/A;  . NASAL SEPTUM SURGERY    . NECK SURGERY     2 yrs ago  . TOTAL ABDOMINAL HYSTERECTOMY     Left oophroectomy for a lare tumor about 21 yrs. RT ovary removed time of hysterectomy    History reviewed. No pertinent family history.  Social History Social History  Tobacco Use  . Smoking status: Current Every Day Smoker    Packs/day: 0.50    Years: 35.00    Pack years: 17.50  . Smokeless tobacco: Never Used  . Tobacco comment: 1 pack a day since age18  Substance Use Topics  . Alcohol use: No  . Drug use: No    Allergies  Allergen Reactions  . Cephalexin Hives    unknown  . Latex Rash  . Betadine [Povidone Iodine] Rash  . Cleocin [Clindamycin Hcl] Hives  . Sulfa Antibiotics     Vomiting     Current Outpatient Medications  Medication Sig Dispense Refill  . beta carotene w/minerals (OCUVITE) tablet Take 1 tablet by mouth daily.      . diclofenac sodium (VOLTAREN) 1 % GEL Apply topically  4 grams to affected joint as needed four times a day. 5 Tube 5  . esomeprazole (NEXIUM) 40 MG capsule Take 1 capsule (40 mg total) by mouth daily before breakfast.    . levothyroxine (SYNTHROID, LEVOTHROID) 137 MCG tablet Take 112 mcg by mouth daily before breakfast.     . LORazepam (ATIVAN) 0.5 MG tablet Take 0.5 mg by mouth every 8 (eight) hours.    . Multiple Vitamins-Iron (MULTIVITAMINS WITH IRON) TABS Take 1 tablet by mouth daily.    Marland Kitchen oxyCODONE-acetaminophen (ENDOCET) 10-325 MG tablet Take 1 tablet by mouth every 4 (four) hours as needed for up to 28 days for pain. 170 tablet 0  . polyethylene glycol (MIRALAX / GLYCOLAX) packet Take 17 g by mouth 2 (two) times daily before a meal.      . pregabalin (LYRICA) 100 MG capsule Take 100 mg by mouth 3 (three) times daily.      . sucralfate (CARAFATE) 1 g tablet TAKE 1 TABLET BY MOUTH FOUR TIMES DAILY WITH MEALS AND AT BEDTIME 120 tablet 2  . topiramate (TOPAMAX) 100 MG tablet Take 100 mg by mouth 2 (two) times daily.      . VENTOLIN HFA 108 (90 Base) MCG/ACT inhaler Inhale 1-2 puffs into the lungs as needed.     . zolpidem (AMBIEN) 10 MG tablet Take 10 mg by mouth at bedtime.      No current facility-administered medications for this visit.      Physical Exam  Blood pressure 106/68, pulse (!) 102, height 5' (1.524 m), weight 132 lb (59.9 kg).  Constitutional: overall normal hygiene, normal nutrition, well developed, normal grooming, normal body habitus. Assistive device:walker  Musculoskeletal: gait and station Limp right, muscle tone and strength are normal, no tremors or atrophy is present.  .  Neurological: coordination overall normal.  Deep tendon reflex/nerve stretch intact.  Sensation normal.  Cranial nerves II-XII intact.   Skin:   Normal overall no scars, lesions, ulcers or rashes. No psoriasis.  Psychiatric: Alert and oriented x 3.  Recent memory intact, remote memory unclear.  Normal mood and affect. Well groomed.  Good eye  contact.  Cardiovascular: overall no swelling, no varicosities, no edema bilaterally, normal temperatures of the legs and arms, no clubbing, cyanosis and good capillary refill.  Lymphatic: palpation is normal.  Examination of left Upper Extremity is done.  Inspection:   Overall:  Elbow non-tender without crepitus or defects, forearm non-tender without crepitus or defects, wrist non-tender without crepitus or defects, hand non-tender.    Shoulder: with glenohumeral joint tenderness, without effusion.   Upper arm: without swelling and tenderness   Range of motion:   Overall:  Full range of motion of the  elbow, full range of motion of wrist and full range of motion in fingers.   Shoulder:  left  150 degrees forward flexion; 120 degrees abduction; 35 degrees internal rotation, 35 degrees external rotation, 15 degrees extension, 40 degrees adduction.   Stability:   Overall:  Shoulder, elbow and wrist stable   Strength and Tone:   Overall full shoulder muscles strength, full upper arm strength and normal upper arm bulk and tone.  All other systems reviewed and are negative   The patient has been educated about the nature of the problem(s) and counseled on treatment options.  The patient appeared to understand what I have discussed and is in agreement with it.  I spent a good 15 to 20 minutes talking to her about her pain management.  She is very reluctant to change and again declines pain clinic.  Encounter Diagnoses  Name Primary?  . Chronic left shoulder pain Yes  . Pain syndrome, chronic   . Cigarette nicotine dependence without complication     PLAN Call if any problems.  Precautions discussed.  Continue current medications.   PROCEDURE NOTE:  The patient request injection, verbal consent was obtained.  The left shoulder was prepped appropriately after time out was performed.   Sterile technique was observed and injection of 1 cc of Depo-Medrol 40 mg with several cc's of plain  xylocaine. Anesthesia was provided by ethyl chloride and a 20-gauge needle was used to inject the shoulder area. A posterior approach was used.  The injection was tolerated well.  A band aid dressing was applied.  The patient was advised to apply ice later today and tomorrow to the injection sight as needed.   Return to clinic 1 month   I have reviewed the [place] web site prior to prescribing narcotic medicine for this patient.  Electronically Signed Sanjuana Kava, MD 6/13/201910:53 AM

## 2017-09-17 ENCOUNTER — Telehealth: Payer: Self-pay | Admitting: Orthopaedic Surgery

## 2017-09-17 MED ORDER — OXYCODONE-ACETAMINOPHEN 10-325 MG PO TABS: 1.0000 | ORAL_TABLET | ORAL | 0 refills | Status: DC | PRN

## 2017-09-17 NOTE — Telephone Encounter (Signed)
Oxycodone-Acetaminophen 10/325 mg    Qty  Tablets  PATIENT USES [facility] IN [place]  Patient states she was suppose to get 50 tablets but only got 42.  She is asking if you will send a new prescription for the remaining 128 tablets to her pharmacy.

## 2017-09-18 ENCOUNTER — Telehealth: Payer: Self-pay | Admitting: Orthopaedic Surgery

## 2017-09-18 MED ORDER — OXYCODONE-ACETAMINOPHEN 10-325 MG PO TABS: 1.0000 | ORAL_TABLET | ORAL | 0 refills | Status: DC | PRN

## 2017-09-18 NOTE — Telephone Encounter (Signed)
Oxycodone-Acetaminophen  10/325 mg  Qty 128 Tablets  Prescription sent in yesterday to [facility] and they don't carry this anymore per patient.  She wants to know if you could resend it to [place]

## 2017-09-18 NOTE — Telephone Encounter (Signed)
Per Dr. Luna Glasgow verbal response.  This has already been taken care of.

## 2017-10-08 ENCOUNTER — Emergency Department (HOSPITAL_COMMUNITY)
Admission: EM | Admit: 2017-10-08 | Discharge: 2017-10-09 | Disposition: A | Discharge status: Home / Self Care | Payer: Medicare Other | Attending: Emergency Medicine | Admitting: Emergency Medicine

## 2017-10-08 ENCOUNTER — Emergency Department (HOSPITAL_COMMUNITY): Payer: Medicare Other

## 2017-10-08 ENCOUNTER — Encounter (HOSPITAL_COMMUNITY): Payer: Self-pay | Admitting: Emergency Medicine

## 2017-10-08 DIAGNOSIS — Z79899 Other long term (current) drug therapy: Secondary | ICD-10-CM | POA: Diagnosis not present

## 2017-10-08 DIAGNOSIS — E039 Hypothyroidism, unspecified: Secondary | ICD-10-CM | POA: Insufficient documentation

## 2017-10-08 DIAGNOSIS — F17210 Nicotine dependence, cigarettes, uncomplicated: Secondary | ICD-10-CM | POA: Insufficient documentation

## 2017-10-08 DIAGNOSIS — Z91040 Latex allergy status: Secondary | ICD-10-CM | POA: Diagnosis not present

## 2017-10-08 DIAGNOSIS — N200 Calculus of kidney: Secondary | ICD-10-CM | POA: Diagnosis not present

## 2017-10-08 DIAGNOSIS — I10 Essential (primary) hypertension: Secondary | ICD-10-CM | POA: Insufficient documentation

## 2017-10-08 DIAGNOSIS — R1032 Left lower quadrant pain: Secondary | ICD-10-CM | POA: Diagnosis present

## 2017-10-08 LAB — CBC WITH DIFFERENTIAL/PLATELET
BASOS ABS: 0 10*3/uL (ref 0.0–0.1)
BASOS PCT: 1 %
Eosinophils Absolute: 0.3 10*3/uL (ref 0.0–0.7)
Eosinophils Relative: 6 %
HEMATOCRIT: 39.1 % (ref 36.0–46.0)
HEMOGLOBIN: 12.8 g/dL (ref 12.0–15.0)
LYMPHS PCT: 41 %
Lymphs Abs: 2.2 10*3/uL (ref 0.7–4.0)
MCH: 31.2 pg (ref 26.0–34.0)
MCHC: 32.7 g/dL (ref 30.0–36.0)
MCV: 95.4 fL (ref 78.0–100.0)
MONO ABS: 0.4 10*3/uL (ref 0.1–1.0)
Monocytes Relative: 6 %
NEUTROS ABS: 2.6 10*3/uL (ref 1.7–7.7)
NEUTROS PCT: 46 %
Platelets: 186 10*3/uL (ref 150–400)
RBC: 4.1 MIL/uL (ref 3.87–5.11)
RDW: 14.4 % (ref 11.5–15.5)
WBC: 5.5 10*3/uL (ref 4.0–10.5)

## 2017-10-08 LAB — URINALYSIS, ROUTINE W REFLEX MICROSCOPIC
Bilirubin Urine: NEGATIVE
Glucose, UA: NEGATIVE mg/dL
Hgb urine dipstick: NEGATIVE
KETONES UR: NEGATIVE mg/dL
LEUKOCYTES UA: NEGATIVE
NITRITE: NEGATIVE
PROTEIN: NEGATIVE mg/dL
Specific Gravity, Urine: 1.003 — ABNORMAL LOW (ref 1.005–1.030)
pH: 6 (ref 5.0–8.0)

## 2017-10-08 LAB — BASIC METABOLIC PANEL
ANION GAP: 6 (ref 5–15)
BUN: 13 mg/dL (ref 6–20)
CALCIUM: 8.1 mg/dL — AB (ref 8.9–10.3)
CO2: 20 mmol/L — AB (ref 22–32)
Chloride: 110 mmol/L (ref 98–111)
Creatinine, Ser: 0.53 mg/dL (ref 0.44–1.00)
GFR calc non Af Amer: >60 mL/min (ref >60)
Glucose, Bld: 82 mg/dL (ref 70–99)
POTASSIUM: 3.3 mmol/L — AB (ref 3.5–5.1)
Sodium: 136 mmol/L (ref 135–145)

## 2017-10-08 MED ORDER — SODIUM CHLORIDE 0.9 % IV BOLUS: 500.0000 mL | Freq: Once | INTRAVENOUS | Status: DC

## 2017-10-08 MED ORDER — HYDROMORPHONE HCL 1 MG/ML IJ SOLN
1.0000 mg | Freq: Once | INTRAMUSCULAR | Status: AC
  Administered 2017-10-08: 1 mg via INTRAVENOUS
  Filled 2017-10-08: qty 1

## 2017-10-08 MED ORDER — ONDANSETRON HCL 4 MG/2ML IJ SOLN
4.0000 mg | Freq: Once | INTRAMUSCULAR | Status: AC
  Administered 2017-10-08: 4 mg via INTRAVENOUS
  Filled 2017-10-08: qty 2

## 2017-10-08 MED ORDER — MORPHINE SULFATE (PF) 4 MG/ML IV SOLN
4.0000 mg | Freq: Once | INTRAVENOUS | Status: AC
  Administered 2017-10-08: 4 mg via INTRAVENOUS
  Filled 2017-10-08: qty 1

## 2017-10-08 MED ORDER — KETOROLAC TROMETHAMINE 30 MG/ML IJ SOLN
30.0000 mg | Freq: Once | INTRAMUSCULAR | Status: DC
  Filled 2017-10-08: qty 1

## 2017-10-08 NOTE — Discharge Instructions (Signed)
Make sure you follow-up with urologist in the morning.

## 2017-10-08 NOTE — ED Triage Notes (Signed)
Patient complaining of left flank pain. States she was seen at [facility] on Friday for the same and was told she had kidney stone.

## 2017-10-08 NOTE — ED Notes (Signed)
Pt refused toradol after pulling medication, wasted in sink.

## 2017-10-08 NOTE — ED Notes (Signed)
Patient upset in waiting room about the wait. Explained triage and acuity process to patient. Patient continues to holler and complain in the waiting room.

## 2017-10-09 ENCOUNTER — Encounter (HOSPITAL_COMMUNITY): Payer: Self-pay | Admitting: *Deleted

## 2017-10-09 ENCOUNTER — Ambulatory Visit (INDEPENDENT_AMBULATORY_CARE_PROVIDER_SITE_OTHER): Payer: Medicare Other | Admitting: Urology

## 2017-10-09 ENCOUNTER — Encounter (HOSPITAL_COMMUNITY)
Admission: AD | Disposition: A | Discharge status: Home / Self Care | Payer: Self-pay | Source: Ambulatory Visit | Attending: Urology

## 2017-10-09 ENCOUNTER — Ambulatory Visit (HOSPITAL_COMMUNITY): Payer: Medicare Other | Admitting: Anesthesiology

## 2017-10-09 ENCOUNTER — Ambulatory Visit (HOSPITAL_COMMUNITY): Payer: Medicare Other

## 2017-10-09 ENCOUNTER — Other Ambulatory Visit: Payer: Self-pay

## 2017-10-09 ENCOUNTER — Other Ambulatory Visit: Payer: Self-pay | Admitting: Urology

## 2017-10-09 ENCOUNTER — Ambulatory Visit (HOSPITAL_COMMUNITY)
Admission: RE | Admit: 2017-10-09 | Discharge: 2017-10-09 | Disposition: A | Discharge status: Home / Self Care | Payer: Medicare Other | Source: Ambulatory Visit | Attending: Urology | Admitting: Urology

## 2017-10-09 ENCOUNTER — Ambulatory Visit (HOSPITAL_COMMUNITY)
Admission: AD | Admit: 2017-10-09 | Discharge: 2017-10-09 | Disposition: A | Discharge status: Home / Self Care | Payer: Medicare Other | Source: Ambulatory Visit | Attending: Urology | Admitting: Urology

## 2017-10-09 DIAGNOSIS — K589 Irritable bowel syndrome without diarrhea: Secondary | ICD-10-CM | POA: Diagnosis not present

## 2017-10-09 DIAGNOSIS — I10 Essential (primary) hypertension: Secondary | ICD-10-CM | POA: Diagnosis not present

## 2017-10-09 DIAGNOSIS — N201 Calculus of ureter: Secondary | ICD-10-CM

## 2017-10-09 DIAGNOSIS — M1990 Unspecified osteoarthritis, unspecified site: Secondary | ICD-10-CM | POA: Diagnosis not present

## 2017-10-09 DIAGNOSIS — K219 Gastro-esophageal reflux disease without esophagitis: Secondary | ICD-10-CM | POA: Diagnosis not present

## 2017-10-09 DIAGNOSIS — Z9989 Dependence on other enabling machines and devices: Secondary | ICD-10-CM | POA: Insufficient documentation

## 2017-10-09 DIAGNOSIS — N132 Hydronephrosis with renal and ureteral calculous obstruction: Secondary | ICD-10-CM | POA: Insufficient documentation

## 2017-10-09 DIAGNOSIS — G4733 Obstructive sleep apnea (adult) (pediatric): Secondary | ICD-10-CM | POA: Insufficient documentation

## 2017-10-09 DIAGNOSIS — Z91040 Latex allergy status: Secondary | ICD-10-CM | POA: Insufficient documentation

## 2017-10-09 DIAGNOSIS — Z881 Allergy status to other antibiotic agents status: Secondary | ICD-10-CM | POA: Insufficient documentation

## 2017-10-09 DIAGNOSIS — Z882 Allergy status to sulfonamides status: Secondary | ICD-10-CM | POA: Insufficient documentation

## 2017-10-09 DIAGNOSIS — Z885 Allergy status to narcotic agent status: Secondary | ICD-10-CM | POA: Diagnosis not present

## 2017-10-09 DIAGNOSIS — F17210 Nicotine dependence, cigarettes, uncomplicated: Secondary | ICD-10-CM | POA: Insufficient documentation

## 2017-10-09 DIAGNOSIS — E039 Hypothyroidism, unspecified: Secondary | ICD-10-CM | POA: Insufficient documentation

## 2017-10-09 DIAGNOSIS — F329 Major depressive disorder, single episode, unspecified: Secondary | ICD-10-CM | POA: Insufficient documentation

## 2017-10-09 DIAGNOSIS — Z89421 Acquired absence of other right toe(s): Secondary | ICD-10-CM | POA: Insufficient documentation

## 2017-10-09 HISTORY — PX: CYSTOSCOPY W/ URETERAL STENT PLACEMENT: SHX1429

## 2017-10-09 LAB — URINE CULTURE: Culture: NO GROWTH

## 2017-10-09 SURGERY — CYSTOSCOPY, WITH RETROGRADE PYELOGRAM AND URETERAL STENT INSERTION
Anesthesia: General | Laterality: Left

## 2017-10-09 MED ORDER — SODIUM CHLORIDE 0.9 % IR SOLN
Status: DC | PRN
  Administered 2017-10-09: 3000 mL via INTRAVESICAL

## 2017-10-09 MED ORDER — DIATRIZOATE MEGLUMINE 30 % UR SOLN
URETHRAL | Status: DC | PRN
  Administered 2017-10-09: 100 mL via URETHRAL

## 2017-10-09 MED ORDER — STERILE WATER FOR IRRIGATION IR SOLN
Status: DC | PRN
  Administered 2017-10-09: 1000 mL

## 2017-10-09 MED ORDER — LACTATED RINGERS IV SOLN
INTRAVENOUS | Status: DC | PRN
  Administered 2017-10-09: 16:00:00 via INTRAVENOUS

## 2017-10-09 MED ORDER — FENTANYL CITRATE (PF) 100 MCG/2ML IJ SOLN
INTRAMUSCULAR | Status: AC
  Filled 2017-10-09: qty 2

## 2017-10-09 MED ORDER — HYDROMORPHONE HCL 1 MG/ML IJ SOLN: 0.2500 mg | INTRAMUSCULAR | Status: DC | PRN

## 2017-10-09 MED ORDER — OXYCODONE-ACETAMINOPHEN 5-325 MG PO TABS: 1.0000 | ORAL_TABLET | ORAL | 0 refills | Status: DC | PRN

## 2017-10-09 MED ORDER — CIPROFLOXACIN IN D5W 400 MG/200ML IV SOLN
400.0000 mg | Freq: Once | INTRAVENOUS | Status: AC
  Administered 2017-10-09: 400 mg via INTRAVENOUS
  Filled 2017-10-09: qty 200

## 2017-10-09 MED ORDER — MIDAZOLAM HCL 2 MG/2ML IJ SOLN
INTRAMUSCULAR | Status: AC
  Filled 2017-10-09: qty 2

## 2017-10-09 MED ORDER — LIDOCAINE HCL (PF) 1 % IJ SOLN
INTRAMUSCULAR | Status: AC
  Filled 2017-10-09: qty 5

## 2017-10-09 MED ORDER — HYDROCODONE-ACETAMINOPHEN 7.5-325 MG PO TABS: 1.0000 | ORAL_TABLET | Freq: Once | ORAL | Status: DC | PRN

## 2017-10-09 MED ORDER — ONDANSETRON 4 MG PO TBDP: 4.0000 mg | ORAL_TABLET | Freq: Three times a day (TID) | ORAL | 0 refills | Status: DC | PRN

## 2017-10-09 MED ORDER — PROPOFOL 10 MG/ML IV BOLUS
INTRAVENOUS | Status: AC
  Filled 2017-10-09: qty 20

## 2017-10-09 MED ORDER — LACTATED RINGERS IV SOLN: INTRAVENOUS | Status: DC

## 2017-10-09 MED ORDER — BUPIVACAINE IN DEXTROSE 0.75-8.25 % IT SOLN
INTRATHECAL | Status: AC
  Filled 2017-10-09: qty 2

## 2017-10-09 MED ORDER — PROMETHAZINE HCL 25 MG/ML IJ SOLN: 6.2500 mg | INTRAMUSCULAR | Status: DC | PRN

## 2017-10-09 MED ORDER — FENTANYL CITRATE (PF) 100 MCG/2ML IJ SOLN
INTRAMUSCULAR | Status: DC | PRN
  Administered 2017-10-09 (×4): 25 ug via INTRAVENOUS

## 2017-10-09 MED ORDER — MEPERIDINE HCL 50 MG/ML IJ SOLN: 6.2500 mg | INTRAMUSCULAR | Status: DC | PRN

## 2017-10-09 MED ORDER — DIATRIZOATE MEGLUMINE 30 % UR SOLN
URETHRAL | Status: AC
  Filled 2017-10-09: qty 100

## 2017-10-09 MED ORDER — MIDAZOLAM HCL 5 MG/5ML IJ SOLN
INTRAMUSCULAR | Status: DC | PRN
  Administered 2017-10-09: 2 mg via INTRAVENOUS

## 2017-10-09 SURGICAL SUPPLY — 18 items
BAG DRAIN URO TABLE W/ADPT NS (BAG) ×2 IMPLANT
CATH INTERMIT  6FR 70CM (CATHETERS) ×2 IMPLANT
CLOTH BEACON ORANGE TIMEOUT ST (SAFETY) ×2 IMPLANT
DECANTER SPIKE VIAL GLASS SM (MISCELLANEOUS) ×2 IMPLANT
GLOVE BIO SURGEON STRL SZ8 (GLOVE) ×2 IMPLANT
GOWN STRL REUS W/ TWL XL LVL3 (GOWN DISPOSABLE) ×1 IMPLANT
GOWN STRL REUS W/TWL LRG LVL3 (GOWN DISPOSABLE) ×2 IMPLANT
GOWN STRL REUS W/TWL XL LVL3 (GOWN DISPOSABLE) ×1
GUIDEWIRE ANG ZIPWIRE 038X150 (WIRE) ×2 IMPLANT
GUIDEWIRE STR ZIPWIRE 035X150 (MISCELLANEOUS) ×2 IMPLANT
IV NS IRRIG 3000ML ARTHROMATIC (IV SOLUTION) ×2 IMPLANT
KIT TURNOVER CYSTO (KITS) ×2 IMPLANT
MANIFOLD NEPTUNE II (INSTRUMENTS) ×2 IMPLANT
PACK CYSTO (CUSTOM PROCEDURE TRAY) ×2 IMPLANT
PAD ARMBOARD 7.5X6 YLW CONV (MISCELLANEOUS) ×2 IMPLANT
STENT URET 6FRX26 CONTOUR (STENTS) ×2 IMPLANT
SYR 10ML LL (SYRINGE) ×2 IMPLANT
TOWEL OR 17X26 4PK STRL BLUE (TOWEL DISPOSABLE) ×2 IMPLANT

## 2017-10-09 NOTE — ED Provider Notes (Signed)
[facility] EMERGENCY DEPARTMENT Provider Note   CSN: [id number] Arrival date & time: 10/08/17  1542     History   Chief Complaint Chief Complaint  Patient presents with  . Flank Pain    HPI [name] is a 57 y.o. female.  Patient with a known history of a left-sided kidney stone dxed on Friday at [facility] presents with left flank pain and pain management issues.  She has an appointment with urology on Wednesday morning.  No dysuria, hematuria, fever, sweats, chills.  Severity of pain is moderate to severe.  She has run out of her pain medication.  Nothing makes pain better or worse.  She presents with a CD from her previous visit emergency visit.     Past Medical History:  Diagnosis Date  . Back pain   . Depression   . GERD (gastroesophageal reflux disease)   . Hypertension    diet control  . Hypothyroid   . IBS (irritable bowel syndrome)   . Migraines   . Obesity   . Osteoarthritis   . Sleep apnea     Patient Active Problem List   Diagnosis Date Noted  . Chronic right shoulder pain 09/04/2016  . Cigarette nicotine dependence without complication 40/98/1191  . Morbid obesity due to excess calories ([place]) 09/04/2016  . RUQ abdominal pain 12/13/2015  . Hematemesis with nausea 12/13/2015  . Nausea with vomiting 12/13/2015  . Kidney stone 11/17/2015  . Right shoulder pain 05/26/2015  . Dysphagia, unspecified(787.20) 10/01/2013  . Unspecified hypothyroidism 10/01/2013  . Unspecified sleep apnea 10/01/2013  . Migraines 10/01/2013  . GERD (gastroesophageal reflux disease) 10/01/2013  . History of IBS 10/01/2013    Past Surgical History:  Procedure Laterality Date  . AMPUTATION  12/13/2011   Procedure: AMPUTATION DIGIT;  Surgeon: Marcheta Grammes, DPM;  Location: [facility];  Service: Orthopedics;  Laterality: Right;  amputation second toe right foot  . BACK SURGERY    . BIOPSY  12/23/2015   Procedure: BIOPSY;  Surgeon: Rogene Houston, MD;   Location[facility];  Service: Endoscopy;;  pre pyloric patches  . BREAST BIOPSY    . CHOLECYSTECTOMY    . ESOPHAGOGASTRODUODENOSCOPY  05/28/2011   Procedure: ESOPHAGOGASTRODUODENOSCOPY (EGD);  Surgeon: Rogene Houston, MD;  Location[facility];  Service: Endoscopy;  Laterality: N/A;  730  . ESOPHAGOGASTRODUODENOSCOPY N/A 10/09/2013   Procedure: ESOPHAGOGASTRODUODENOSCOPY (EGD);  Surgeon: Rogene Houston, MD;  [facility];  Service: Endoscopy;  Laterality: N/A;  730  . ESOPHAGOGASTRODUODENOSCOPY (EGD) WITH PROPOFOL N/A 12/23/2015   Procedure: ESOPHAGOGASTRODUODENOSCOPY (EGD) WITH PROPOFOL;  Surgeon: Rogene Houston, MD;  [facility];  Service: Endoscopy;  Laterality: N/A;  . HERNIA REPAIR    . MALONEY DILATION N/A 10/09/2013   Procedure: MALONEY DILATION;  Surgeon: Rogene Houston, MD;  Location: [facility];  Service: Endoscopy;  Laterality: N/A;  . NASAL SEPTUM SURGERY    . NECK SURGERY     2 yrs ago  . TOTAL ABDOMINAL HYSTERECTOMY     Left oophroectomy for a lare tumor about 21 yrs. RT ovary removed time of hysterectomy     OB History   None      Home Medications    Prior to Admission medications   Medication Sig Start Date End Date Taking? Authorizing Provider  beta carotene w/minerals (OCUVITE) tablet Take 1 tablet by mouth daily.     Yes [provider]  Docusate Sodium 100 MG  capsule Take 100 mg by mouth 2 (two) times daily.   Yes [provider]  esomeprazole (NEXIUM) 40 MG capsule Take 1 capsule (40 mg total) by mouth daily before breakfast. 12/13/15  Yes Rehman, Mechele Dawley, MD  hydrOXYzine (VISTARIL) 25 MG capsule Take 25 mg by mouth every 6 (six) hours as needed for anxiety.  09/04/17  Yes [provider]  levothyroxine (SYNTHROID, LEVOTHROID) 112 MCG tablet Take 112 mcg by mouth daily before breakfast.    Yes [provider]  LINZESS 290 MCG CAPS capsule Take 290 mcg by mouth daily. 10/04/17  Yes [provider]  Multiple Vitamins-Iron (MULTIVITAMINS WITH IRON) TABS Take 1 tablet by mouth daily.   Yes [provider]  naloxone (NARCAN) nasal spray 4 mg/0.1 mL CALL 911. ADMINISTER A SINGLE SPRAY OF NARCAN IN ONE NOSTRIL, REPEAT EVERY 3 MINUTES AS NEEDED IF NO OR MINIMAL RESPONSE 03/08/17  Yes [provider]  polyethylene glycol (MIRALAX / GLYCOLAX) packet Take 17 g by mouth 2 (two) times daily before a meal.     Yes [provider]  potassium chloride (MICRO-K) 10 MEQ CR capsule Take 10 mEq by mouth 2 (two) times daily. 10/04/17  Yes [provider]  pregabalin (LYRICA) 100 MG capsule Take 100 mg by mouth 2 (two) times daily. *May take three times daily   Yes [provider]  silver sulfADIAZINE (SSD) 1 % cream APPLY TO THE AFFECTED AREA(S) TWICE DAILY UNTIL HEALED 09/16/17  Yes [provider]  sucralfate (CARAFATE) 1 g tablet TAKE 1 TABLET BY MOUTH FOUR TIMES DAILY WITH MEALS AND AT BEDTIME 08/24/16  Yes Rehman, Najeeb U, MD  SUMAtriptan (IMITREX) 100 MG tablet TAKE 1 TABLET BY MOUTH AT ONSET OF HEADACHE. MAY REPEAT ONCE IN TWO HOURS. NO MORE THAN TWO TABLETS IN 24 HOURS 09/11/17  Yes [provider]  tiZANidine (ZANAFLEX) 4 MG tablet Take 4 mg by mouth 3 (three) times daily as needed for muscle spasms.  10/01/17  Yes [provider]  topiramate (TOPAMAX) 100 MG tablet Take 100 mg by mouth 2 (two) times daily.     Yes [provider]  traZODone (DESYREL) 50 MG tablet Take 150 mg by mouth at bedtime. 10/05/17  Yes [provider]  VENTOLIN HFA 108 (90 Base) MCG/ACT inhaler Inhale 1-2 puffs into the lungs every 6 (six) hours as needed for wheezing or shortness of breath.  06/01/16  Yes [provider]  oxyCODONE-acetaminophen (PERCOCET) 5-325 MG tablet Take 1-2 tablets by mouth every 4 (four) hours as needed. 10/09/17   Nat Christen, MD    Family History History reviewed. No pertinent family  history.  Social History Social History   Tobacco Use  . Smoking status: Current Every Day Smoker    Packs/day: 0.50    Years: 35.00    Pack years: 17.50  . Smokeless tobacco: Never Used  . Tobacco comment: 1 pack a day since age18  Substance Use Topics  . Alcohol use: No  . Drug use: No     Allergies   Cephalexin; Latex; Betadine [povidone iodine]; Cleocin [clindamycin hcl]; Sulfa antibiotics; and Toradol [ketorolac tromethamine]   Review of Systems Review of Systems  All other systems reviewed and are negative.    Physical Exam Updated Vital Signs BP (!) 128/91 (BP Location: Right Arm)   Pulse 63   Temp 98.2 F (36.8 C) (Temporal)   Resp 14   Ht 5\' 1"  (1.549 m)   Wt 62.1  kg (137 lb)   SpO2 99%   BMI 25.89 kg/m   Physical Exam  Constitutional: She is oriented to person, place, and time.  In pain.  HENT:  Head: Normocephalic and atraumatic.  Eyes: Conjunctivae are normal.  Neck: Neck supple.  Cardiovascular: Normal rate and regular rhythm.  Pulmonary/Chest: Effort normal and breath sounds normal.  Abdominal: Soft. Bowel sounds are normal.  Genitourinary:  Genitourinary Comments: Tender left flank.  Musculoskeletal: Normal range of motion.  Neurological: She is alert and oriented to person, place, and time.  Skin: Skin is warm and dry.  Psychiatric: She has a normal mood and affect. Her behavior is normal.  Nursing note and vitals reviewed.    ED Treatments / Results  Labs (all labs ordered are listed, but only abnormal results are displayed) Labs Reviewed  URINALYSIS, ROUTINE W REFLEX MICROSCOPIC - Abnormal; Notable for the following components:      Result Value   Color, Urine STRAW (*)    Specific Gravity, Urine 1.003 (*)    All other components within normal limits  BASIC METABOLIC PANEL - Abnormal; Notable for the following components:   Potassium 3.3 (*)    CO2 20 (*)    Calcium 8.1 (*)    All other components within normal limits  URINE  CULTURE  CBC WITH DIFFERENTIAL/PLATELET    EKG None  Radiology Dg Abdomen 1 View  Result Date: 10/08/2017 CLINICAL DATA:  57 y/o F; left flank pain with recent diagnosis of kidney stone. EXAM: ABDOMEN - 1 VIEW COMPARISON:  10/02/2017 CT abdomen and pelvis. FINDINGS: Normal bowel gas pattern. Surgical clips project over the mid and left hemiabdomen. Bowel gas largely obscures the kidneys. Stones on prior CT are poorly visualized. Moderate dextrocurvature of the lumbar spine. IMPRESSION: 1. Kidneys are largely obscured by bowel gas. Stones on prior CT are poorly visualized. 2. Normal bowel gas pattern. Electronically Signed   By: Kristine Garbe M.D.   On: 10/08/2017 22:01    Procedures Procedures (including critical care time)  Medications Ordered in ED Medications  ondansetron [facility]) injection 4 mg (4 mg Intravenous Given 10/08/17 2138)  morphine 4 MG/ML injection 4 mg (4 mg Intravenous Given 10/08/17 2139)  HYDROmorphone (DILAUDID) injection 1 mg (1 mg Intravenous Given 10/08/17 2339)     Initial Impression / Assessment and Plan / ED Course  I have reviewed the triage vital signs and the nursing notes.  Pertinent labs & imaging results that were available during my care of the patient were reviewed by me and considered in my medical decision making (see chart for details).     Patient presents with left flank pain.  I reviewed her CD from a previous emergency visit.  She appears to have a large approximately 8 mm stone in her ureter.  She was given pain management in the emergency department.  She has follow-up with urology in the morning.  Final Clinical Impressions(s) / ED Diagnoses   Final diagnoses:  Kidney stone on left side    ED Discharge Orders        Ordered    oxyCODONE-acetaminophen (PERCOCET) 5-325 MG tablet  Every 4 hours PRN     10/09/17 0012       Nat Christen, MD 10/09/17 1256

## 2017-10-09 NOTE — Anesthesia Postprocedure Evaluation (Signed)
Anesthesia Post Note  Patient: [name]  Procedure(s) Performed: CYSTOSCOPY WITH RETROGRADE PYELOGRAM/URETERAL STENT PLACEMENT (Left )  Patient location during evaluation: PACU Anesthesia Type: General Level of consciousness: awake, awake and alert, oriented and patient cooperative Pain management: pain level controlled Vital Signs Assessment: post-procedure vital signs reviewed and stable Respiratory status: spontaneous breathing Cardiovascular status: blood pressure returned to baseline and stable Postop Assessment: no headache Anesthetic complications: no     Last Vitals:  Vitals:   10/09/17 1725 10/09/17 1730  BP: 121/71 113/75  Pulse: 91 81  Resp: 20 13  Temp: (P) 36.9 C   SpO2: (P) 100% 100%    Last Pain:  Vitals:   10/09/17 1536  TempSrc: Oral  PainSc: 10-Worst pain ever                 Vena Rua

## 2017-10-09 NOTE — Anesthesia Procedure Notes (Signed)
Procedure Name: Intubation Performed by: Vena Rua, MD Pre-anesthesia Checklist: Patient identified, Emergency Drugs available, Suction available, Patient being monitored and Timeout performed Patient Re-evaluated:Patient Re-evaluated prior to induction Oxygen Delivery Method: Circle system utilized Preoxygenation: Pre-oxygenation with 100% oxygen Induction Type: IV induction, Rapid sequence and Cricoid Pressure applied Ventilation: Mask ventilation without difficulty Laryngoscope Size: Mac and 3 Grade View: Grade II Tube type: Oral Tube size: 7.0 mm Number of attempts: 1 Airway Equipment and Method: Oral airway and Stylet Placement Confirmation: ETT inserted through vocal cords under direct vision,  positive ETCO2 and breath sounds checked- equal and bilateral Tube secured with: Tape Dental Injury: Teeth and Oropharynx as per pre-operative assessment

## 2017-10-09 NOTE — Progress Notes (Signed)
From OR. Awake. Talking. Moaning/groaning. C/O sore throat. Reassurance given.

## 2017-10-09 NOTE — Progress Notes (Signed)
Nephew here. D/C instructions reviewed with him and pt. Rx to nephew. Voiced understanding. D/C to home in good condition.

## 2017-10-09 NOTE — Discharge Instructions (Signed)

## 2017-10-09 NOTE — Anesthesia Preprocedure Evaluation (Signed)
Anesthesia Evaluation  Patient identified by MRN, date of birth, ID band Patient awake    Reviewed: Allergy & Precautions, H&P , NPO status , Patient's Chart, lab work & pertinent test results, reviewed documented beta blocker date and time   Airway Mallampati: II  TM Distance: >3 FB Neck ROM: full    Dental no notable dental hx. (+) Edentulous Lower, Edentulous Upper   Pulmonary neg pulmonary ROS, sleep apnea and Continuous Positive Airway Pressure Ventilation , Current Smoker,    Pulmonary exam normal breath sounds clear to auscultation       Cardiovascular Exercise Tolerance: Good hypertension, On Medications negative cardio ROS   Rhythm:regular Rate:Normal     Neuro/Psych  Headaches, PSYCHIATRIC DISORDERS Depression negative neurological ROS  negative psych ROS   GI/Hepatic negative GI ROS, Neg liver ROS, GERD  ,  Endo/Other  negative endocrine ROSHypothyroidism   Renal/GU CRF and ARFnegative Renal ROS  negative genitourinary   Musculoskeletal   Abdominal   Peds  Hematology negative hematology ROS (+)   Anesthesia Other Findings Plt 186, K 3.3 ACD/F with persistent neck pain OSA with CPAP Denies low back sx Renal Dialysis during multi-organ failure "years ago" 360# s/p GBP sx and multiple sx and illnesses now 135#  Reproductive/Obstetrics negative OB ROS                             Anesthesia Physical Anesthesia Plan  ASA: IV and emergent  Anesthesia Plan: Spinal   Post-op Pain Management:    Induction:   PONV Risk Score and Plan:   Airway Management Planned:   Additional Equipment:   Intra-op Plan:   Post-operative Plan:   Informed Consent: I have reviewed the patients History and Physical, chart, labs and discussed the procedure including the risks, benefits and alternatives for the proposed anesthesia with the patient or authorized representative who has indicated  his/her understanding and acceptance.   Dental Advisory Given  Plan Discussed with: CRNA and Anesthesiologist  Anesthesia Plan Comments:         Anesthesia Quick Evaluation

## 2017-10-09 NOTE — Transfer of Care (Signed)
Immediate Anesthesia Transfer of Care Note  Patient: [name]  Procedure(s) Performed: CYSTOSCOPY WITH RETROGRADE PYELOGRAM/URETERAL STENT PLACEMENT (Left )  Patient Location: PACU  Anesthesia Type:General  Level of Consciousness: awake, oriented and patient cooperative  Airway & Oxygen Therapy: Patient Spontanous Breathing  Post-op Assessment: Report given to RN and Patient moving all extremities  Post vital signs: Reviewed and stable  Last Vitals:  Vitals Value Taken Time  BP 121/71 10/09/2017  5:25 PM  Temp    Pulse 93 10/09/2017  5:27 PM  Resp 21 10/09/2017  5:27 PM  SpO2 94 % 10/09/2017  5:27 PM  Vitals shown include unvalidated device data.  Last Pain:  Vitals:   10/09/17 1536  TempSrc: Oral  PainSc: 10-Worst pain ever      Patients Stated Pain Goal: 5 (01/48/40 3979)  Complications: No apparent anesthesia complications

## 2017-10-09 NOTE — Anesthesia Procedure Notes (Signed)
Spinal  Staffing Anesthesiologist: Vena Rua, MD Preanesthetic Checklist Completed: patient identified, site marked, surgical consent, pre-op evaluation, timeout performed, IV checked, risks and benefits discussed and monitors and equipment checked Spinal Block Patient position: sitting Prep: ChloraPrep Patient monitoring: heart rate, continuous pulse ox, blood pressure and cardiac monitor Location: L4-5

## 2017-10-09 NOTE — Progress Notes (Addendum)
Awake. Taking sips of water without difficulty. Continues c/o throat pain. Swallowing without difficulty. Reassurance given. Encouraged cough drops when gets home. Dressed in PACU. To w/c then transferred to postop.

## 2017-10-09 NOTE — H&P (View-Only) (Signed)
Urology Admission H&P  Chief Complaint: left flank pain  History of Present Illness: [name] is a 57yo with ahx of nephrolithiasis who presented to the ER twice in the past 3 days with intractable left flank pain. She was daignosed with a 86mm left proximal ureteral calculus  Past Medical History:  Diagnosis Date  . Back pain   . Depression   . GERD (gastroesophageal reflux disease)   . Hypertension    diet control  . Hypothyroid   . IBS (irritable bowel syndrome)   . Migraines   . Obesity   . Osteoarthritis   . Sleep apnea    Past Surgical History:  Procedure Laterality Date  . AMPUTATION  12/13/2011   Procedure: AMPUTATION DIGIT;  Surgeon: Marcheta Grammes, DPM;  Location: [facility];  Service: Orthopedics;  Laterality: Right;  amputation second toe right foot  . BACK SURGERY    . BIOPSY  12/23/2015   Procedure: BIOPSY;  Surgeon: Rogene Houston, MD;  [facility];  Service: Endoscopy;;  pre pyloric patches  . BREAST BIOPSY    . CHOLECYSTECTOMY    . ESOPHAGOGASTRODUODENOSCOPY  05/28/2011   Procedure: ESOPHAGOGASTRODUODENOSCOPY (EGD);  Surgeon: Rogene Houston, MD;  [facility];  Service: Endoscopy;  Laterality: N/A;  730  . ESOPHAGOGASTRODUODENOSCOPY N/A 10/09/2013   Procedure: ESOPHAGOGASTRODUODENOSCOPY (EGD);  Surgeon: Rogene Houston, MD;  [facility];  Service: Endoscopy;  Laterality: N/A;  730  . ESOPHAGOGASTRODUODENOSCOPY (EGD) WITH PROPOFOL N/A 12/23/2015   Procedure: ESOPHAGOGASTRODUODENOSCOPY (EGD) WITH PROPOFOL;  Surgeon: Rogene Houston, MD;  [facility];  Service: Endoscopy;  Laterality: N/A;  . HERNIA REPAIR    . MALONEY DILATION N/A 10/09/2013   Procedure: MALONEY DILATION;  Surgeon: Rogene Houston, MD;  [facility];  Service: Endoscopy;  Laterality: N/A;  . NASAL SEPTUM SURGERY    . NECK SURGERY     2 yrs ago  . TOTAL ABDOMINAL HYSTERECTOMY     Left oophroectomy for a lare tumor about 21 yrs. RT ovary  removed time of hysterectomy    Home Medications:  Current Facility-Administered Medications  Medication Dose Route Frequency Provider Last Rate Last Dose  . ciprofloxacin (CIPRO) IVPB 400 mg  400 mg Intravenous Once Karletta Millay, Candee Furbish, MD       Allergies:  Allergies  Allergen Reactions  . Cephalexin Hives    unknown  . Latex Rash  . Betadine [Povidone Iodine] Rash  . Cleocin [Clindamycin Hcl] Hives  . Sulfa Antibiotics     Vomiting   . Toradol [Ketorolac Tromethamine] Nausea And Vomiting    migraine    History reviewed. No pertinent family history. Social History:  reports that she has been smoking.  She has a 17.50 pack-year smoking history. She has never used smokeless tobacco. She reports that she does not drink alcohol or use drugs.  Review of Systems  Gastrointestinal: Positive for abdominal pain.  Genitourinary: Positive for flank pain.  All other systems reviewed and are negative.   Physical Exam:  Vital signs in last 24 hours: Temp:  [98 F (36.7 C)] 98 F (36.7 C) (07/10 1536) Pulse Rate:  [63-90] 90 (07/10 1536) Resp:  [14] 14 (07/10 0000) BP: (125-128)/(74-91) 125/74 (07/10 1536) SpO2:  [97 %-99 %] 97 % (07/10 1536) Physical Exam  Constitutional: She is oriented to person, place, and time. She appears well-developed and well-nourished.  HENT:  Head: Normocephalic and atraumatic.  Eyes: Pupils are equal, round,  and reactive to light. EOM are normal.  Neck: Normal range of motion. No thyromegaly present.  Cardiovascular: Normal rate and regular rhythm.  Respiratory: Effort normal. No respiratory distress.  GI: Soft. She exhibits no distension.  Musculoskeletal: Normal range of motion. She exhibits no edema.  Neurological: She is alert and oriented to person, place, and time.  Skin: Skin is warm and dry.  Psychiatric: She has a normal mood and affect. Her behavior is normal. Judgment and thought content normal.    Laboratory Data:  Results for orders  placed or performed during the hospital encounter of 10/08/17 (from the past 24 hour(s))  CBC with Differential     Status: None   Collection Time: 10/08/17  9:27 PM  Result Value Ref Range   WBC 5.5 4.0 - 10.5 K/uL   RBC 4.10 3.87 - 5.11 MIL/uL   Hemoglobin 12.8 12.0 - 15.0 g/dL   HCT 39.1 36.0 - 46.0 %   MCV 95.4 78.0 - 100.0 fL   MCH 31.2 26.0 - 34.0 pg   MCHC 32.7 30.0 - 36.0 g/dL   RDW 14.4 11.5 - 15.5 %   Platelets 186 150 - 400 K/uL   Neutrophils Relative % 46 %   Neutro Abs 2.6 1.7 - 7.7 K/uL   Lymphocytes Relative 41 %   Lymphs Abs 2.2 0.7 - 4.0 K/uL   Monocytes Relative 6 %   Monocytes Absolute 0.4 0.1 - 1.0 K/uL   Eosinophils Relative 6 %   Eosinophils Absolute 0.3 0.0 - 0.7 K/uL   Basophils Relative 1 %   Basophils Absolute 0.0 0.0 - 0.1 K/uL  Basic metabolic panel     Status: Abnormal   Collection Time: 10/08/17  9:27 PM  Result Value Ref Range   Sodium 136 135 - 145 mmol/L   Potassium 3.3 (L) 3.5 - 5.1 mmol/L   Chloride 110 98 - 111 mmol/L   CO2 20 (L) 22 - 32 mmol/L   Glucose, Bld 82 70 - 99 mg/dL   BUN 13 6 - 20 mg/dL   Creatinine, Ser 0.53 0.44 - 1.00 mg/dL   Calcium 8.1 (L) 8.9 - 10.3 mg/dL   GFR calc non Af Amer >60 >60 mL/min   GFR calc Af Amer >60 >60 mL/min   Anion gap 6 5 - 15   Recent Results (from the past 240 hour(s))  Urine C&S     Status: None   Collection Time: 10/08/17  3:51 PM  Result Value Ref Range Status   Specimen Description   Final    URINE, CLEAN CATCH Performed at [facility], [address] [place], [place] [address]    Special Requests   Final    NONE Performed at [facility], [address] [place], [place] [address]    Culture   Final    NO GROWTH Performed at [facility], [address], [place], [place] [address]    Report Status 10/09/2017 FINAL  Final   Creatinine: Recent Labs    10/08/17 2127  CREATININE 0.53   Baseline Creatinine: 0.5  Impression/Assessment:  57yo with left ureteral calculus and  intractable pain  Plan:  The risks/benefits/altenratives to left ureteral stent placement was explained to the patient and she understands and wishes to proceed with surgery  [name] 10/09/2017, 4:34 PM

## 2017-10-09 NOTE — H&P (Signed)
Urology Admission H&P  Chief Complaint: left flank pain  History of Present Illness: [name] is a 57yo with ahx of nephrolithiasis who presented to the ER twice in the past 3 days with intractable left flank pain. She was daignosed with a 44mm left proximal ureteral calculus  Past Medical History:  Diagnosis Date  . Back pain   . Depression   . GERD (gastroesophageal reflux disease)   . Hypertension    diet control  . Hypothyroid   . IBS (irritable bowel syndrome)   . Migraines   . Obesity   . Osteoarthritis   . Sleep apnea    Past Surgical History:  Procedure Laterality Date  . AMPUTATION  12/13/2011   Procedure: AMPUTATION DIGIT;  Surgeon: Marcheta Grammes, DPM;  Location: [facility];  Service: Orthopedics;  Laterality: Right;  amputation second toe right foot  . BACK SURGERY    . BIOPSY  12/23/2015   Procedure: BIOPSY;  Surgeon: Rogene Houston, MD;  [facility];  Service: Endoscopy;;  pre pyloric patches  . BREAST BIOPSY    . CHOLECYSTECTOMY    . ESOPHAGOGASTRODUODENOSCOPY  05/28/2011   Procedure: ESOPHAGOGASTRODUODENOSCOPY (EGD);  Surgeon: Rogene Houston, MD;  [facility];  Service: Endoscopy;  Laterality: N/A;  730  . ESOPHAGOGASTRODUODENOSCOPY N/A 10/09/2013   Procedure: ESOPHAGOGASTRODUODENOSCOPY (EGD);  Surgeon: Rogene Houston, MD;  [facility];  Service: Endoscopy;  Laterality: N/A;  730  . ESOPHAGOGASTRODUODENOSCOPY (EGD) WITH PROPOFOL N/A 12/23/2015   Procedure: ESOPHAGOGASTRODUODENOSCOPY (EGD) WITH PROPOFOL;  Surgeon: Rogene Houston, MD;  [facility];  Service: Endoscopy;  Laterality: N/A;  . HERNIA REPAIR    . MALONEY DILATION N/A 10/09/2013   Procedure: MALONEY DILATION;  Surgeon: Rogene Houston, MD;  [facility];  Service: Endoscopy;  Laterality: N/A;  . NASAL SEPTUM SURGERY    . NECK SURGERY     2 yrs ago  . TOTAL ABDOMINAL HYSTERECTOMY     Left oophroectomy for a lare tumor about 21 yrs. RT ovary  removed time of hysterectomy    Home Medications:  Current Facility-Administered Medications  Medication Dose Route Frequency Provider Last Rate Last Dose  . ciprofloxacin (CIPRO) IVPB 400 mg  400 mg Intravenous Once McKenzie, Candee Furbish, MD       Allergies:  Allergies  Allergen Reactions  . Cephalexin Hives    unknown  . Latex Rash  . Betadine [Povidone Iodine] Rash  . Cleocin [Clindamycin Hcl] Hives  . Sulfa Antibiotics     Vomiting   . Toradol [Ketorolac Tromethamine] Nausea And Vomiting    migraine    History reviewed. No pertinent family history. Social History:  reports that she has been smoking.  She has a 17.50 pack-year smoking history. She has never used smokeless tobacco. She reports that she does not drink alcohol or use drugs.  Review of Systems  Gastrointestinal: Positive for abdominal pain.  Genitourinary: Positive for flank pain.  All other systems reviewed and are negative.   Physical Exam:  Vital signs in last 24 hours: Temp:  [98 F (36.7 C)] 98 F (36.7 C) (07/10 1536) Pulse Rate:  [63-90] 90 (07/10 1536) Resp:  [14] 14 (07/10 0000) BP: (125-128)/(74-91) 125/74 (07/10 1536) SpO2:  [97 %-99 %] 97 % (07/10 1536) Physical Exam  Constitutional: She is oriented to person, place, and time. She appears well-developed and well-nourished.  HENT:  Head: Normocephalic and atraumatic.  Eyes: Pupils are equal, round,  and reactive to light. EOM are normal.  Neck: Normal range of motion. No thyromegaly present.  Cardiovascular: Normal rate and regular rhythm.  Respiratory: Effort normal. No respiratory distress.  GI: Soft. She exhibits no distension.  Musculoskeletal: Normal range of motion. She exhibits no edema.  Neurological: She is alert and oriented to person, place, and time.  Skin: Skin is warm and dry.  Psychiatric: She has a normal mood and affect. Her behavior is normal. Judgment and thought content normal.    Laboratory Data:  Results for orders  placed or performed during the hospital encounter of 10/08/17 (from the past 24 hour(s))  CBC with Differential     Status: None   Collection Time: 10/08/17  9:27 PM  Result Value Ref Range   WBC 5.5 4.0 - 10.5 K/uL   RBC 4.10 3.87 - 5.11 MIL/uL   Hemoglobin 12.8 12.0 - 15.0 g/dL   HCT 39.1 36.0 - 46.0 %   MCV 95.4 78.0 - 100.0 fL   MCH 31.2 26.0 - 34.0 pg   MCHC 32.7 30.0 - 36.0 g/dL   RDW 14.4 11.5 - 15.5 %   Platelets 186 150 - 400 K/uL   Neutrophils Relative % 46 %   Neutro Abs 2.6 1.7 - 7.7 K/uL   Lymphocytes Relative 41 %   Lymphs Abs 2.2 0.7 - 4.0 K/uL   Monocytes Relative 6 %   Monocytes Absolute 0.4 0.1 - 1.0 K/uL   Eosinophils Relative 6 %   Eosinophils Absolute 0.3 0.0 - 0.7 K/uL   Basophils Relative 1 %   Basophils Absolute 0.0 0.0 - 0.1 K/uL  Basic metabolic panel     Status: Abnormal   Collection Time: 10/08/17  9:27 PM  Result Value Ref Range   Sodium 136 135 - 145 mmol/L   Potassium 3.3 (L) 3.5 - 5.1 mmol/L   Chloride 110 98 - 111 mmol/L   CO2 20 (L) 22 - 32 mmol/L   Glucose, Bld 82 70 - 99 mg/dL   BUN 13 6 - 20 mg/dL   Creatinine, Ser 0.53 0.44 - 1.00 mg/dL   Calcium 8.1 (L) 8.9 - 10.3 mg/dL   GFR calc non Af Amer >60 >60 mL/min   GFR calc Af Amer >60 >60 mL/min   Anion gap 6 5 - 15   Recent Results (from the past 240 hour(s))  Urine C&S     Status: None   Collection Time: 10/08/17  3:51 PM  Result Value Ref Range Status   Specimen Description   Final    URINE, CLEAN CATCH Performed at [facility], [address], [place], [place] [address]    Special Requests   Final    NONE Performed at [facility], [address] [place], [place] [address]    Culture   Final    NO GROWTH Performed at [facility], [address], [place], [place] [address]    Report Status 10/09/2017 FINAL  Final   Creatinine: Recent Labs    10/08/17 2127  CREATININE 0.53   Baseline Creatinine: 0.5  Impression/Assessment:  57yo with left ureteral calculus and  intractable pain  Plan:  The risks/benefits/altenratives to left ureteral stent placement was explained to the patient and she understands and wishes to proceed with surgery  Nicolette Bang 10/09/2017, 4:34 PM

## 2017-10-09 NOTE — Op Note (Signed)
.  Preoperative diagnosis: Left ureteral stone, intractable pain  Postoperative diagnosis: Same  Procedure: 1 cystoscopy 2. Left retrograde pyelography 3.  Intraoperative fluoroscopy, under one hour, with interpretation 4. Left 6 x 26 JJ stent placement  Attending: Nicolette Bang  Anesthesia: General  Estimated blood loss: None  Drains: Left 6 x 26 JJ ureteral stent without tether  Specimens: unone  Antibiotics: cipro  Findings: left proximal ureteral stone. Moderate hydronephrosis. No masses/lesions in the bladder. Ureteral orifices in normal anatomic location.  Indications: Patient is a 57 year old female with a history of left ureteral stone and intractable pain.  After discussing treatment options, they decided proceed with left stent placement.  Procedure her in detail: The patient was brought to the operating room and a brief timeout was done to ensure correct patient, correct procedure, correct site.  General anesthesia was administered patient was placed in dorsal lithotomy position.  Their genitalia was then prepped and draped in usual sterile fashion.  A rigid 81 French cystoscope was passed in the urethra and the bladder.  Bladder was inspected free masses or lesions.  the ureteral orifices were in the normal orthotopic locations.  a 6 french ureteral catheter was then instilled into the left ureteral orifice.  a gentle retrograde was obtained and findings noted above.  we then placed a zip wire through the ureteral catheter and advanced up to the renal pelvis.    We then placed a 6 x 26 double-j ureteral stent over the original zip wire.  We then removed the wire and good coil was noted in the the renal pelvis under fluoroscopy and the bladder under direct vision. the bladder was then drained and this concluded the procedure which was well tolerated by patient.  Complications: None  Condition: Stable, extubated, transferred to PACU  Plan: Patient is to be discharge home and  followup in 2 weeks for stone extraction.

## 2017-10-10 ENCOUNTER — Telehealth: Payer: Self-pay | Admitting: Orthopaedic Surgery

## 2017-10-10 ENCOUNTER — Ambulatory Visit: Payer: Medicare Other | Admitting: Orthopaedic Surgery

## 2017-10-10 MED ORDER — OXYCODONE-ACETAMINOPHEN 10-325 MG PO TABS: 1.0000 | ORAL_TABLET | ORAL | 0 refills | Status: DC | PRN

## 2017-10-10 NOTE — Telephone Encounter (Signed)
Patient called and canceled her appointment today due to being in and out of the hospital due to having a large kidney stone.   She states that right now the doctor cant do surgery on it but did place a stent for the next 2 weeks.  He will then re-evaluate her.  She does request a refill on Oxycodone/Acetaminophen(Endocet) 10-325 mgs.   Qty  128  Sig: Take 1 tablet by mouth every 4 (four) hours as needed for up to 28 days for pain.  She does use [facility] in [place]

## 2017-10-10 NOTE — Telephone Encounter (Signed)
Dorian Pod from [facility] called and stated that she just had to let one of [place] doctor know that they are resuming nursing for [facility].  She said they would see her for a couple more weeks.  She said pt's PCP is out of town and she just had to let one of [place] doctors know.

## 2017-10-10 NOTE — Addendum Note (Signed)
Addendum  created 10/10/17 0815 by Mickel Baas, CRNA   SmartForm saved

## 2017-10-11 ENCOUNTER — Encounter (HOSPITAL_COMMUNITY): Payer: Self-pay | Admitting: Urology

## 2017-10-17 ENCOUNTER — Other Ambulatory Visit: Payer: Self-pay | Admitting: Urology

## 2017-10-22 ENCOUNTER — Encounter (HOSPITAL_COMMUNITY): Payer: Self-pay

## 2017-10-22 ENCOUNTER — Encounter (HOSPITAL_COMMUNITY)
Admission: RE | Admit: 2017-10-22 | Discharge: 2017-10-22 | Disposition: A | Discharge status: Home / Self Care | Payer: Medicare Other | Source: Ambulatory Visit | Attending: Urology | Admitting: Urology

## 2017-10-23 ENCOUNTER — Ambulatory Visit (HOSPITAL_COMMUNITY)
Admission: RE | Admit: 2017-10-23 | Discharge: 2017-10-23 | Disposition: A | Discharge status: Home / Self Care | Payer: Medicare Other | Source: Ambulatory Visit | Attending: Urology | Admitting: Urology

## 2017-10-23 ENCOUNTER — Encounter (HOSPITAL_COMMUNITY)
Admission: RE | Disposition: A | Discharge status: Home / Self Care | Payer: Self-pay | Source: Ambulatory Visit | Attending: Urology

## 2017-10-23 ENCOUNTER — Encounter (HOSPITAL_COMMUNITY): Payer: Self-pay | Admitting: *Deleted

## 2017-10-23 ENCOUNTER — Ambulatory Visit (HOSPITAL_COMMUNITY): Payer: Medicare Other | Admitting: Anesthesiology

## 2017-10-23 ENCOUNTER — Ambulatory Visit (HOSPITAL_COMMUNITY): Payer: Medicare Other

## 2017-10-23 DIAGNOSIS — Z885 Allergy status to narcotic agent status: Secondary | ICD-10-CM | POA: Diagnosis not present

## 2017-10-23 DIAGNOSIS — Z91040 Latex allergy status: Secondary | ICD-10-CM | POA: Insufficient documentation

## 2017-10-23 DIAGNOSIS — I10 Essential (primary) hypertension: Secondary | ICD-10-CM | POA: Insufficient documentation

## 2017-10-23 DIAGNOSIS — Z881 Allergy status to other antibiotic agents status: Secondary | ICD-10-CM | POA: Diagnosis not present

## 2017-10-23 DIAGNOSIS — Z882 Allergy status to sulfonamides status: Secondary | ICD-10-CM | POA: Insufficient documentation

## 2017-10-23 DIAGNOSIS — M1990 Unspecified osteoarthritis, unspecified site: Secondary | ICD-10-CM | POA: Insufficient documentation

## 2017-10-23 DIAGNOSIS — Z9989 Dependence on other enabling machines and devices: Secondary | ICD-10-CM | POA: Diagnosis not present

## 2017-10-23 DIAGNOSIS — N201 Calculus of ureter: Secondary | ICD-10-CM | POA: Diagnosis not present

## 2017-10-23 DIAGNOSIS — K219 Gastro-esophageal reflux disease without esophagitis: Secondary | ICD-10-CM | POA: Diagnosis not present

## 2017-10-23 DIAGNOSIS — K589 Irritable bowel syndrome without diarrhea: Secondary | ICD-10-CM | POA: Insufficient documentation

## 2017-10-23 DIAGNOSIS — E039 Hypothyroidism, unspecified: Secondary | ICD-10-CM | POA: Insufficient documentation

## 2017-10-23 DIAGNOSIS — G4733 Obstructive sleep apnea (adult) (pediatric): Secondary | ICD-10-CM | POA: Diagnosis not present

## 2017-10-23 DIAGNOSIS — F17210 Nicotine dependence, cigarettes, uncomplicated: Secondary | ICD-10-CM | POA: Insufficient documentation

## 2017-10-23 HISTORY — PX: CYSTOSCOPY WITH RETROGRADE PYELOGRAM, URETEROSCOPY AND STENT PLACEMENT: SHX5789

## 2017-10-23 HISTORY — PX: HOLMIUM LASER APPLICATION: SHX5852

## 2017-10-23 SURGERY — CYSTOURETEROSCOPY, WITH RETROGRADE PYELOGRAM AND STENT INSERTION
Anesthesia: General | Laterality: Left

## 2017-10-23 MED ORDER — FENTANYL CITRATE (PF) 100 MCG/2ML IJ SOLN
INTRAMUSCULAR | Status: AC
  Filled 2017-10-23: qty 2

## 2017-10-23 MED ORDER — SODIUM CHLORIDE 0.9 % IR SOLN
Status: DC | PRN
  Administered 2017-10-23: 3000 mL

## 2017-10-23 MED ORDER — DIATRIZOATE MEGLUMINE 30 % UR SOLN
URETHRAL | Status: AC
  Filled 2017-10-23: qty 100

## 2017-10-23 MED ORDER — PROPOFOL 10 MG/ML IV BOLUS
INTRAVENOUS | Status: DC | PRN
  Administered 2017-10-23: 20 mg via INTRAVENOUS
  Administered 2017-10-23: 180 mg via INTRAVENOUS

## 2017-10-23 MED ORDER — IPRATROPIUM-ALBUTEROL 0.5-2.5 (3) MG/3ML IN SOLN
3.0000 mL | Freq: Four times a day (QID) | RESPIRATORY_TRACT | Status: DC
  Administered 2017-10-23: 3 mL via RESPIRATORY_TRACT

## 2017-10-23 MED ORDER — FENTANYL CITRATE (PF) 100 MCG/2ML IJ SOLN
100.0000 ug | Freq: Once | INTRAMUSCULAR | Status: AC
  Administered 2017-10-23: 100 ug via INTRAVENOUS

## 2017-10-23 MED ORDER — IPRATROPIUM-ALBUTEROL 0.5-2.5 (3) MG/3ML IN SOLN
RESPIRATORY_TRACT | Status: AC
  Filled 2017-10-23: qty 3

## 2017-10-23 MED ORDER — DIATRIZOATE MEGLUMINE 30 % UR SOLN
URETHRAL | Status: DC | PRN
  Administered 2017-10-23: 10 mL

## 2017-10-23 MED ORDER — ONDANSETRON HCL 4 MG/2ML IJ SOLN: 4.0000 mg | Freq: Once | INTRAMUSCULAR | Status: DC | PRN

## 2017-10-23 MED ORDER — HYDROMORPHONE HCL 1 MG/ML IJ SOLN
0.2500 mg | INTRAMUSCULAR | Status: DC | PRN
  Administered 2017-10-23 (×2): 0.5 mg via INTRAVENOUS
  Filled 2017-10-23 (×2): qty 0.5

## 2017-10-23 MED ORDER — OXYCODONE-ACETAMINOPHEN 10-325 MG PO TABS: 1.0000 | ORAL_TABLET | ORAL | 0 refills | Status: DC | PRN

## 2017-10-23 MED ORDER — MEPERIDINE HCL 50 MG/ML IJ SOLN: 6.2500 mg | INTRAMUSCULAR | Status: DC | PRN

## 2017-10-23 MED ORDER — HYDROCODONE-ACETAMINOPHEN 7.5-325 MG PO TABS: 1.0000 | ORAL_TABLET | Freq: Once | ORAL | Status: DC | PRN

## 2017-10-23 MED ORDER — WATER FOR IRRIGATION, STERILE IR SOLN
Status: DC | PRN
  Administered 2017-10-23: 500 mL

## 2017-10-23 MED ORDER — GENTAMICIN SULFATE 40 MG/ML IJ SOLN
5.0000 mg/kg | INTRAVENOUS | Status: AC
  Administered 2017-10-23: 310 mg via INTRAVENOUS
  Filled 2017-10-23: qty 7.75

## 2017-10-23 MED ORDER — LACTATED RINGERS IV SOLN
INTRAVENOUS | Status: DC
  Administered 2017-10-23: 13:00:00 via INTRAVENOUS

## 2017-10-23 MED ORDER — SODIUM CHLORIDE 0.9 % IR SOLN
Status: DC | PRN
  Administered 2017-10-23: 3000 mL via INTRAVESICAL

## 2017-10-23 MED ORDER — EPHEDRINE SULFATE 50 MG/ML IJ SOLN
INTRAMUSCULAR | Status: DC | PRN
  Administered 2017-10-23: 10 mg via INTRAVENOUS

## 2017-10-23 SURGICAL SUPPLY — 25 items
BAG DRAIN URO TABLE W/ADPT NS (BAG) ×2 IMPLANT
CATH INTERMIT  6FR 70CM (CATHETERS) ×2 IMPLANT
CLOTH BEACON ORANGE TIMEOUT ST (SAFETY) ×2 IMPLANT
DECANTER SPIKE VIAL GLASS SM (MISCELLANEOUS) ×2 IMPLANT
EXTRACTOR STONE NITINOL NGAGE (UROLOGICAL SUPPLIES) ×2 IMPLANT
FIBER LASER FLEXIVA 200 (UROLOGICAL SUPPLIES) ×2 IMPLANT
GLOVE BIOGEL PI IND STRL 7.0 (GLOVE) ×1 IMPLANT
GLOVE BIOGEL PI INDICATOR 7.0 (GLOVE) ×1
GLOVE SURG SS PI 7.0 STRL IVOR (GLOVE) ×2 IMPLANT
GLOVE SURG SS PI 8.0 STRL IVOR (GLOVE) ×2 IMPLANT
GOWN STRL REUS W/ TWL XL LVL3 (GOWN DISPOSABLE) ×1 IMPLANT
GOWN STRL REUS W/TWL LRG LVL3 (GOWN DISPOSABLE) ×2 IMPLANT
GOWN STRL REUS W/TWL XL LVL3 (GOWN DISPOSABLE) ×1
GUIDEWIRE STR DUAL SENSOR (WIRE) ×2 IMPLANT
GUIDEWIRE STR ZIPWIRE 035X150 (MISCELLANEOUS) ×2 IMPLANT
IV NS IRRIG 3000ML ARTHROMATIC (IV SOLUTION) ×4 IMPLANT
KIT TURNOVER CYSTO (KITS) ×2 IMPLANT
MANIFOLD NEPTUNE II (INSTRUMENTS) ×2 IMPLANT
PACK CYSTO (CUSTOM PROCEDURE TRAY) ×2 IMPLANT
PAD ARMBOARD 7.5X6 YLW CONV (MISCELLANEOUS) ×2 IMPLANT
SHEATH URETERAL 12FRX35CM (MISCELLANEOUS) ×2 IMPLANT
STENT URET 6FRX26 CONTOUR (STENTS) ×2 IMPLANT
SYR 10ML LL (SYRINGE) ×2 IMPLANT
TOWEL OR 17X26 4PK STRL BLUE (TOWEL DISPOSABLE) ×2 IMPLANT
WATER STERILE IRR 500ML POUR (IV SOLUTION) ×2 IMPLANT

## 2017-10-23 NOTE — OR Nursing (Signed)
Dr. Alyson Ingles in to talk with patient, marked site

## 2017-10-23 NOTE — Anesthesia Postprocedure Evaluation (Signed)
Anesthesia Post Note  Patient: [name]  Procedure(s) Performed: CYSTOSCOPY WITH LEFT RETROGRADE PYELOGRAM, LEFT URETEROSCOPY AND STENT EXCHANGE (Left ) LEFT URETEROSCOPY WITH HOLMIUM LASER LITHOTRIPSY (Left )  Patient location during evaluation: PACU Anesthesia Type: General Level of consciousness: awake, awake and alert and oriented Pain management: pain level controlled Vital Signs Assessment: post-procedure vital signs reviewed and stable Respiratory status: spontaneous breathing, nonlabored ventilation and respiratory function stable Cardiovascular status: blood pressure returned to baseline Postop Assessment: no apparent nausea or vomiting Anesthetic complications: no     Last Vitals:  Vitals:   10/23/17 1445 10/23/17 1500  BP: 124/77 116/71  Pulse: 83 75  Resp: 19 18  Temp: 36.7 C   SpO2: 97% 94%    Last Pain:  Vitals:   10/23/17 1445  TempSrc:   PainSc: 10-Worst pain ever                 Jermaine Neuharth J

## 2017-10-23 NOTE — Interval H&P Note (Signed)
History and Physical Interval Note:  10/23/2017 1:06 PM  [name]  has presented today for surgery, with the diagnosis of LEFT URETERAL STONE  The various methods of treatment have been discussed with the patient and family. After consideration of risks, benefits and other options for treatment, the patient has consented to  Procedure(s): CYSTOSCOPY WITH RETROGRADE PYELOGRAM, URETEROSCOPY AND STENT Exchange (Left) HOLMIUM LASER APPLICATION (Left) as a surgical intervention .  The patient's history has been reviewed, patient examined, no change in status, stable for surgery.  I have reviewed the patient's chart and labs.  Questions were answered to the patient's satisfaction.     Nicolette Bang

## 2017-10-23 NOTE — Transfer of Care (Signed)
Immediate Anesthesia Transfer of Care Note  Patient: [name]  Procedure(s) Performed: CYSTOSCOPY WITH LEFT RETROGRADE PYELOGRAM, LEFT URETEROSCOPY AND STENT EXCHANGE (Left ) LEFT URETEROSCOPY WITH HOLMIUM LASER LITHOTRIPSY (Left )  Patient Location: PACU  Anesthesia Type:General  Level of Consciousness: awake and patient cooperative  Airway & Oxygen Therapy: Patient Spontanous Breathing and Patient connected to face mask oxygen  Post-op Assessment: Report given to RN, Post -op Vital signs reviewed and stable and Patient moving all extremities  Post vital signs: Reviewed and stable  Last Vitals:  Vitals Value Taken Time  BP    Temp    Pulse    Resp    SpO2      Last Pain:  Vitals:   10/23/17 1320  TempSrc:   PainSc: 8       Patients Stated Pain Goal: 5 (34/28/76 8115)  Complications: No apparent anesthesia complications

## 2017-10-23 NOTE — Anesthesia Procedure Notes (Signed)
Procedure Name: LMA Insertion Date/Time: 10/23/2017 1:50 PM Performed by: Vista Deck, CRNA Pre-anesthesia Checklist: Patient identified, Patient being monitored, Emergency Drugs available, Timeout performed and Suction available Patient Re-evaluated:Patient Re-evaluated prior to induction Oxygen Delivery Method: Circle System Utilized Preoxygenation: Pre-oxygenation with 100% oxygen Induction Type: IV induction Ventilation: Mask ventilation without difficulty LMA: LMA inserted LMA Size: 3.0 Number of attempts: 1 Placement Confirmation: positive ETCO2 and breath sounds checked- equal and bilateral Tube secured with: Tape Dental Injury: Teeth and Oropharynx as per pre-operative assessment

## 2017-10-23 NOTE — Anesthesia Preprocedure Evaluation (Addendum)
Anesthesia Evaluation  Patient identified by MRN, date of birth, ID band Patient awake    Reviewed: Allergy & Precautions, H&P , NPO status , Patient's Chart, lab work & pertinent test results, reviewed documented beta blocker date and time   Airway Mallampati: II  TM Distance: >3 FB Neck ROM: full    Dental no notable dental hx. (+) Edentulous Lower, Edentulous Upper   Pulmonary neg pulmonary ROS, sleep apnea and Continuous Positive Airway Pressure Ventilation , Current Smoker,    Pulmonary exam normal breath sounds clear to auscultation       Cardiovascular Exercise Tolerance: Good hypertension, On Medications negative cardio ROS   Rhythm:regular Rate:Normal     Neuro/Psych  Headaches, PSYCHIATRIC DISORDERS Depression negative neurological ROS  negative psych ROS   GI/Hepatic negative GI ROS, Neg liver ROS, GERD  ,  Endo/Other  negative endocrine ROSHypothyroidism   Renal/GU Renal disease     Musculoskeletal  (+) Arthritis ,   Abdominal   Peds  Hematology negative hematology ROS (+)   Anesthesia Other Findings Plt 186, K 3.3 ACD/F with persistent neck pain OSA with CPAP Denies low back sx Renal Dialysis during multi-organ failure "years ago" 360# s/p GBP sx and multiple sx and illnesses now 135#  Preop plan: Duoneb Tx and fentanyl for her c/o pain  Reproductive/Obstetrics negative OB ROS                            Anesthesia Physical Anesthesia Plan  ASA: III  Anesthesia Plan: General   Post-op Pain Management:    Induction:   PONV Risk Score and Plan:   Airway Management Planned:   Additional Equipment:   Intra-op Plan:   Post-operative Plan:   Informed Consent:   Plan Discussed with: CRNA  Anesthesia Plan Comments:         Anesthesia Quick Evaluation

## 2017-10-23 NOTE — Discharge Instructions (Signed)

## 2017-10-23 NOTE — Op Note (Signed)
.  Preoperative diagnosis: Left ureteral stone  Postoperative diagnosis: Same  Procedure: 1 cystoscopy 2. Left retrograde pyelography 3.  Intraoperative fluoroscopy, under one hour, with interpretation 4.  Left ureteroscopic stone manipulation with laser lithotripsy 5.  Left 6 x 26 JJ stent exchange  Attending: Rosie Fate  Anesthesia: General  Estimated blood loss: None  Drains: Left 6 x 26 JJ ureteral stent with tether  Specimens: stone for analysis  Antibiotics: gentamycin  Findings: left renal pelvis stone. no hydronephrosis. No masses/lesions in the bladder. Ureteral orifices in normal anatomic location.  Indications: Patient is a 57 year old female with a history of left ureteral stone and who underwent stent placement 2 weeks ago for intractable pain  After discussing treatment options, she decided proceed with left ureteroscopic stone manipulation.  Procedure her in detail: The patient was brought to the operating room and a brief timeout was done to ensure correct patient, correct procedure, correct site.  General anesthesia was administered patient was placed in dorsal lithotomy position.  Her genitalia was then prepped and draped in usual sterile fashion.  A rigid 62 French cystoscope was passed in the urethra and the bladder.  Bladder was inspected free masses or lesions.  the ureteral orifices were in the normal orthotopic locations. Using a grasper the stent was brought to the urethral meatus. A zipwire was advanced through the stent and up to the renal pelvis. The stent was then removed.  a 6 french ureteral catheter was then instilled into the left ureteral orifice.  a gentle retrograde was obtained and findings noted above. we then removed the cystoscope and cannulated the left ureteral orifice with a semirigid ureteroscope.  No stone was found in the ureter. Once we reached the UPJ a sensor wire was advanced in to the renal pelvis. We then removed the ureteroscope and  advanced 12/14 x 35cm access sheath up to the renal pelvis. We then used a flexible ureteroscope to perform nephroscopy. We encountered the stone in the renal pelvis/UPJ.   using a 200 nm laser fiber and fragmented the stone into smaller pieces.  the pieces were then removed with a Ngage basket.  Once the pieces were removed the removed the access sheath under direct vision and noted no injury to the ureter. we then placed a 6 x 26 double-j ureteral stent over the original zip wire.   We then removed the wire and good coil was noted in the the renal pelvis under fluoroscopy and the bladder under direct vision.  the bladder and sent for analysis.   the bladder was then drained and this concluded the procedure which was well tolerated by patient.  Complications: None  Condition: Stable, extubated, transferred to PACU  Plan: Patient is to be discharged home as to follow-up in 2 weeks. She is to remove her stent by pulling the tether in 72 hours

## 2017-10-24 ENCOUNTER — Encounter (HOSPITAL_COMMUNITY): Payer: Self-pay | Admitting: Urology

## 2017-10-30 ENCOUNTER — Ambulatory Visit (INDEPENDENT_AMBULATORY_CARE_PROVIDER_SITE_OTHER): Payer: Medicare Other | Admitting: Urology

## 2017-10-30 DIAGNOSIS — N201 Calculus of ureter: Secondary | ICD-10-CM | POA: Diagnosis not present

## 2017-11-01 LAB — STONE ANALYSIS
CA OXALATE, DIHYDRATE: 10 %
CA OXALATE, MONOHYDR.: 70 %
Ca phos cry stone ql IR: 20 %
STONE WEIGHT KSTONE: 95.7 mg

## 2017-11-07 ENCOUNTER — Telehealth: Payer: Self-pay | Admitting: Orthopaedic Surgery

## 2017-11-07 NOTE — Telephone Encounter (Signed)
Patient requests refill on Oxycodone/Acetaminophen 10/325 mgs.  Qty  170  Sig: One every four hours as needed for pain. Must last 30 days.  Patient states she uses [name] in [place]

## 2017-11-07 NOTE — Telephone Encounter (Signed)
She got narcotics on 7-28 and 7-31 from doctor in [place].  I cannot give new Rx.

## 2017-11-12 ENCOUNTER — Encounter: Payer: Self-pay | Admitting: Orthopaedic Surgery

## 2017-11-12 ENCOUNTER — Ambulatory Visit (INDEPENDENT_AMBULATORY_CARE_PROVIDER_SITE_OTHER): Payer: Medicare Other | Admitting: Orthopaedic Surgery

## 2017-11-12 VITALS — BP 133/69 | HR 72 | Ht 61.0 in | Wt 137.0 lb

## 2017-11-12 DIAGNOSIS — G894 Chronic pain syndrome: Secondary | ICD-10-CM

## 2017-11-12 DIAGNOSIS — G8929 Other chronic pain: Secondary | ICD-10-CM

## 2017-11-12 DIAGNOSIS — M25512 Pain in left shoulder: Secondary | ICD-10-CM | POA: Diagnosis not present

## 2017-11-12 DIAGNOSIS — F17210 Nicotine dependence, cigarettes, uncomplicated: Secondary | ICD-10-CM

## 2017-11-12 MED ORDER — OXYCODONE-ACETAMINOPHEN 10-325 MG PO TABS: 1.0000 | ORAL_TABLET | ORAL | 0 refills | Status: DC | PRN

## 2017-11-12 NOTE — Progress Notes (Signed)
PROCEDURE NOTE:  The patient request injection, verbal consent was obtained.  The left shoulder was prepped appropriately after time out was performed.   Sterile technique was observed and injection of 1 cc of Depo-Medrol 40 mg with several cc's of plain xylocaine. Anesthesia was provided by ethyl chloride and a 20-gauge needle was used to inject the shoulder area. A posterior approach was used.  The injection was tolerated well.  A band aid dressing was applied.  The patient was advised to apply ice later today and tomorrow to the injection sight as needed.  Return in one month.  I have reviewed the [place] web site prior to prescribing narcotic medicine for this patient. The patient has read and signed an Opioid Treatment Agreement which has been scanned and added to the medical record.  The patient understands the agreement and agrees to abide with it.  The patient has chronic pain that is being treated with an opioid which relieves the pain.  The patient understands potential complications with chronic opioid treatment.  Electronically Signed Sanjuana Kava, MD 8/13/201911:05 AM

## 2017-11-29 ENCOUNTER — Other Ambulatory Visit: Payer: Self-pay | Admitting: Urology

## 2017-11-29 DIAGNOSIS — N201 Calculus of ureter: Secondary | ICD-10-CM

## 2017-12-03 ENCOUNTER — Telehealth: Payer: Self-pay | Admitting: Orthopaedic Surgery

## 2017-12-03 NOTE — Telephone Encounter (Signed)
No

## 2017-12-03 NOTE — Telephone Encounter (Signed)
[name] called and stated that she has been told that she has a fracture in her neck and this is possibly some of the reason she has been having so much pain.  She is awaiting MRI and further evaluation. She states that due to the increased pain of the neck and the problems that she has had with her kidneys, there has been times where she has had to take 2 tablets instead of one tablet.  She says this has made her medication not last as long.  She has called today asking if Dr. Luna Glasgow will refill her prescription earlier.  I told her that the only thing I could do is ask Dr. Luna Glasgow.  Patient requests refill on Oxycodone/Acetaminophen 10-325 mgs.  Qty  170  Sig: Take 1 tablet by mouth every 4 (four) hours as needed for pain.  Patient states she uses [name].

## 2017-12-06 ENCOUNTER — Ambulatory Visit (HOSPITAL_COMMUNITY)
Admission: RE | Admit: 2017-12-06 | Discharge: 2017-12-06 | Disposition: A | Discharge status: Home / Self Care | Payer: Medicare Other | Source: Ambulatory Visit | Attending: Urology | Admitting: Urology

## 2017-12-06 DIAGNOSIS — N201 Calculus of ureter: Secondary | ICD-10-CM

## 2017-12-06 DIAGNOSIS — N281 Cyst of kidney, acquired: Secondary | ICD-10-CM | POA: Insufficient documentation

## 2017-12-10 ENCOUNTER — Encounter: Payer: Self-pay | Admitting: Orthopaedic Surgery

## 2017-12-10 ENCOUNTER — Ambulatory Visit (INDEPENDENT_AMBULATORY_CARE_PROVIDER_SITE_OTHER): Payer: Medicare Other | Admitting: Orthopaedic Surgery

## 2017-12-10 ENCOUNTER — Telehealth: Payer: Self-pay | Admitting: Radiology

## 2017-12-10 VITALS — BP 103/68 | HR 114 | Ht 61.0 in | Wt 137.0 lb

## 2017-12-10 DIAGNOSIS — F17210 Nicotine dependence, cigarettes, uncomplicated: Secondary | ICD-10-CM

## 2017-12-10 DIAGNOSIS — G894 Chronic pain syndrome: Secondary | ICD-10-CM

## 2017-12-10 DIAGNOSIS — M25512 Pain in left shoulder: Secondary | ICD-10-CM | POA: Diagnosis not present

## 2017-12-10 DIAGNOSIS — G8929 Other chronic pain: Secondary | ICD-10-CM

## 2017-12-10 MED ORDER — OXYCODONE HCL 15 MG PO TABS: 15.0000 mg | ORAL_TABLET | Freq: Four times a day (QID) | ORAL | 0 refills | Status: DC | PRN

## 2017-12-10 NOTE — Telephone Encounter (Signed)
09/24/17 the opioid agreement is scanned incorrectly, not showing on file, can you correct?

## 2017-12-10 NOTE — Progress Notes (Signed)
PROCEDURE NOTE:  The patient request injection, verbal consent was obtained.  The left shoulder was prepped appropriately after time out was performed.   Sterile technique was observed and injection of 1 cc of Depo-Medrol 40 mg with several cc's of plain xylocaine. Anesthesia was provided by ethyl chloride and a 20-gauge needle was used to inject the shoulder area. A posterior approach was used.  The injection was tolerated well.  A band aid dressing was applied.  The patient was advised to apply ice later today and tomorrow to the injection sight as needed.  She has significant chronic pain.  I will change to Roxicodone 15 one every six hours.  She has Narcan nasal spray at home.  The patient has read and signed an Opioid Treatment Agreement which has been scanned and added to the medical record.  The patient understands the agreement and agrees to abide with it.  The patient has chronic pain that is being treated with an opioid which relieves the pain.  The patient understands potential complications with chronic opioid treatment.   I have reviewed the [place] web site prior to prescribing narcotic medicine for this patient.    I will see her in one month.  Electronically Signed Sanjuana Kava, MD 9/10/20193:21 PM

## 2017-12-11 ENCOUNTER — Ambulatory Visit (INDEPENDENT_AMBULATORY_CARE_PROVIDER_SITE_OTHER): Payer: Medicare Other | Admitting: Urology

## 2017-12-11 DIAGNOSIS — N201 Calculus of ureter: Secondary | ICD-10-CM | POA: Diagnosis not present

## 2017-12-11 DIAGNOSIS — R3915 Urgency of urination: Secondary | ICD-10-CM | POA: Diagnosis not present

## 2017-12-12 ENCOUNTER — Telehealth: Payer: Self-pay | Admitting: Orthopaedic Surgery

## 2017-12-12 NOTE — Telephone Encounter (Signed)
Patient called saying that the pain medication that you changed is not working. Stated it is suppose to be stronger and she is taking 1 every 6 hours as prescribed, but by the third hour she is crawling back in bed in severe pain. She is asking if you could change how she is taking it.  Please advise

## 2017-12-16 NOTE — Telephone Encounter (Signed)
It IS stronger, much stronger.    I can change but only after the month is over.  She needs to go to pain clinic.  She is not responding appropriately to the medicine.

## 2017-12-19 NOTE — Telephone Encounter (Signed)
Patient given this information. She does not voice understanding.

## 2017-12-25 ENCOUNTER — Telehealth: Payer: Self-pay | Admitting: Orthopaedic Surgery

## 2017-12-25 MED ORDER — TIZANIDINE HCL 4 MG PO TABS: ORAL_TABLET | ORAL | 3 refills | Status: DC

## 2017-12-25 NOTE — Telephone Encounter (Signed)
Brock called and wanted to know if you would give her a prescription for Tizanidine(Zanaflex) 4 mgs. For muscle spasms.  Sig: Take 4 mg by mouth 3 (three) times daily as needed for muscle spasms.   She uses [name]

## 2018-01-09 ENCOUNTER — Ambulatory Visit (INDEPENDENT_AMBULATORY_CARE_PROVIDER_SITE_OTHER): Payer: Medicare Other | Admitting: Orthopaedic Surgery

## 2018-01-09 ENCOUNTER — Encounter: Payer: Self-pay | Admitting: Orthopaedic Surgery

## 2018-01-09 VITALS — BP 108/65 | HR 65 | Ht 61.0 in | Wt 137.0 lb

## 2018-01-09 DIAGNOSIS — G8929 Other chronic pain: Secondary | ICD-10-CM | POA: Diagnosis not present

## 2018-01-09 DIAGNOSIS — G894 Chronic pain syndrome: Secondary | ICD-10-CM

## 2018-01-09 DIAGNOSIS — M25512 Pain in left shoulder: Secondary | ICD-10-CM

## 2018-01-09 DIAGNOSIS — F17210 Nicotine dependence, cigarettes, uncomplicated: Secondary | ICD-10-CM

## 2018-01-09 MED ORDER — OXYCODONE HCL 15 MG PO TABS: ORAL_TABLET | ORAL | 0 refills | Status: DC

## 2018-01-09 NOTE — Progress Notes (Signed)
PROCEDURE NOTE:  The patient request injection, verbal consent was obtained.  The left shoulder was prepped appropriately after time out was performed.   Sterile technique was observed and injection of 1 cc of Depo-Medrol 40 mg with several cc's of plain xylocaine. Anesthesia was provided by ethyl chloride and a 20-gauge needle was used to inject the shoulder area. A posterior approach was used.  The injection was tolerated well.  A band aid dressing was applied.  The patient was advised to apply ice later today and tomorrow to the injection sight as needed.  The patient has read and signed an Opioid Treatment Agreement which has been scanned and added to the medical record.  The patient understands the agreement and agrees to abide with it.  The patient has chronic pain that is being treated with an opioid which relieves the pain.  The patient understands potential complications with chronic opioid treatment.   I have reviewed the [place] web site prior to prescribing narcotic medicine for this patient.   Electronically Signed Sanjuana Kava, MD 10/10/201911:03 AM

## 2018-01-09 NOTE — Patient Instructions (Signed)
Steps to Quit Smoking Smoking tobacco can be bad for your health. It can also affect almost every organ in your body. Smoking puts you and people around you at risk for many serious long-lasting (chronic) diseases. Quitting smoking is hard, but it is one of the best things that you can do for your health. It is never too late to quit. What are the benefits of quitting smoking? When you quit smoking, you lower your risk for getting serious diseases and conditions. They can include:  Lung cancer or lung disease.  Heart disease.  Stroke.  Heart attack.  Not being able to have children (infertility).  Weak bones (osteoporosis) and broken bones (fractures).  If you have coughing, wheezing, and shortness of breath, those symptoms may get better when you quit. You may also get sick less often. If you are pregnant, quitting smoking can help to lower your chances of having a baby of low birth weight. What can I do to help me quit smoking? Talk with your doctor about what can help you quit smoking. Some things you can do (strategies) include:  Quitting smoking totally, instead of slowly cutting back how much you smoke over a period of time.  Going to in-person counseling. You are more likely to quit if you go to many counseling sessions.  Using resources and support systems, such as: ? Online chats with a counselor. ? Phone quitlines. ? Printed self-help materials. ? Support groups or group counseling. ? Text messaging programs. ? Mobile phone apps or applications.  Taking medicines. Some of these medicines may have nicotine in them. If you are pregnant or breastfeeding, do not take any medicines to quit smoking unless your doctor says it is okay. Talk with your doctor about counseling or other things that can help you.  Talk with your doctor about using more than one strategy at the same time, such as taking medicines while you are also going to in-person counseling. This can help make  quitting easier. What things can I do to make it easier to quit? Quitting smoking might feel very hard at first, but there is a lot that you can do to make it easier. Take these steps:  Talk to your family and friends. Ask them to support and encourage you.  Call phone quitlines, reach out to support groups, or work with a counselor.  Ask people who smoke to not smoke around you.  Avoid places that make you want (trigger) to smoke, such as: ? Bars. ? Parties. ? Smoke-break areas at work.  Spend time with people who do not smoke.  Lower the stress in your life. Stress can make you want to smoke. Try these things to help your stress: ? Getting regular exercise. ? Deep-breathing exercises. ? Yoga. ? Meditating. ? Doing a body scan. To do this, close your eyes, focus on one area of your body at a time from head to toe, and notice which parts of your body are tense. Try to relax the muscles in those areas.  Download or buy apps on your mobile phone or tablet that can help you stick to your quit plan. There are many free apps, such as QuitGuide from the CDC (Centers for Disease Control and Prevention). You can find more support from smokefree.gov and other websites.  This information is not intended to replace advice given to you by your health care provider. Make sure you discuss any questions you have with your health care provider. Document Released: 01/13/2009 Document   Revised: 11/15/2015 Document Reviewed: 08/03/2014 Elsevier Interactive Patient Education  2018 Elsevier Inc.  

## 2018-02-06 ENCOUNTER — Ambulatory Visit (INDEPENDENT_AMBULATORY_CARE_PROVIDER_SITE_OTHER): Payer: Medicare Other | Admitting: Orthopaedic Surgery

## 2018-02-06 ENCOUNTER — Encounter: Payer: Self-pay | Admitting: Orthopaedic Surgery

## 2018-02-06 DIAGNOSIS — F17210 Nicotine dependence, cigarettes, uncomplicated: Secondary | ICD-10-CM

## 2018-02-06 DIAGNOSIS — G8929 Other chronic pain: Secondary | ICD-10-CM | POA: Diagnosis not present

## 2018-02-06 DIAGNOSIS — M25512 Pain in left shoulder: Secondary | ICD-10-CM | POA: Diagnosis not present

## 2018-02-06 DIAGNOSIS — G894 Chronic pain syndrome: Secondary | ICD-10-CM

## 2018-02-06 MED ORDER — OXYCODONE HCL 15 MG PO TABS: ORAL_TABLET | ORAL | 0 refills | Status: DC

## 2018-02-06 NOTE — Progress Notes (Signed)
PROCEDURE NOTE:  The patient request injection, verbal consent was obtained.  The left shoulder was prepped appropriately after time out was performed.   Sterile technique was observed and injection of 1 cc of Depo-Medrol 40 mg with several cc's of plain xylocaine. Anesthesia was provided by ethyl chloride and a 20-gauge needle was used to inject the shoulder area. A posterior approach was used.  The injection was tolerated well.  A band aid dressing was applied.  The patient was advised to apply ice later today and tomorrow to the injection sight as needed.  I have reviewed the [place] web site prior to prescribing narcotic medicine for this patient.   The patient has read and signed an Opioid Treatment Agreement which has been scanned and added to the medical record.  The patient understands the agreement and agrees to abide with it.  The patient has chronic pain that is being treated with an opioid which relieves the pain.  The patient understands potential complications with chronic opioid treatment.   Return in one month.  Electronically Signed Sanjuana Kava, MD 11/7/201910:21 AM

## 2018-03-06 ENCOUNTER — Ambulatory Visit (INDEPENDENT_AMBULATORY_CARE_PROVIDER_SITE_OTHER): Payer: Medicare Other | Admitting: Orthopaedic Surgery

## 2018-03-06 ENCOUNTER — Encounter: Payer: Self-pay | Admitting: Orthopaedic Surgery

## 2018-03-06 VITALS — BP 133/59 | HR 74 | Ht 61.0 in | Wt 137.0 lb

## 2018-03-06 DIAGNOSIS — G8929 Other chronic pain: Secondary | ICD-10-CM

## 2018-03-06 DIAGNOSIS — G894 Chronic pain syndrome: Secondary | ICD-10-CM

## 2018-03-06 DIAGNOSIS — F17210 Nicotine dependence, cigarettes, uncomplicated: Secondary | ICD-10-CM | POA: Diagnosis not present

## 2018-03-06 DIAGNOSIS — M25512 Pain in left shoulder: Secondary | ICD-10-CM | POA: Diagnosis not present

## 2018-03-06 MED ORDER — OXYCODONE HCL 15 MG PO TABS: ORAL_TABLET | ORAL | 0 refills | Status: DC

## 2018-03-06 NOTE — Progress Notes (Signed)
PROCEDURE NOTE:  The patient request injection, verbal consent was obtained.  The left shoulder was prepped appropriately after time out was performed.   Sterile technique was observed and injection of 1 cc of Depo-Medrol 40 mg with several cc's of plain xylocaine. Anesthesia was provided by ethyl chloride and a 20-gauge needle was used to inject the shoulder area. A posterior approach was used.  The injection was tolerated well.  A band aid dressing was applied.  The patient was advised to apply ice later today and tomorrow to the injection sight as needed.  The patient has read and signed an Opioid Treatment Agreement which has been scanned and added to the medical record.  The patient understands the agreement and agrees to abide with it.  The patient has chronic pain that is being treated with an opioid which relieves the pain.  The patient understands potential complications with chronic opioid treatment.   I have reviewed the [place] web site prior to prescribing narcotic medicine for this patient.   Electronically Signed Sanjuana Kava, MD 12/5/201910:41 AM

## 2018-04-03 ENCOUNTER — Ambulatory Visit (INDEPENDENT_AMBULATORY_CARE_PROVIDER_SITE_OTHER): Payer: Medicare Other | Admitting: Orthopaedic Surgery

## 2018-04-03 ENCOUNTER — Encounter: Payer: Self-pay | Admitting: Orthopaedic Surgery

## 2018-04-03 DIAGNOSIS — G894 Chronic pain syndrome: Secondary | ICD-10-CM

## 2018-04-03 DIAGNOSIS — G8929 Other chronic pain: Secondary | ICD-10-CM

## 2018-04-03 DIAGNOSIS — M25512 Pain in left shoulder: Secondary | ICD-10-CM

## 2018-04-03 DIAGNOSIS — F17210 Nicotine dependence, cigarettes, uncomplicated: Secondary | ICD-10-CM

## 2018-04-03 MED ORDER — OXYCODONE HCL 15 MG PO TABS: ORAL_TABLET | ORAL | 0 refills | Status: DC

## 2018-04-03 NOTE — Patient Instructions (Signed)
Steps to Quit Smoking    Smoking tobacco can be bad for your health. It can also affect almost every organ in your body. Smoking puts you and people around you at risk for many serious long-lasting (chronic) diseases. Quitting smoking is hard, but it is one of the best things that you can do for your health. It is never too late to quit.  What are the benefits of quitting smoking?  When you quit smoking, you lower your risk for getting serious diseases and conditions. They can include:  · Lung cancer or lung disease.  · Heart disease.  · Stroke.  · Heart attack.  · Not being able to have children (infertility).  · Weak bones (osteoporosis) and broken bones (fractures).  If you have coughing, wheezing, and shortness of breath, those symptoms may get better when you quit. You may also get sick less often. If you are pregnant, quitting smoking can help to lower your chances of having a baby of low birth weight.  What can I do to help me quit smoking?  Talk with your doctor about what can help you quit smoking. Some things you can do (strategies) include:  · Quitting smoking totally, instead of slowly cutting back how much you smoke over a period of time.  · Going to in-person counseling. You are more likely to quit if you go to many counseling sessions.  · Using resources and support systems, such as:  ? Online chats with a counselor.  ? Phone quitlines.  ? Printed self-help materials.  ? Support groups or group counseling.  ? Text messaging programs.  ? Mobile phone apps or applications.  · Taking medicines. Some of these medicines may have nicotine in them. If you are pregnant or breastfeeding, do not take any medicines to quit smoking unless your doctor says it is okay. Talk with your doctor about counseling or other things that can help you.  Talk with your doctor about using more than one strategy at the same time, such as taking medicines while you are also going to in-person counseling. This can help make  quitting easier.  What things can I do to make it easier to quit?  Quitting smoking might feel very hard at first, but there is a lot that you can do to make it easier. Take these steps:  · Talk to your family and friends. Ask them to support and encourage you.  · Call phone quitlines, reach out to support groups, or work with a counselor.  · Ask people who smoke to not smoke around you.  · Avoid places that make you want (trigger) to smoke, such as:  ? Bars.  ? Parties.  ? Smoke-break areas at work.  · Spend time with people who do not smoke.  · Lower the stress in your life. Stress can make you want to smoke. Try these things to help your stress:  ? Getting regular exercise.  ? Deep-breathing exercises.  ? Yoga.  ? Meditating.  ? Doing a body scan. To do this, close your eyes, focus on one area of your body at a time from head to toe, and notice which parts of your body are tense. Try to relax the muscles in those areas.  · Download or buy apps on your mobile phone or tablet that can help you stick to your quit plan. There are many free apps, such as QuitGuide from the CDC (Centers for Disease Control and Prevention). You can find more   support from smokefree.gov and other websites.  This information is not intended to replace advice given to you by your health care provider. Make sure you discuss any questions you have with your health care provider.  Document Released: 01/13/2009 Document Revised: 11/15/2015 Document Reviewed: 08/03/2014  Elsevier Interactive Patient Education © 2019 Elsevier Inc.

## 2018-04-03 NOTE — Progress Notes (Signed)
PROCEDURE NOTE:  The patient request injection, verbal consent was obtained.  The left shoulder was prepped appropriately after time out was performed.   Sterile technique was observed and injection of 1 cc of Depo-Medrol 40 mg with several cc's of plain xylocaine. Anesthesia was provided by ethyl chloride and a 20-gauge needle was used to inject the shoulder area. A posterior approach was used.  The injection was tolerated well.  A band aid dressing was applied.  The patient was advised to apply ice later today and tomorrow to the injection sight as needed.  The patient has read and signed an Opioid Treatment Agreement which has been scanned and added to the medical record.  The patient understands the agreement and agrees to abide with it.  The patient has chronic pain that is being treated with an opioid which relieves the pain.  The patient understands potential complications with chronic opioid treatment.   I have reviewed the [place] web site prior to prescribing narcotic medicine for this patient.   Return in one month.  Electronically Signed Sanjuana Kava, MD 1/2/202010:28 AM

## 2018-05-01 ENCOUNTER — Ambulatory Visit: Payer: Medicare Other | Admitting: Orthopaedic Surgery

## 2018-05-06 ENCOUNTER — Encounter: Payer: Self-pay | Admitting: Orthopaedic Surgery

## 2018-05-06 ENCOUNTER — Ambulatory Visit (INDEPENDENT_AMBULATORY_CARE_PROVIDER_SITE_OTHER): Payer: Medicare Other | Admitting: Orthopaedic Surgery

## 2018-05-06 DIAGNOSIS — F17210 Nicotine dependence, cigarettes, uncomplicated: Secondary | ICD-10-CM

## 2018-05-06 DIAGNOSIS — G894 Chronic pain syndrome: Secondary | ICD-10-CM

## 2018-05-06 DIAGNOSIS — G8929 Other chronic pain: Secondary | ICD-10-CM | POA: Diagnosis not present

## 2018-05-06 DIAGNOSIS — M25512 Pain in left shoulder: Secondary | ICD-10-CM | POA: Diagnosis not present

## 2018-05-06 MED ORDER — OXYCODONE HCL 15 MG PO TABS: ORAL_TABLET | ORAL | 0 refills | Status: DC

## 2018-05-06 NOTE — Progress Notes (Signed)
PROCEDURE NOTE:  The patient request injection, verbal consent was obtained.  The left shoulder was prepped appropriately after time out was performed.   Sterile technique was observed and injection of 1 cc of Depo-Medrol 40 mg with several cc's of plain xylocaine. Anesthesia was provided by ethyl chloride and a 20-gauge needle was used to inject the shoulder area. A posterior approach was used.  The injection was tolerated well.  A band aid dressing was applied.  The patient was advised to apply ice later today and tomorrow to the injection sight as needed.  The patient has read and signed an Opioid Treatment Agreement which has been scanned and added to the medical record.  The patient understands the agreement and agrees to abide with it.  The patient has chronic pain that is being treated with an opioid which relieves the pain.  The patient understands potential complications with chronic opioid treatment.   I have reviewed the [place] web site prior to prescribing narcotic medicine for this patient.   Return in one month.  Rx given for cervical soft collar.  Call if any problem.  Precautions discussed.   Electronically Signed Sanjuana Kava, MD 2/4/202010:43 AM

## 2018-05-13 ENCOUNTER — Other Ambulatory Visit (HOSPITAL_COMMUNITY): Payer: Self-pay | Admitting: Urology

## 2018-05-13 DIAGNOSIS — N201 Calculus of ureter: Secondary | ICD-10-CM

## 2018-06-03 ENCOUNTER — Telehealth: Payer: Self-pay | Admitting: Orthopaedic Surgery

## 2018-06-03 ENCOUNTER — Ambulatory Visit: Payer: Medicare Other | Admitting: Orthopaedic Surgery

## 2018-06-03 MED ORDER — OXYCODONE HCL 15 MG PO TABS: ORAL_TABLET | ORAL | 0 refills | Status: DC

## 2018-06-03 NOTE — Telephone Encounter (Signed)
Patient requests refill on Oxycodone 5 mgs.  Qty 140  Sig: Take one tablet by mouth every five hours as needed for pain.        Patient states she uses [name]

## 2018-06-26 ENCOUNTER — Other Ambulatory Visit: Payer: Self-pay | Admitting: Orthopaedic Surgery

## 2018-07-01 ENCOUNTER — Telehealth: Payer: Self-pay | Admitting: Orthopaedic Surgery

## 2018-07-01 ENCOUNTER — Other Ambulatory Visit: Payer: Self-pay | Admitting: Orthopaedic Surgery

## 2018-07-01 NOTE — Telephone Encounter (Signed)
Patient requests refill on Oxycodone 15 mgs.  Qty  140  Sig: Take one tablet by mouth every five hours as needed for pain.  Patient states she uses [name]

## 2018-07-03 ENCOUNTER — Ambulatory Visit: Payer: Medicare Other | Admitting: Orthopaedic Surgery

## 2018-07-28 ENCOUNTER — Encounter: Payer: Self-pay | Admitting: *Deleted

## 2018-07-28 ENCOUNTER — Telehealth: Payer: Self-pay | Admitting: Cardiology

## 2018-07-28 NOTE — Telephone Encounter (Signed)
Pt is having problems w/ swelling, please give her a call [phone]

## 2018-07-28 NOTE — Telephone Encounter (Signed)
Requesting an appointment for swelling in both extemities. Patient says she has been in the hospital at [facility] multiple times since last appointment and stress test was done at that time-records requested. Baseline weight 132 lbs and now 162 lbs. Currently taking furosemide 40 mg in am and 20 mg in the pm prescribed by [name] PA. Patient also c/o intermittent, heavy alternating with sharp chest pain rated 10/10 for the past year and a half and says its that has gotten worse in the past 4 months. Not having active chest pain. Also c/o dizziness early in the mornings. Patient is asking to be seen by the cardiologist since she has a strong family h/o heart disease. Advised that message would be sent to provider for recommendations and if her symptoms get worse, to go to the ED for an evaluation. Verbalized understanding.

## 2018-07-29 ENCOUNTER — Telehealth: Payer: Self-pay | Admitting: Orthopaedic Surgery

## 2018-07-29 MED ORDER — OXYCODONE HCL 15 MG PO TABS: ORAL_TABLET | ORAL | 0 refills | Status: DC

## 2018-07-29 NOTE — Telephone Encounter (Signed)
Patient informed and verbalized understanding of plan. 

## 2018-07-29 NOTE — Telephone Encounter (Signed)
Can we do a virtual visit 430pm on APril 30th to start. If weight has not been coming down on lasix 40mg  in AM and 20mg  in PM increase to 40mg  bid until our appt   Zandra Abts MD

## 2018-07-29 NOTE — Telephone Encounter (Signed)
Oxycodone 15 MG Qty  140 Tablets  PATIENT USES [facility]  Patient states she will run out this weekend and her appointment is 5/5. She knows she is requesting this refill early and states her pharmacist will hold it.

## 2018-07-30 ENCOUNTER — Telehealth: Payer: Self-pay | Admitting: Cardiology

## 2018-07-30 NOTE — Telephone Encounter (Signed)
Virtual Visit Pre-Appointment Phone Call  "(Name), I am calling you today to discuss your upcoming appointment. We are currently trying to limit exposure to the virus that causes COVID-19 by seeing patients at home rather than in the office."  1. "What is the BEST phone number to call the day of the visit?" - include this in appointment notes  2. Do you have or have access to (through a family member/friend) a smartphone with video capability that we can use for your visit?" a. If yes - list this number in appt notes as cell (if different from BEST phone #) and list the appointment type as a VIDEO visit in appointment notes b. If no - list the appointment type as a PHONE visit in appointment notes  3. Confirm consent - "In the setting of the current Covid19 crisis, you are scheduled for a (phone or video) visit with your provider on (date) at (time).  Just as we do with many in-office visits, in order for you to participate in this visit, we must obtain consent.  If you'd like, I can send this to your mychart (if signed up) or email for you to review.  Otherwise, I can obtain your verbal consent now.  All virtual visits are billed to your insurance company just like a normal visit would be.  By agreeing to a virtual visit, we'd like you to understand that the technology does not allow for your provider to perform an examination, and thus may limit your provider's ability to fully assess your condition. If your provider identifies any concerns that need to be evaluated in person, we will make arrangements to do so.  Finally, though the technology is pretty good, we cannot assure that it will always work on either your or our end, and in the setting of a video visit, we may have to convert it to a phone-only visit.  In either situation, we cannot ensure that we have a secure connection.  Are you willing to proceed?" STAFF: Did the patient verbally acknowledge consent to telehealth visit? Document  YES/NO here: YES  4. Advise patient to be prepared - "Two hours prior to your appointment, go ahead and check your blood pressure, pulse, oxygen saturation, and your weight (if you have the equipment to check those) and write them all down. When your visit starts, your provider will ask you for this information. If you have an Apple Watch or Kardia device, please plan to have heart rate information ready on the day of your appointment. Please have a pen and paper handy nearby the day of the visit as well."  5. Give patient instructions for MyChart download to smartphone OR Doximity/Doxy.me as below if video visit (depending on what platform provider is using)  6. Inform patient they will receive a phone call 15 minutes prior to their appointment time (may be from unknown caller ID) so they should be prepared to answer    [place] has been deemed a candidate for a follow-up tele-health visit to limit community exposure during the Covid-19 pandemic. I spoke with the patient via phone to ensure availability of phone/video source, confirm preferred email & phone number, and discuss instructions and expectations.  I reminded [name] to be prepared with any vital sign and/or heart rhythm information that could potentially be obtained via home monitoring, at the time of her visit. I reminded [name] to expect a phone call prior to  her visit.  [name] 07/30/2018 11:08 AM   INSTRUCTIONS FOR DOWNLOADING THE MYCHART APP TO SMARTPHONE  - The patient must first make sure to have activated MyChart and know their login information - If Apple, go to [employer] and type in MyChart in the search bar and download the app. If Android, ask patient to go to [employer] and type in [place] in the search bar and download the app. The app is free but as with any other app downloads, their phone may require them to verify saved payment information or  Apple/Android password.  - The patient will need to then log into the app with their MyChart username and password, and select [facility] as their healthcare provider to link the account. When it is time for your visit, go to the MyChart app, find appointments, and click Begin Video Visit. Be sure to Select Allow for your device to access the Microphone and Camera for your visit. You will then be connected, and your provider will be with you shortly.  **If they have any issues connecting, or need assistance please contact MyChart service desk [phone])**  **If using a computer, in order to ensure the best quality for their visit they will need to use either of the following Internet Browsers: [employer], or Google Chrome**  IF USING DOXIMITY or DOXY.ME - The patient will receive a link just prior to their visit by text.     FULL LENGTH CONSENT FOR TELE-HEALTH VISIT   I hereby voluntarily request, consent and authorize [facility] and its employed or contracted physicians, physician assistants, nurse practitioners or other licensed health care professionals (the Practitioner), to provide me with telemedicine health care services (the Services") as deemed necessary by the treating Practitioner. I acknowledge and consent to receive the Services by the Practitioner via telemedicine. I understand that the telemedicine visit will involve communicating with the Practitioner through live audiovisual communication technology and the disclosure of certain medical information by electronic transmission. I acknowledge that I have been given the opportunity to request an in-person assessment or other available alternative prior to the telemedicine visit and am voluntarily participating in the telemedicine visit.  I understand that I have the right to withhold or withdraw my consent to the use of telemedicine in the course of my care at any time, without affecting my right to future care  or treatment, and that the Practitioner or I may terminate the telemedicine visit at any time. I understand that I have the right to inspect all information obtained and/or recorded in the course of the telemedicine visit and may receive copies of available information for a reasonable fee.  I understand that some of the potential risks of receiving the Services via telemedicine include:   Delay or interruption in medical evaluation due to technological equipment failure or disruption;  Information transmitted may not be sufficient (e.g. poor resolution of images) to allow for appropriate medical decision making by the Practitioner; and/or   In rare instances, security protocols could fail, causing a breach of personal health information.  Furthermore, I acknowledge that it is my responsibility to provide information about my medical history, conditions and care that is complete and accurate to the best of my ability. I acknowledge that Practitioner's advice, recommendations, and/or decision may be based on factors not within their control, such as incomplete or inaccurate data provided by me or distortions of diagnostic images or specimens that may result from electronic transmissions. I  understand that the practice of medicine is not an exact science and that Practitioner makes no warranties or guarantees regarding treatment outcomes. I acknowledge that I will receive a copy of this consent concurrently upon execution via email to the email address I last provided but may also request a printed copy by calling the office of [facility].    I understand that my insurance will be billed for this visit.   I have read or had this consent read to me.  I understand the contents of this consent, which adequately explains the benefits and risks of the Services being provided via telemedicine.   I have been provided ample opportunity to ask questions regarding this consent and the Services and have had  my questions answered to my satisfaction.  I give my informed consent for the services to be provided through the use of telemedicine in my medical care  By participating in this telemedicine visit I agree to the above.

## 2018-07-31 ENCOUNTER — Encounter: Payer: Self-pay | Admitting: *Deleted

## 2018-07-31 ENCOUNTER — Telehealth (INDEPENDENT_AMBULATORY_CARE_PROVIDER_SITE_OTHER): Payer: Medicare Other | Admitting: Cardiology

## 2018-07-31 ENCOUNTER — Encounter: Payer: Self-pay | Admitting: Cardiology

## 2018-07-31 VITALS — Ht 61.0 in | Wt 163.0 lb

## 2018-07-31 DIAGNOSIS — R079 Chest pain, unspecified: Secondary | ICD-10-CM

## 2018-07-31 DIAGNOSIS — R600 Localized edema: Secondary | ICD-10-CM

## 2018-07-31 MED ORDER — TORSEMIDE 20 MG PO TABS: 40.0000 mg | ORAL_TABLET | Freq: Two times a day (BID) | ORAL | 0 refills | Status: DC

## 2018-07-31 NOTE — Patient Instructions (Signed)
Your physician recommends that you schedule a follow-up appointment in: [place] physician has recommended you make the following change in your medication:   STOP LASIX   START TORSEMIDE 40 MG (2 TABLETS) TWICE DAILY   Thank you for choosing [facility]!!

## 2018-07-31 NOTE — Progress Notes (Signed)
Virtual Visit via Telephone Note   This visit type was conducted due to national recommendations for restrictions regarding the COVID-19 Pandemic (e.g. social distancing) in an effort to limit this patient's exposure and mitigate transmission in our community.  Due to her co-morbid illnesses, this patient is at least at moderate risk for complications without adequate follow up.  This format is felt to be most appropriate for this patient at this time.  The patient did not have access to video technology/had technical difficulties with video requiring transitioning to audio format only (telephone).  All issues noted in this document were discussed and addressed.  No physical exam could be performed with this format.  Please refer to the patient's chart for her  consent to telehealth for [facility].   Evaluation Performed:  Follow-up visit  Date:  07/31/2018   ID:  [name], [name] Sep 22, 1960, MRN [id number]  Patient Location: Home Provider Location: Home  PCP:  Wyatt Haste, NP  Cardiologist:  Dr Carlyle Dolly MD Electrophysiologist:  None   Chief Complaint:  Chest pain leg edema  History of Present Illness:    [name] is a 58 y.o. female with seen today for follow up of the following medical problems.    1. Chest pain - started about 4 months ago - initial episode occurred whle sleeping. Heaviness on chest radiating down left arm with associated numbness, and pain radiating to left chest. 20/10 in severity. +SOB. + diaphoresis. Worst with movement. Pain lasted 30 minutes. - has had repeat episodes since that time - chronic arthritis. Can have pain with position - increased DOE, recent bilaeral LE edema  CAD risk factors: HTN, diet controlled DM2 previously on insulin, +tobacco x 25 years. Mother "heart troubles" in her mid 4s, multiple siblings with heart troubles at early age. - echo 10/2016 LVEF 60-65%.   - no showed for lexiscan 01/2017 - she thinks  she had a stress test [place] later on.   - sharp pains under left breast. With laying down on side pressure is worst. Both numbs get numb. Better with sitting up. Pain last 20 minutes or so. Can get nauseous, diaphoretic. Pain is similar but more intense.  Sedentary lifestyle.     2. LE edema - chronic bilateral LE edema that has gotten worst - 133 lbs to 163 lbs - pcp increased lasix over time, taking lasix 40mg  tid.     3. Chronic left shoulder pain - followed by pcp  4. Chronic neck/back pain - followed by pcp - prior surgeries    The patient does not have symptoms concerning for COVID-19 infection (fever, chills, cough, or new shortness of breath).    Past Medical History:  Diagnosis Date  . Back pain   . Depression   . GERD (gastroesophageal reflux disease)   . Hypertension    diet control  . Hypothyroid   . IBS (irritable bowel syndrome)   . Migraines   . Obesity   . Osteoarthritis   . Sleep apnea    Past Surgical History:  Procedure Laterality Date  . AMPUTATION  12/13/2011   Procedure: AMPUTATION DIGIT;  Surgeon: Marcheta Grammes, DPM;  Location: [facility];  Service: Orthopedics;  Laterality: Right;  amputation second toe right foot  . BACK SURGERY    . BIOPSY  12/23/2015   Procedure: BIOPSY;  Surgeon: Rogene [place], MD;  Location: [facility];  Service: Endoscopy;;  pre pyloric patches  . BREAST BIOPSY    .  CHOLECYSTECTOMY    . CYSTOSCOPY W/ URETERAL STENT PLACEMENT Left 10/09/2017   Procedure: CYSTOSCOPY WITH RETROGRADE PYELOGRAM/URETERAL STENT PLACEMENT;  Surgeon: Cleon Gustin, MD;  Location: [facility];  Service: Urology;  Laterality: Left;  . CYSTOSCOPY WITH RETROGRADE PYELOGRAM, URETEROSCOPY AND STENT PLACEMENT Left 10/23/2017   Procedure: CYSTOSCOPY WITH LEFT RETROGRADE PYELOGRAM, LEFT URETEROSCOPY AND STENT EXCHANGE;  Surgeon: Cleon Gustin, MD;  Location: [facility];  Service: Urology;  Laterality: Left;  . ESOPHAGOGASTRODUODENOSCOPY   05/28/2011   Procedure: ESOPHAGOGASTRODUODENOSCOPY (EGD);  Surgeon: Rogene [place], MD;  [facility];  Service: Endoscopy;  Laterality: N/A;  730  . ESOPHAGOGASTRODUODENOSCOPY N/A 10/09/2013   Procedure: ESOPHAGOGASTRODUODENOSCOPY (EGD);  Surgeon: Rogene Houston, MD;  [facility];  Service: Endoscopy;  Laterality: N/A;  730  . ESOPHAGOGASTRODUODENOSCOPY (EGD) WITH PROPOFOL N/A 12/23/2015   Procedure: ESOPHAGOGASTRODUODENOSCOPY (EGD) WITH PROPOFOL;  Surgeon: Rogene [place], MD;  [facility];  Service: Endoscopy;  Laterality: N/A;  . HERNIA REPAIR    . HOLMIUM LASER APPLICATION Left 6/50/3546   Procedure: LEFT URETEROSCOPY WITH HOLMIUM LASER LITHOTRIPSY;  Surgeon: Cleon Gustin, MD;  Location: [facility];  Service: Urology;  Laterality: Left;  Marland Kitchen MALONEY DILATION N/A 10/09/2013   Procedure: MALONEY DILATION;  Surgeon: Rogene Houston, MD;  [facility];  Service: Endoscopy;  Laterality: N/A;  . NASAL SEPTUM SURGERY    . NECK SURGERY     2 yrs ago  . TOTAL ABDOMINAL HYSTERECTOMY     Left oophroectomy for a lare tumor about 21 yrs. RT ovary removed time of hysterectomy     No outpatient medications have been marked as taking for the 07/31/18 encounter (Appointment) with [name], MD.     Allergies:   Cephalexin; Latex; Betadine [povidone iodine]; Cleocin [clindamycin hcl]; Sulfa antibiotics; and Toradol [ketorolac tromethamine]   Social History   Tobacco Use  . Smoking status: Current Every Day Smoker    Packs/day: 0.50    Years: 35.00    Pack years: 17.50  . Smokeless tobacco: Never Used  . Tobacco comment: 1 pack a day since age18  Substance Use Topics  . Alcohol use: No  . Drug use: No     Family Hx: The patient's family history is not on file.  ROS:   Please see the history of present illness.     All other systems reviewed and are negative.   Prior CV studies:   The following studies were reviewed today:  10/2016  echo Study Conclusions  - Left ventricle: The cavity size was normal. Wall thickness was normal. Systolic function was normal. The estimated ejection fraction was in the range of 60% to 65%. Wall motion was normal; there were no regional wall motion abnormalities. Left ventricular diastolic function parameters were normal. - Mitral valve: There was trivial regurgitation. - Right atrium: Central venous pressure (est): 8 mm Hg. - Atrial septum: No defect or patent foramen ovale was identified. - Tricuspid valve: There was trivial regurgitation. - Pulmonary arteries: PA peak pressure: 15 mm Hg (S). - Pericardium, extracardiac: There was no pericardial effusion.  Impressions:  - Normal LV wall thickness with LVEF 60-65% and normal diastolic function. Trivial mitral and tricuspid regurgitation. Normal estimated PASP 15 mmHg.   Labs/Other Tests and Data Reviewed:    EKG:  na  Recent Labs: 10/08/2017: BUN 13; Creatinine, Ser 0.53; Hemoglobin 12.8; Platelets 186; Potassium 3.3; Sodium 136   Recent Lipid Panel Lab Results  Component Value Date/Time   CHOL  10/16/2006 01:39 AM    145        ATP III CLASSIFICATION:  <200     mg/dL   Desirable  200-239  mg/dL   Borderline High  >=240    mg/dL   High   TRIG 408 (H) 10/16/2006 01:39 AM   HDL <10 (L) 10/16/2006 01:39 AM   CHOLHDL NOT CALCULATED 10/16/2006 01:39 AM   LDLCALC  10/16/2006 01:39 AM    UNABLE TO CALCULATE IF TRIGLYCERIDE OVER 400 mg/dL        Total Cholesterol/HDL:CHD Risk Coronary Heart Disease Risk Table                     Men   Women  1/2 Average Risk   3.4   3.3    Wt Readings from Last 3 Encounters:  03/06/18 137 lb (62.1 kg)  01/09/18 137 lb (62.1 kg)  12/10/17 137 lb (62.1 kg)     Objective:    Vital Signs:   Today's Vitals   07/31/18 1551  Weight: 163 lb (73.9 kg)  Height: 5\' 1"  (1.549 m)   Body mass index is 30.8 kg/m.  Normal affect. Normal speech pattern and tone. Comfortable,  no apparent distress. No audible signs of SOB or wheezing.   ASSESSMENT & PLAN:     1. Chest pain - chronic somewhat atypical chest pain - she was to have stress test in 2018 but no showed, she reports she later had one at [facility] Will request results - I think her symptoms are more related to her chronic neck and back pain, likely cervical radiculopathy.  2. LE edema - weight up about 30 lbs, she reports significant leg edema - has not improved with increased lasix dosing by pcp - stop lasix, start torsemide 40mg  bid - request labs from pcp. See if more recent echo at [facility]. Likely repeat echo once COVID-19 risks have decreased   COVID-19 Education: The signs and symptoms of COVID-19 were discussed with the patient and how to seek care for testing (follow up with PCP or arrange E-visit).  The importance of social distancing was discussed today.  Time:   Today, I have spent 20  minutes with the patient with telehealth technology discussing the above problems.     Medication Adjustments/Labs and Tests Ordered: Current medicines are reviewed at length with the patient today.  Concerns regarding medicines are outlined above.   Tests Ordered: No orders of the defined types were placed in this encounter.   Medication Changes: No orders of the defined types were placed in this encounter.   Disposition:  Follow up 2 weeks  Signed, Carlyle Dolly, MD  07/31/2018 3:14 PM    [place]

## 2018-08-05 ENCOUNTER — Ambulatory Visit (INDEPENDENT_AMBULATORY_CARE_PROVIDER_SITE_OTHER): Payer: Medicare Other | Admitting: Orthopaedic Surgery

## 2018-08-05 ENCOUNTER — Encounter: Payer: Self-pay | Admitting: Orthopaedic Surgery

## 2018-08-05 ENCOUNTER — Other Ambulatory Visit: Payer: Self-pay

## 2018-08-05 DIAGNOSIS — M25512 Pain in left shoulder: Secondary | ICD-10-CM | POA: Diagnosis not present

## 2018-08-05 DIAGNOSIS — G8929 Other chronic pain: Secondary | ICD-10-CM | POA: Diagnosis not present

## 2018-08-05 DIAGNOSIS — F17210 Nicotine dependence, cigarettes, uncomplicated: Secondary | ICD-10-CM | POA: Diagnosis not present

## 2018-08-05 DIAGNOSIS — G894 Chronic pain syndrome: Secondary | ICD-10-CM | POA: Diagnosis not present

## 2018-08-05 NOTE — Progress Notes (Signed)
PROCEDURE NOTE:  The patient request injection, verbal consent was obtained.  The left shoulder was prepped appropriately after time out was performed.   Sterile technique was observed and injection of 1 cc of Depo-Medrol 40 mg with several cc's of plain xylocaine. Anesthesia was provided by ethyl chloride and a 20-gauge needle was used to inject the shoulder area. A posterior approach was used.  The injection was tolerated well.  A band aid dressing was applied.  The patient was advised to apply ice later today and tomorrow to the injection sight as needed.  Return in one month.  Call if any problem.  Precautions discussed.   Electronically Signed Sanjuana Kava, MD 5/5/202010:04 AM

## 2018-08-15 ENCOUNTER — Telehealth (INDEPENDENT_AMBULATORY_CARE_PROVIDER_SITE_OTHER): Payer: Medicare Other | Admitting: Cardiology

## 2018-08-15 ENCOUNTER — Encounter: Payer: Self-pay | Admitting: Cardiology

## 2018-08-15 VITALS — Ht 60.0 in | Wt 163.0 lb

## 2018-08-15 DIAGNOSIS — R079 Chest pain, unspecified: Secondary | ICD-10-CM

## 2018-08-15 DIAGNOSIS — R600 Localized edema: Secondary | ICD-10-CM

## 2018-08-15 DIAGNOSIS — R0602 Shortness of breath: Secondary | ICD-10-CM

## 2018-08-15 NOTE — Progress Notes (Signed)
Virtual Visit via Telephone Note   This visit type was conducted due to national recommendations for restrictions regarding the COVID-19 Pandemic (e.g. social distancing) in an effort to limit this patient's exposure and mitigate transmission in our community.  Due to her co-morbid illnesses, this patient is at least at moderate risk for complications without adequate follow up.  This format is felt to be most appropriate for this patient at this time.  The patient did not have access to video technology/had technical difficulties with video requiring transitioning to audio format only (telephone).  All issues noted in this document were discussed and addressed.  No physical exam could be performed with this format.  Please refer to the patient's chart for her  consent to telehealth for [facility].   Date:  08/15/2018   ID:  [name], DOB 12/08/1960, MRN [id number]  Patient Location: Home Provider Location: Home  PCP:  Wyatt Haste, NP  Cardiologist:  Dr Carlyle Dolly MD  Electrophysiologist:  None   Evaluation Performed:  Follow-Up Visit  Chief Complaint:  3 week follow up   History of Present Illness:    [name] is a 58 y.o. female with seen today for follow up of the following medical problems.    1. Chest pain - started about 4 months ago - initial episode occurred whle sleeping. Heaviness on chest radiating down left arm with associated numbness, and pain radiating to left chest. 20/10 in severity. +SOB. + diaphoresis. Worst with movement. Pain lasted 30 minutes. - has had repeat episodes since that time - chronic arthritis. Can have pain with position - increased DOE, recent bilaeral LE edema  CAD risk factors: HTN, diet controlled DM2 previously on insulin, +tobacco x 25 years. Mother "heart troubles" in her mid 53s, multiple siblings with heart troubles at early age. - echo 10/2016 LVEF 60-65%.  - no showed for lexiscan 01/2017 - she thinks  she had a stress test [place] later on.   - sharp pains under left breast. With laying down on side pressure is worst. Both arms get numb. Better with sitting up. Pain last 20 minutes or so. Can get nauseous, diaphoretic. Pain is similar but more intense to previous symptoms.  Sedentary lifestyle.   - recurrent episode of chest pain, SOB since last visit.   2. LE edema - chronic bilateral LE edema that has gotten worst - 133 lbs to 163 lbs - pcp increased lasix over time, taking lasix 40mg  tid.    - last visit we changed her to torsemide 40mg  bid. Weight was 163 lbs then,  - swelling is improving some, weight slowly trending down. 158-163 lbs. Shoes are fitting better.   3. Chronic left shoulder pain - followed by pcp  4. Chronic neck/back pain - followed by pcp - prior surgeries         The patient does not have symptoms concerning for COVID-19 infection (fever, chills, cough, or new shortness of breath).    Past Medical History:  Diagnosis Date   Back pain    Depression    GERD (gastroesophageal reflux disease)    Hypertension    diet control   Hypothyroid    IBS (irritable bowel syndrome)    Migraines    Obesity    Osteoarthritis    Sleep apnea    Past Surgical History:  Procedure Laterality Date   AMPUTATION  12/13/2011   Procedure: AMPUTATION DIGIT;  Surgeon: Marcheta Grammes, DPM;  Location:  [facility];  Service: Orthopedics;  Laterality: Right;  amputation second toe right foot   BACK SURGERY     BIOPSY  12/23/2015   Procedure: BIOPSY;  Surgeon: Rogene Houston, MD;  Location: [facility];  Service: Endoscopy;;  pre pyloric patches   BREAST BIOPSY     CHOLECYSTECTOMY     CYSTOSCOPY W/ URETERAL STENT PLACEMENT Left 10/09/2017   Procedure: CYSTOSCOPY WITH RETROGRADE PYELOGRAM/URETERAL STENT PLACEMENT;  Surgeon: Cleon Gustin, MD;  Location: [facility];  Service: Urology;  Laterality: Left;   CYSTOSCOPY WITH RETROGRADE  PYELOGRAM, URETEROSCOPY AND STENT PLACEMENT Left 10/23/2017   Procedure: CYSTOSCOPY WITH LEFT RETROGRADE PYELOGRAM, LEFT URETEROSCOPY AND STENT EXCHANGE;  Surgeon: Cleon Gustin, MD;  Location: [facility];  Service: Urology;  Laterality: Left;   ESOPHAGOGASTRODUODENOSCOPY  05/28/2011   Procedure: ESOPHAGOGASTRODUODENOSCOPY (EGD);  Surgeon: Rogene Houston, MD;  [facility];  Service: Endoscopy;  Laterality: N/A;  730   ESOPHAGOGASTRODUODENOSCOPY N/A 10/09/2013   Procedure: ESOPHAGOGASTRODUODENOSCOPY (EGD);  Surgeon: Rogene Houston, MD;  [facility];  Service: Endoscopy;  Laterality: N/A;  730   ESOPHAGOGASTRODUODENOSCOPY (EGD) WITH PROPOFOL N/A 12/23/2015   Procedure: ESOPHAGOGASTRODUODENOSCOPY (EGD) WITH PROPOFOL;  Surgeon: Rogene Houston, MD;  [facility];  Service: Endoscopy;  Laterality: N/A;   HERNIA REPAIR     HOLMIUM LASER APPLICATION Left 5/64/3329   Procedure: LEFT URETEROSCOPY WITH HOLMIUM LASER LITHOTRIPSY;  Surgeon: Cleon Gustin, MD;  Location: [facility];  Service: Urology;  Laterality: Left;   MALONEY DILATION N/A 10/09/2013   Procedure: Venia Minks DILATION;  Surgeon: Rogene Houston, MD;  [facility];  Service: Endoscopy;  Laterality: N/A;   NASAL SEPTUM SURGERY     NECK SURGERY     2 yrs ago   TOTAL ABDOMINAL HYSTERECTOMY     Left oophroectomy for a lare tumor about 21 yrs. RT ovary removed time of hysterectomy     No outpatient medications have been marked as taking for the 08/15/18 encounter (Appointment) with [name], MD.     Allergies:   Cephalexin; Latex; Betadine [povidone iodine]; Cleocin [clindamycin hcl]; Sulfa antibiotics; and Toradol [ketorolac tromethamine]   Social History   Tobacco Use   Smoking status: Current Every Day Smoker    Packs/day: 0.50    Years: 35.00    Pack years: 17.50   Smokeless tobacco: Never Used   Tobacco comment: 1 pack a day since age18  Substance Use Topics    Alcohol use: No   Drug use: No     Family Hx: The patient's family history is not on file.  ROS:   Please see the history of present illness.     All other systems reviewed and are negative.   Prior CV studies:   The following studies were reviewed today:  10/2016 echo Study Conclusions  - Left ventricle: The cavity size was normal. Wall thickness was normal. Systolic function was normal. The estimated ejection fraction was in the range of 60% to 65%. Wall motion was normal; there were no regional wall motion abnormalities. Left ventricular diastolic function parameters were normal. - Mitral valve: There was trivial regurgitation. - Right atrium: Central venous pressure (est): 8 mm Hg. - Atrial septum: No defect or patent foramen ovale was identified. - Tricuspid valve: There was trivial regurgitation. - Pulmonary arteries: PA peak pressure: 15 mm Hg (S). - Pericardium, extracardiac: There was no pericardial effusion.  Impressions:  - Normal LV wall thickness  with LVEF 60-65% and normal diastolic function. Trivial mitral and tricuspid regurgitation. Normal estimated PASP 15 mmHg.  Labs/Other Tests and Data Reviewed:    EKG:  No ECG reviewed.  Recent Labs: 10/08/2017: BUN 13; Creatinine, Ser 0.53; Hemoglobin 12.8; Platelets 186; Potassium 3.3; Sodium 136   Recent Lipid Panel Lab Results  Component Value Date/Time   CHOL  10/16/2006 01:39 AM    145        ATP III CLASSIFICATION:  <200     mg/dL   Desirable  200-239  mg/dL   Borderline High  >=240    mg/dL   High   TRIG 408 (H) 10/16/2006 01:39 AM   HDL <10 (L) 10/16/2006 01:39 AM   CHOLHDL NOT CALCULATED 10/16/2006 01:39 AM   LDLCALC  10/16/2006 01:39 AM    UNABLE TO CALCULATE IF TRIGLYCERIDE OVER 400 mg/dL        Total Cholesterol/HDL:CHD Risk Coronary Heart Disease Risk Table                     Men   Women  1/2 Average Risk   3.4   3.3    Wt Readings from Last 3 Encounters:  07/31/18 163  lb (73.9 kg)  03/06/18 137 lb (62.1 kg)  01/09/18 137 lb (62.1 kg)     Objective:    Vital Signs:   Today's Vitals   08/15/18 0819  Weight: 163 lb (73.9 kg)  Height: 5' (1.524 m)   Body mass index is 31.83 kg/m.  Normal affect. Normal speech pattern and tone. Comfortable, no apparent distress. No audible signs of SOB or wheezing.     ASSESSMENT & PLAN:    1. Chest pain - chronic somewhat atypical chest pain - she was to have stress test in 2018 but no showed, she reports she later had one at [facility] however we have not been able to find results - difficult to discern her symptoms in setting of her chronic MSK pain. - obtain echo in setting of fluid and weight gain, pending results likely obtain lexiscan.   2. LE edema - weight up about 30 lbs, she reports significant leg edema - some improvement with torsemide but not resolved - repeat BMET/Mg, pending results would further titrate torsemide - obtain echo in setting of progressing CHF   COVID-19 Education: The signs and symptoms of COVID-19 were discussed with the patient and how to seek care for testing (follow up with PCP or arrange E-visit).  The importance of social distancing was discussed today.  Time:   Today, I have spent 14 minutes with the patient with telehealth technology discussing the above problems.     Medication Adjustments/Labs and Tests Ordered: Current medicines are reviewed at length with the patient today.  Concerns regarding medicines are outlined above.   Tests Ordered: No orders of the defined types were placed in this encounter.   Medication Changes: No orders of the defined types were placed in this encounter.   Disposition:  Follow up 3 weeks. Likely titrate torsemide based on upcoming labs, possible stress test after echo  Signed, Carlyle Dolly, MD  08/15/2018 7:56 AM    [place]

## 2018-08-15 NOTE — Progress Notes (Signed)
Medication Instructions:  Your physician recommends that you continue on your current medications as directed. Please refer to the Current Medication list given to you today.   Labwork: bmet magnesium  Testing/Procedures: Your physician has requested that you have an echocardiogram. Echocardiography is a painless test that uses sound waves to create images of your heart. It provides your doctor with information about the size and shape of your heart and how well your heart's chambers and valves are working. This procedure takes approximately one hour. There are no restrictions for this procedure.    Follow-Up: Your physician recommends that you schedule a follow-up appointment in: 3 weeks    Any Other Special Instructions Will Be Listed Below (If Applicable).     If you need a refill on your cardiac medications before your next appointment, please call your pharmacy.

## 2018-08-15 NOTE — Addendum Note (Signed)
Addended by: Debbora Lacrosse R on: 08/15/2018 09:13 AM   Modules accepted: Orders

## 2018-08-28 ENCOUNTER — Telehealth: Payer: Self-pay | Admitting: Orthopaedic Surgery

## 2018-08-28 MED ORDER — OXYCODONE HCL 15 MG PO TABS: ORAL_TABLET | ORAL | 0 refills | Status: DC

## 2018-08-28 NOTE — Telephone Encounter (Signed)
Oxycodone 15 MG immediate release  Qty  140 Tablets  PATIENT USES [facility]

## 2018-09-02 ENCOUNTER — Ambulatory Visit (HOSPITAL_COMMUNITY)
Admission: RE | Admit: 2018-09-02 | Discharge: 2018-09-02 | Disposition: A | Discharge status: Home / Self Care | Payer: Medicare Other | Source: Ambulatory Visit | Attending: Cardiology | Admitting: Cardiology

## 2018-09-02 ENCOUNTER — Other Ambulatory Visit: Payer: Self-pay

## 2018-09-02 DIAGNOSIS — R0602 Shortness of breath: Secondary | ICD-10-CM | POA: Diagnosis present

## 2018-09-02 NOTE — Progress Notes (Signed)
*  PRELIMINARY RESULTS* Echocardiogram 2D Echocardiogram has been performed.  Samuel Germany 09/02/2018, 11:42 AM

## 2018-09-04 ENCOUNTER — Telehealth: Payer: Self-pay

## 2018-09-04 DIAGNOSIS — R079 Chest pain, unspecified: Secondary | ICD-10-CM

## 2018-09-04 NOTE — Telephone Encounter (Signed)
-----   Message from [name], MD sent at 09/04/2018  9:27 AM EDT ----- Echo shows normal heart function. Can we get a lexiscan for chest pain, does not need to hold meds   Zandra Abts MD

## 2018-09-04 NOTE — Telephone Encounter (Signed)
Pt made aware of results. Placed order for [place]. Will forward to front office staff to schedule. Pt stated she will need oxygen during test as she cannot breathe while wearing mask, and she does not have a portable oxygen tank to bring with her.

## 2018-09-10 ENCOUNTER — Telehealth: Payer: Self-pay | Admitting: Orthopaedic Surgery

## 2018-09-10 ENCOUNTER — Telehealth: Payer: Self-pay | Admitting: *Deleted

## 2018-09-10 ENCOUNTER — Encounter: Payer: Self-pay | Admitting: Orthopaedic Surgery

## 2018-09-10 ENCOUNTER — Ambulatory Visit (INDEPENDENT_AMBULATORY_CARE_PROVIDER_SITE_OTHER): Payer: Medicare Other | Admitting: Orthopaedic Surgery

## 2018-09-10 ENCOUNTER — Other Ambulatory Visit: Payer: Self-pay

## 2018-09-10 VITALS — Ht 60.0 in | Wt 163.0 lb

## 2018-09-10 DIAGNOSIS — M25512 Pain in left shoulder: Secondary | ICD-10-CM

## 2018-09-10 DIAGNOSIS — F17210 Nicotine dependence, cigarettes, uncomplicated: Secondary | ICD-10-CM

## 2018-09-10 DIAGNOSIS — G8929 Other chronic pain: Secondary | ICD-10-CM

## 2018-09-10 DIAGNOSIS — G894 Chronic pain syndrome: Secondary | ICD-10-CM

## 2018-09-10 MED ORDER — TIZANIDINE HCL 4 MG PO TABS: ORAL_TABLET | ORAL | 3 refills | Status: DC

## 2018-09-10 NOTE — Telephone Encounter (Signed)
Pt requesting to have labs done on Friday (same day as she is having stress test) for "some type of poison" advised pt that blood test of that nature would need to come from pcp as we only follow heart and related - pt voiced understanding and will contact pcp

## 2018-09-10 NOTE — Progress Notes (Signed)
PROCEDURE NOTE:  The patient request injection, verbal consent was obtained.  The left shoulder was prepped appropriately after time out was performed.   Sterile technique was observed and injection of 1 cc of Depo-Medrol 40 mg with several cc's of plain xylocaine. Anesthesia was provided by ethyl chloride and a 20-gauge needle was used to inject the shoulder area. A posterior approach was used.  The injection was tolerated well.  A band aid dressing was applied.  The patient was advised to apply ice later today and tomorrow to the injection sight as needed.  I will see her in one month.  Call if any problem.  Precautions discussed.   Electronically Signed Sanjuana Kava, MD 6/10/202011:20 AM

## 2018-09-10 NOTE — Telephone Encounter (Signed)
[name] called and stated she forgot to ask for  Refill on Zanaflex 4 mgs. Qty 2 with refills  Sig: One by mouth every eight hours as needed for spasm  She uses [name] in [place]

## 2018-09-11 ENCOUNTER — Telehealth: Payer: Self-pay | Admitting: Orthopaedic Surgery

## 2018-09-11 NOTE — Telephone Encounter (Signed)
Patient stated regarding conversation with you yesterday, she spoke with her doctor and he won't do a blood test to see how much poison is in her body. She is asking if you could do that for her if not what should she do.  Please advise

## 2018-09-11 NOTE — Telephone Encounter (Signed)
Run blood screen for arsenic, lead and other toxins.  Send me report.

## 2018-09-12 ENCOUNTER — Encounter (HOSPITAL_COMMUNITY): Payer: Self-pay

## 2018-09-12 ENCOUNTER — Emergency Department (HOSPITAL_COMMUNITY): Payer: Medicare Other

## 2018-09-12 ENCOUNTER — Other Ambulatory Visit: Payer: Self-pay

## 2018-09-12 ENCOUNTER — Emergency Department (HOSPITAL_COMMUNITY)
Admission: EM | Admit: 2018-09-12 | Discharge: 2018-09-12 | Disposition: A | Discharge status: Home / Self Care | Payer: Medicare Other | Attending: Emergency Medicine | Admitting: Emergency Medicine

## 2018-09-12 ENCOUNTER — Ambulatory Visit (HOSPITAL_COMMUNITY)
Admission: RE | Admit: 2018-09-12 | Discharge: 2018-09-12 | Disposition: A | Discharge status: Home / Self Care | Payer: Medicare Other | Source: Ambulatory Visit | Attending: Cardiology | Admitting: Cardiology

## 2018-09-12 ENCOUNTER — Encounter (HOSPITAL_COMMUNITY)
Admission: RE | Admit: 2018-09-12 | Discharge: 2018-09-12 | Disposition: A | Discharge status: Home / Self Care | Payer: Medicare Other | Source: Ambulatory Visit | Attending: Cardiology | Admitting: Cardiology

## 2018-09-12 DIAGNOSIS — I10 Essential (primary) hypertension: Secondary | ICD-10-CM | POA: Insufficient documentation

## 2018-09-12 DIAGNOSIS — M25512 Pain in left shoulder: Secondary | ICD-10-CM

## 2018-09-12 DIAGNOSIS — Z91040 Latex allergy status: Secondary | ICD-10-CM | POA: Insufficient documentation

## 2018-09-12 DIAGNOSIS — E039 Hypothyroidism, unspecified: Secondary | ICD-10-CM | POA: Diagnosis not present

## 2018-09-12 DIAGNOSIS — Z79899 Other long term (current) drug therapy: Secondary | ICD-10-CM | POA: Insufficient documentation

## 2018-09-12 DIAGNOSIS — R079 Chest pain, unspecified: Secondary | ICD-10-CM

## 2018-09-12 DIAGNOSIS — R55 Syncope and collapse: Secondary | ICD-10-CM

## 2018-09-12 DIAGNOSIS — F17210 Nicotine dependence, cigarettes, uncomplicated: Secondary | ICD-10-CM | POA: Diagnosis not present

## 2018-09-12 LAB — CBC WITH DIFFERENTIAL/PLATELET
Abs Immature Granulocytes: 0 10*3/uL (ref 0.00–0.07)
Basophils Absolute: 0 10*3/uL (ref 0.0–0.1)
Basophils Relative: 1 %
Eosinophils Absolute: 0.2 10*3/uL (ref 0.0–0.5)
Eosinophils Relative: 6 %
HCT: 41.5 % (ref 36.0–46.0)
Hemoglobin: 13 g/dL (ref 12.0–15.0)
Immature Granulocytes: 0 %
Lymphocytes Relative: 41 %
Lymphs Abs: 1.6 10*3/uL (ref 0.7–4.0)
MCH: 29.1 pg (ref 26.0–34.0)
MCHC: 31.3 g/dL (ref 30.0–36.0)
MCV: 92.8 fL (ref 80.0–100.0)
Monocytes Absolute: 0.2 10*3/uL (ref 0.1–1.0)
Monocytes Relative: 6 %
Neutro Abs: 1.9 10*3/uL (ref 1.7–7.7)
Neutrophils Relative %: 46 %
Platelets: 210 10*3/uL (ref 150–400)
RBC: 4.47 MIL/uL (ref 3.87–5.11)
RDW: 14.3 % (ref 11.5–15.5)
WBC: 4 10*3/uL (ref 4.0–10.5)
nRBC: 0 % (ref 0.0–0.2)

## 2018-09-12 LAB — URINALYSIS, ROUTINE W REFLEX MICROSCOPIC
Bilirubin Urine: NEGATIVE
Glucose, UA: NEGATIVE mg/dL
Hgb urine dipstick: NEGATIVE
Ketones, ur: NEGATIVE mg/dL
Leukocytes,Ua: NEGATIVE
Nitrite: NEGATIVE
Protein, ur: NEGATIVE mg/dL
Specific Gravity, Urine: 1.002 — ABNORMAL LOW (ref 1.005–1.030)
pH: 8 (ref 5.0–8.0)

## 2018-09-12 LAB — BASIC METABOLIC PANEL
Anion gap: 9 (ref 5–15)
BUN: 8 mg/dL (ref 6–20)
CO2: 22 mmol/L (ref 22–32)
Calcium: 9.1 mg/dL (ref 8.9–10.3)
Chloride: 110 mmol/L (ref 98–111)
Creatinine, Ser: 0.66 mg/dL (ref 0.44–1.00)
GFR calc Af Amer: >60 mL/min (ref >60)
GFR calc non Af Amer: >60 mL/min (ref >60)
Glucose, Bld: 87 mg/dL (ref 70–99)
Potassium: 3.3 mmol/L — ABNORMAL LOW (ref 3.5–5.1)
Sodium: 141 mmol/L (ref 135–145)

## 2018-09-12 LAB — TROPONIN I: Troponin I: <0.03 ng/mL (ref <0.03)

## 2018-09-12 MED ORDER — ASPIRIN 81 MG PO CHEW
324.0000 mg | CHEWABLE_TABLET | Freq: Once | ORAL | Status: DC
  Filled 2018-09-12: qty 4

## 2018-09-12 MED ORDER — ACETAMINOPHEN 325 MG PO TABS
650.0000 mg | ORAL_TABLET | Freq: Once | ORAL | Status: DC
  Filled 2018-09-12: qty 2

## 2018-09-12 MED ORDER — OXYCODONE HCL 5 MG PO TABS
15.0000 mg | ORAL_TABLET | Freq: Once | ORAL | Status: DC
  Filled 2018-09-12: qty 3

## 2018-09-12 NOTE — ED Notes (Addendum)
Nurse went into room to give meds Pt laying in bed would not respond to nurse. Extremity resistive to gravity. Pt Vital signs WNL. Ulitized ammonia and pt responded. Notified PA. Previously notifed by staff that patient wanted her pocket book to take OXY IR . Unable to get into room immedidately. PA told pt that all her testing was normal pt rips off all of leads and says she is leaving. Says she was unresponsive for an hour and no one did that. Pt assisted to BR and will be assisted to car. Pt fussing as she was going down hall if nothing was wrong w her than why didn't  they do stress test

## 2018-09-12 NOTE — ED Notes (Signed)
Pt ambulated halfway to the bathroom with assistance via cane by this NT, halfway between the room and bathroom pt was assisted to a wheelchair and used the restroom. Urine specimen was collected

## 2018-09-12 NOTE — ED Provider Notes (Signed)
[facility] EMERGENCY DEPARTMENT Provider Note   CSN: [id number] Arrival date & time: 09/12/18  0901    History   Chief Complaint Chief Complaint  Patient presents with   Near Syncope    HPI [name] is a 58 y.o. female with history as outlined below, most significant for hypertension, hypothyroidism, migraine headaches, chronic pain secondary to osteoarthritis, chronic chest pain of many years duration but escalating recently presenting with a syncopal event while here for a myocardial perfusion stress test.  She was given a facemask given the current COVID restrictions, and she had complaints of inability to breathe and requested oxygen which was not given her, she became upset and had a syncopal event.  She was sitting at the time, did not fall, but was placed on a stretcher by medical personnel, and states she has left shoulder pain since the event which is new.  She also endorses left sharp chest pain which is similar to her chronic chest pain.  She denies current shortness of breath.  She does have oxygen at home for as needed use but typically does not need.  She denies weakness or numbness in her left arm and there is no radiation of pain into her arm.  She has had no treatment prior to arrival.     The history is provided by the patient.    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   Intractable migraine without aura and without status migrainosus 12/17/2016   Chronic right shoulder pain 09/04/2016   Cigarette nicotine dependence without complication 36/64/4034    Morbid obesity due to excess calories ([place]) 09/04/2016   RUQ abdominal pain 12/13/2015   Hematemesis with nausea 12/13/2015   Nausea with vomiting 12/13/2015   Kidney stone 11/17/2015   Right shoulder pain 05/26/2015   Dysphagia, unspecified(787.20) 10/01/2013   Unspecified hypothyroidism 10/01/2013   Unspecified sleep apnea 10/01/2013   Migraines 10/01/2013   GERD (gastroesophageal reflux disease) 10/01/2013   History of IBS 10/01/2013    Past Surgical History:  Procedure Laterality Date   AMPUTATION  12/13/2011   Procedure: AMPUTATION DIGIT;  Surgeon: Marcheta Grammes, DPM;  Location: [facility] OR[facility];  Service: Orthopedics;  Laterality: Right;  amputation second toe right foot   BACK SURGERY     BIOPSY  12/23/2015   Procedure: BIOPSY;  Surgeon: Rogene Houston, MD;  Location: [facility];  Service: Endoscopy;;  pre pyloric patches   BREAST BIOPSY     CHOLECYSTECTOMY     CYSTOSCOPY W/ URETERAL STENT PLACEMENT Left 10/09/2017   Procedure: CYSTOSCOPY WITH RETROGRADE PYELOGRAM/URETERAL STENT PLACEMENT;  Surgeon: Cleon Gustin, MD;  Location: [facility];  Service: Urology;  Laterality: Left;   CYSTOSCOPY WITH RETROGRADE PYELOGRAM, URETEROSCOPY AND STENT PLACEMENT Left 10/23/2017   Procedure: CYSTOSCOPY WITH LEFT RETROGRADE PYELOGRAM, LEFT URETEROSCOPY AND STENT EXCHANGE;  Surgeon: Cleon Gustin, MD;  Location: [facility];  Service: Urology;  Laterality: Left;   ESOPHAGOGASTRODUODENOSCOPY  05/28/2011   Procedure: ESOPHAGOGASTRODUODENOSCOPY (EGD);  Surgeon: Mechele Dawley  Laural Golden, MD;  Location: [facility];  Service: Endoscopy;  Laterality: N/A;  730   ESOPHAGOGASTRODUODENOSCOPY N/A 10/09/2013   Procedure: ESOPHAGOGASTRODUODENOSCOPY (EGD);  Surgeon: Rogene Houston, MD;  Location: [facility];  Service: Endoscopy;  Laterality: N/A;  730   ESOPHAGOGASTRODUODENOSCOPY (EGD) WITH PROPOFOL N/A 12/23/2015   Procedure: ESOPHAGOGASTRODUODENOSCOPY (EGD) WITH PROPOFOL;  Surgeon:  Rogene Houston, MD;  Location: [facility];  Service: Endoscopy;  Laterality: N/A;   HERNIA REPAIR     HOLMIUM LASER APPLICATION Left 1/61/0960   Procedure: LEFT URETEROSCOPY WITH HOLMIUM LASER LITHOTRIPSY;  Surgeon: Cleon Gustin, MD;  Location: [facility];  Service: Urology;  Laterality: Left;   MALONEY DILATION N/A 10/09/2013   Procedure: Venia Minks DILATION;  Surgeon: Rogene Houston, MD;  Location: [facility];  Service: Endoscopy;  Laterality: N/A;   NASAL SEPTUM SURGERY     NECK SURGERY     2 yrs ago   TOTAL ABDOMINAL HYSTERECTOMY     Left oophroectomy for a lare tumor about 21 yrs. RT ovary removed time of hysterectomy     OB History   No obstetric history on file.      Home Medications    Prior to Admission medications   Medication Sig Start Date End Date Taking? Authorizing Provider  beta carotene w/minerals (OCUVITE) tablet Take 1 tablet by mouth daily.      [provider]  diclofenac sodium (VOLTAREN) 1 % GEL apply FOUR grams TO affected joints AS NEEDED FOUR TIMES DAILY 06/30/18   Sanjuana Kava, MD  Docusate Sodium 100 MG capsule Take 100 mg by mouth 2 (two) times daily.    [provider]  esomeprazole (NEXIUM) 40 MG capsule Take 1 capsule (40 mg total) by mouth daily before breakfast. 12/13/15   Rehman, Mechele Dawley, MD  hydrOXYzine (VISTARIL) 25 MG capsule Take 25 mg by mouth every 6 (six) hours as needed for anxiety.  09/04/17   [provider]  levothyroxine (SYNTHROID, LEVOTHROID) 112 MCG tablet Take 112 mcg by mouth daily before breakfast.     [provider]  LINZESS 290 MCG CAPS capsule Take 290 mcg by mouth daily. 10/04/17   [provider]  metoprolol tartrate (LOPRESSOR) 25 MG tablet Take 25 mg by mouth 2 (two) times daily. 02/03/18   [provider]  Multiple Vitamins-Iron (MULTIVITAMINS WITH IRON) TABS Take 1 tablet by mouth daily.    [provider]  MYRBETRIQ 25 MG TB24 tablet Take 25 mg by  mouth daily. 12/19/17   [provider]  naloxone (NARCAN) nasal spray 4 mg/0.1 mL CALL 911. ADMINISTER A SINGLE SPRAY OF NARCAN IN ONE NOSTRIL, REPEAT EVERY 3 MINUTES AS NEEDED IF NO OR MINIMAL RESPONSE 03/08/17   [provider]  ondansetron (ZOFRAN ODT) 4 MG disintegrating tablet Take 1 tablet (4 mg total) by mouth every 8 (eight) hours as needed for nausea or vomiting. 10/09/17   McKenzie, Candee Furbish, MD  oxyCODONE (ROXICODONE) 15 MG immediate release tablet TAKE 1 TABLET BY MOUTH EVERY FIVE HOURS AS NEEDED FOR PAIN 08/28/18   Sanjuana Kava, MD  polyethylene glycol (MIRALAX / GLYCOLAX) packet Take 17 g by mouth 2 (two) times daily before a meal.      [provider]  potassium chloride (MICRO-K) 10 MEQ CR capsule Take 10 mEq by mouth 2 (two) times daily. 10/04/17   [provider]  pregabalin (LYRICA) 100 MG capsule Take 100 mg by mouth 2 (two) times daily. *May take  three times daily    [provider]  silver sulfADIAZINE (SSD) 1 % cream APPLY TO THE AFFECTED AREA(S) TWICE DAILY UNTIL HEALED 09/16/17   [provider]  sucralfate (CARAFATE) 1 g tablet TAKE 1 TABLET BY MOUTH FOUR TIMES DAILY WITH MEALS AND AT BEDTIME 08/24/16   Rehman, Mechele Dawley, MD  SUMAtriptan (IMITREX) 100 MG tablet TAKE 1 TABLET BY MOUTH AT ONSET OF HEADACHE. MAY REPEAT ONCE IN TWO HOURS. NO MORE THAN TWO TABLETS IN 24 HOURS 09/11/17   [provider]  tiZANidine (ZANAFLEX) 4 MG tablet One by mouth every eight hours as needed for spasm. 09/10/18   Sanjuana Kava, MD  topiramate (TOPAMAX) 100 MG tablet Take 100 mg by mouth 2 (two) times daily.      [provider]  torsemide (DEMADEX) 20 MG tablet Take 2 tablets (40 mg total) by mouth 2 (two) times daily. 07/31/18 10/29/18  [name], MD  traZODone (DESYREL) 50 MG tablet Take 150 mg by mouth at bedtime. 10/05/17   [provider]  VENTOLIN HFA 108 (90 Base) MCG/ACT inhaler Inhale 1-2 puffs into the lungs  every 6 (six) hours as needed for wheezing or shortness of breath.  06/01/16   [provider]    Family History No family history on file.  Social History Social History   Tobacco Use   Smoking status: Current Every Day Smoker    Packs/day: 0.50    Years: 35.00    Pack years: 17.50   Smokeless tobacco: Never Used   Tobacco comment: 1 pack a day since age18  Substance Use Topics   Alcohol use: No   Drug use: No     Allergies   Cephalexin, Latex, Betadine [povidone iodine], Cleocin [clindamycin hcl], Sulfa antibiotics, and Toradol [ketorolac tromethamine]   Review of Systems Review of Systems  Constitutional: Negative for fever.  HENT: Negative for congestion and sore throat.   Eyes: Negative.   Respiratory: Negative for cough, chest tightness, shortness of breath and wheezing.   Cardiovascular: Positive for chest pain and leg swelling.       Chronic peripheral edema  Gastrointestinal: Negative for abdominal pain, nausea and vomiting.  Genitourinary: Negative.   Musculoskeletal: Positive for arthralgias. Negative for joint swelling and neck pain.       Chronic "allover" arthralgias from arthritis, left shoulder new.  Skin: Positive for color change. Negative for rash and wound.       Chronic erythema and edema bilateral lower legs.  Neurological: Positive for syncope. Negative for dizziness, weakness, light-headedness, numbness and headaches.  Psychiatric/Behavioral: Negative.      Physical Exam Updated Vital Signs BP 128/88 (BP Location: Right Arm)    Pulse 69    Temp 98.4 F (36.9 C) (Oral)    Resp 20    Ht 5\' 1"  (1.549 m)    Wt 72.6 kg    SpO2 99%    BMI 30.23 kg/m   Physical Exam Vitals signs and nursing note reviewed.  Constitutional:      Appearance: She is well-developed.  HENT:     Head: Normocephalic and atraumatic.  Eyes:     Conjunctiva/sclera: Conjunctivae normal.  Neck:     Musculoskeletal: Normal range of motion.  Cardiovascular:      Rate and Rhythm: Normal rate and regular rhythm.     Heart sounds: Normal heart sounds.  Pulmonary:     Effort: Pulmonary effort is normal.     Breath sounds: Normal breath sounds.  No wheezing.  Abdominal:     General: Bowel sounds are normal.     Palpations: Abdomen is soft.     Tenderness: There is no abdominal tenderness. There is no guarding or rebound.  Musculoskeletal:        General: Swelling present. No deformity.     Right lower leg: Edema present.     Left lower leg: Edema present.     Comments: Bilateral ankle and lower leg edema.  Dorsalis pedal pulses are present.  There is a small 0.5 cm ulceration right medial mid calf with clear but yellow-tinged drainage.  Patient has moderate erythema of her bilateral lower legs from her ankles to her upper tibia area.  There is increased warmth bilaterally. (Patient reports this is chronic but maybe a little worse this week).  Skin:    General: Skin is warm and dry.  Neurological:     Mental Status: She is alert.      ED Treatments / Results  Labs (all labs ordered are listed, but only abnormal results are displayed) Labs Reviewed  BASIC METABOLIC PANEL - Abnormal; Notable for the following components:      Result Value   Potassium 3.3 (*)    All other components within normal limits  URINALYSIS, ROUTINE W REFLEX MICROSCOPIC - Abnormal; Notable for the following components:   Color, Urine STRAW (*)    Specific Gravity, Urine 1.002 (*)    All other components within normal limits  CBC WITH DIFFERENTIAL/PLATELET  TROPONIN I    EKG None  Radiology Dg Chest Portable 1 View  Result Date: 09/12/2018 CLINICAL DATA:  Syncope, fall. EXAM: PORTABLE CHEST 1 VIEW COMPARISON:  Radiographs of July 01, 2018. FINDINGS: The heart size and mediastinal contours are within normal limits. Both lungs are clear. The visualized skeletal structures are unremarkable. IMPRESSION: No active disease. Electronically Signed   By: Marijo Conception  M.D.   On: 09/12/2018 10:30   Dg Shoulder Left  Result Date: 09/12/2018 CLINICAL DATA:  Left shoulder pain after fall. EXAM: LEFT SHOULDER - 2+ VIEW COMPARISON:  None. FINDINGS: There is no evidence of fracture or dislocation. There is no evidence of arthropathy or other focal bone abnormality. Soft tissues are unremarkable. IMPRESSION: Negative. Electronically Signed   By: Marijo Conception M.D.   On: 09/12/2018 10:31    Procedures Procedures (including critical care time)  Medications Ordered in ED Medications  oxyCODONE (Oxy IR/ROXICODONE) immediate release tablet 15 mg (has no administration in time range)     Initial Impression / Assessment and Plan / ED Course  I have reviewed the triage vital signs and the nursing notes.  Pertinent labs & imaging results that were available during my care of the patient were reviewed by me and considered in my medical decision making (see chart for details).        RN went into pt room and she was not not responsive with eyes closed, although she kept her arms elevated when raised by RN.  She was given ammonia inhalation and woke, angry that her myocardial perfusion test hasn't been done yet, and " I have been lying unconscious in here and no one checked on me".  Pt was on monitor entire ed stay with no alarms.  I came to bedside to discuss ed test results.  She became angry because tests were ok.  She refused to stay long enough for [place] to talk to cardiology, left ama.  Call to Dr. Bronson Ing, advised  of pt's ed visit and dispo.  He will have office contact her to reschedule her needed test.  Final Clinical Impressions(s) / ED Diagnoses   Final diagnoses:  Syncope and collapse  Acute pain of left shoulder    ED Discharge Orders    None       Landis Martins 09/12/18 [place], MD 09/22/18 [phone]

## 2018-09-12 NOTE — ED Triage Notes (Addendum)
Pt was scheduled for a stress test and upset she had to wear mask. Staff reports she said she couldn't breathe and went limp.Pt says she is suppose to have portable O2 but only has large tanks and cant move them  And she was told she would have O2 when she got here but staff put mask on. And no O2  She said she went blank and staff got her on stretcher and brought her here. Pt states her left shoulder hurt after staff got her up

## 2018-09-16 ENCOUNTER — Telehealth: Payer: Medicare Other | Admitting: Cardiology

## 2018-09-16 ENCOUNTER — Other Ambulatory Visit: Payer: Self-pay

## 2018-09-22 ENCOUNTER — Telehealth: Payer: Medicare Other | Admitting: Cardiology

## 2018-09-22 ENCOUNTER — Encounter: Payer: Self-pay | Admitting: Cardiology

## 2018-09-22 NOTE — Progress Notes (Signed)
    Patient called 3 times over a 20 minute to her cellphone, unable to reach. She will need to reschedule her appt.    [name], M.D.

## 2018-09-23 ENCOUNTER — Other Ambulatory Visit: Payer: Self-pay

## 2018-09-23 ENCOUNTER — Telehealth: Payer: Medicare Other | Admitting: Cardiology

## 2018-09-29 ENCOUNTER — Telehealth: Payer: Self-pay | Admitting: Orthopaedic Surgery

## 2018-09-29 ENCOUNTER — Telehealth: Payer: Self-pay | Admitting: Cardiovascular Disease

## 2018-09-29 ENCOUNTER — Encounter: Payer: Self-pay | Admitting: *Deleted

## 2018-09-29 MED ORDER — OXYCODONE HCL 15 MG PO TABS: ORAL_TABLET | ORAL | 0 refills | Status: DC

## 2018-09-29 NOTE — Telephone Encounter (Signed)
Pt upset about recent ED visit - says she was supposed to have stress test but passed out and woke up in ED - wanted to know why Dr Harl Bowie wasn't contacted - advised pt that ED was waiting on cardiology consult and she left AMA before they could discuss with cardiology (per ED notes pt was argumentative during ED stay) - pt wanted to make appt with Dr Harl Bowie - aware that she missed 6/22 telehealth appt says she was in the hospital in [facility] for mental problems - also went to [facility] after [facility] as well - wants to be seen to discuss her chronic chest pain - scheduled telehealth appt with Bernerd Pho, [place] 7/23 (pt doesn't want to come into office cannot breathe with mask on and doesn't have portable oxygen)

## 2018-09-29 NOTE — Telephone Encounter (Signed)
Patient called requesting to speak with Dr. Bronson Ing in regards to her recent stress test being cancelled due to her passing out. Patient is concerned that on the same evening  09/12/2018 she passed out again. Was taken by EMS to [facility]. Patient is very concerned about why she was so brusied when she left the ER at [facility].  [phone]

## 2018-09-29 NOTE — Telephone Encounter (Signed)
Patient requests refill on Oxycodone (Roxicodone) 15 mgs. immediate release tablet Qty 140  Sig: TAKE 1 TABLET BY MOUTH EVERY FIVE HOURS AS NEEDED FOR PAIN  Patient states she uses [facility]

## 2018-10-06 ENCOUNTER — Ambulatory Visit (INDEPENDENT_AMBULATORY_CARE_PROVIDER_SITE_OTHER): Payer: Medicare Other | Admitting: Internal Medicine

## 2018-10-08 ENCOUNTER — Ambulatory Visit (INDEPENDENT_AMBULATORY_CARE_PROVIDER_SITE_OTHER): Payer: Medicare Other | Admitting: Orthopaedic Surgery

## 2018-10-08 ENCOUNTER — Encounter: Payer: Self-pay | Admitting: Orthopaedic Surgery

## 2018-10-08 ENCOUNTER — Other Ambulatory Visit: Payer: Self-pay

## 2018-10-08 ENCOUNTER — Telehealth: Payer: Self-pay | Admitting: Orthopaedic Surgery

## 2018-10-08 VITALS — BP 113/73 | HR 78 | Wt 157.0 lb

## 2018-10-08 DIAGNOSIS — G894 Chronic pain syndrome: Secondary | ICD-10-CM | POA: Diagnosis not present

## 2018-10-08 DIAGNOSIS — G8929 Other chronic pain: Secondary | ICD-10-CM

## 2018-10-08 DIAGNOSIS — M25512 Pain in left shoulder: Secondary | ICD-10-CM | POA: Diagnosis not present

## 2018-10-08 NOTE — Progress Notes (Signed)
PROCEDURE NOTE:  The patient request injection, verbal consent was obtained.  The left shoulder was prepped appropriately after time out was performed.   Sterile technique was observed and injection of 1 cc of Depo-Medrol 40 mg with several cc's of plain xylocaine. Anesthesia was provided by ethyl chloride and a 20-gauge needle was used to inject the shoulder area. A posterior approach was used.  The injection was tolerated well.  A band aid dressing was applied.  The patient was advised to apply ice later today and tomorrow to the injection sight as needed.  Return in one month.  Electronically Signed Sanjuana Kava, MD 7/8/20209:44 AM

## 2018-10-08 NOTE — Patient Instructions (Signed)

## 2018-10-08 NOTE — Telephone Encounter (Signed)
[name] called and wanted to know if you remember the name of the "arthritis shot" that is given once a year.  She said you had suggested it to her previously and she did have this done last year but she cant remember the name of it.  She said she is now seeing [name], NP and he doesn't have a clue as to what she is talking about.    Do you know the name of this medication?

## 2018-10-09 NOTE — Telephone Encounter (Signed)
I am not familiar with this type of injection

## 2018-10-23 ENCOUNTER — Encounter: Payer: Self-pay | Admitting: Student

## 2018-10-23 ENCOUNTER — Other Ambulatory Visit: Payer: Self-pay

## 2018-10-23 ENCOUNTER — Telehealth (INDEPENDENT_AMBULATORY_CARE_PROVIDER_SITE_OTHER): Payer: Medicare Other | Admitting: Student

## 2018-10-23 VITALS — Ht 60.0 in | Wt 153.0 lb

## 2018-10-23 DIAGNOSIS — E039 Hypothyroidism, unspecified: Secondary | ICD-10-CM

## 2018-10-23 DIAGNOSIS — R079 Chest pain, unspecified: Secondary | ICD-10-CM

## 2018-10-23 DIAGNOSIS — I10 Essential (primary) hypertension: Secondary | ICD-10-CM

## 2018-10-23 DIAGNOSIS — R600 Localized edema: Secondary | ICD-10-CM | POA: Diagnosis not present

## 2018-10-23 MED ORDER — TRIAMTERENE-HCTZ 37.5-25 MG PO CAPS: 1.0000 | ORAL_CAPSULE | Freq: Every day | ORAL | 11 refills | Status: DC

## 2018-10-23 MED ORDER — ISOSORBIDE MONONITRATE ER 30 MG PO TB24: 30.0000 mg | ORAL_TABLET | Freq: Every day | ORAL | 3 refills | Status: DC

## 2018-10-23 NOTE — Progress Notes (Signed)
Virtual Visit via Telephone Note   This visit type was conducted due to national recommendations for restrictions regarding the COVID-19 Pandemic (e.g. social distancing) in an effort to limit this patient's exposure and mitigate transmission in our community.  Due to her co-morbid illnesses, this patient is at least at moderate risk for complications without adequate follow up.  This format is felt to be most appropriate for this patient at this time.  The patient did not have access to video technology/had technical difficulties with video requiring transitioning to audio format only (telephone).  All issues noted in this document were discussed and addressed.  No physical exam could be performed with this format.  Please refer to the patient's chart for her  consent to telehealth for [facility]   Date:  10/23/2018   ID:  [name], DOB 1960/06/22, MRN [id number]  Patient Location: Home Provider Location: Office  PCP:  Wyatt Haste, NP  Cardiologist:  Carlyle Dolly, MD  Electrophysiologist:  None   Evaluation Performed:  Follow-Up Visit  Chief Complaint: Chest Pain  History of Present Illness:    [name] is a 58 y.o. female with past medical history of HTN, hypothyroidism, lower extremity edema, and chronic back pain who presents for a follow-up telehealth visit.  She most recently had a phone visit with Dr. Harl Bowie on 08/15/2018 and reported intermittent episodes of chest discomfort for the past several months which could occur at rest or with activity. Given her multiple cardiac risk factors including HTN, diet-controlled Type II DM, and prior tobacco use a Lexiscan Myoview and echocardiogram were recommended for further evaluation. Her echocardiogram showed a preserved EF of 60 to 65% with no regional wall motion abnormalities and no significant valve abnormalities. She presented to [facility] on 09/12/2018 for her stress test but experienced syncope upon arriving  to the hospital and having to wear a facemask. Was taken to the ED where her EKG showed no acute changes and troponin values were negative. She left AMA prior to a full work-up being obtained. A follow-up telehealth visit was arranged with Dr. Harl Bowie on 09/22/2018 but she did not answer.   In talking with the patient today, she reports still having chronic back pain and shoulder pain which is followed by Orthopedics. She is frustrated that her PCP tries to reduce her pain medication as she reports dealing with arthritis since she was 58 years old. She was frustrated at the time of her stress test as she was informed she would have supplemental oxygen provided at that time due to not being able to tolerate a face mask without it. Supplemental oxygen was not provided at the entrance and upon putting on a mask she passed out as outlined above.  She reports still having episodes of a stabbing pain under left breast which can occur at rest or with activity. When this occurs she feels like her "legs are in hot coals". She reports sometimes passing out when this occurs as the pain along her entire body intensifies. Can occur at rest or with activity. Has baseline dyspnea on exertion but denies any orthopnea, PND, or edema.   She does reports chronic constipation and has follow-up with Dr. Laural Golden scheduled for next month. Denies any melena or hematochezia.   The patient does not have symptoms concerning for COVID-19 infection (fever, chills, cough, or new shortness of breath).    Past Medical History:  Diagnosis Date  . Back pain   . Depression   .  GERD (gastroesophageal reflux disease)   . Hypertension    diet control  . Hypothyroid   . IBS (irritable bowel syndrome)   . Migraines   . Obesity   . Osteoarthritis   . Sleep apnea    Past Surgical History:  Procedure Laterality Date  . AMPUTATION  12/13/2011   Procedure: AMPUTATION DIGIT;  Surgeon: Marcheta Grammes, DPM;  Location: [facility];   Service: Orthopedics;  Laterality: Right;  amputation second toe right foot  . BACK SURGERY    . BIOPSY  12/23/2015   Procedure: BIOPSY;  Surgeon: Rogene Houston, MD;  Location: [facility];  Service: Endoscopy;;  pre pyloric patches  . BREAST BIOPSY    . CHOLECYSTECTOMY    . CYSTOSCOPY W/ URETERAL STENT PLACEMENT Left 10/09/2017   Procedure: CYSTOSCOPY WITH RETROGRADE PYELOGRAM/URETERAL STENT PLACEMENT;  Surgeon: Cleon Gustin, MD;  Location: [facility];  Service: Urology;  Laterality: Left;  . CYSTOSCOPY WITH RETROGRADE PYELOGRAM, URETEROSCOPY AND STENT PLACEMENT Left 10/23/2017   Procedure: CYSTOSCOPY WITH LEFT RETROGRADE PYELOGRAM, LEFT URETEROSCOPY AND STENT EXCHANGE;  Surgeon: Cleon Gustin, MD;  Location: [facility];  Service: Urology;  Laterality: Left;  . ESOPHAGOGASTRODUODENOSCOPY  05/28/2011   Procedure: ESOPHAGOGASTRODUODENOSCOPY (EGD);  Surgeon: Rogene Houston, MD;  Location: [facility];  Service: Endoscopy;  Laterality: N/A;  730  . ESOPHAGOGASTRODUODENOSCOPY N/A 10/09/2013   Procedure: ESOPHAGOGASTRODUODENOSCOPY (EGD);  Surgeon: Rogene Houston, MD;  Location: [facility];  Service: Endoscopy;  Laterality: N/A;  730  . ESOPHAGOGASTRODUODENOSCOPY (EGD) WITH PROPOFOL N/A 12/23/2015   Procedure: ESOPHAGOGASTRODUODENOSCOPY (EGD) WITH PROPOFOL;  Surgeon: Rogene Houston, MD;  [facility];  Service: Endoscopy;  Laterality: N/A;  . HERNIA REPAIR    . HOLMIUM LASER APPLICATION Left 4/69/6295   Procedure: LEFT URETEROSCOPY WITH HOLMIUM LASER LITHOTRIPSY;  Surgeon: Cleon Gustin, MD;  Location: [facility];  Service: Urology;  Laterality: Left;  Marland Kitchen MALONEY DILATION N/A 10/09/2013   Procedure: MALONEY DILATION;  Surgeon: Rogene Houston, MD;  Location: [facility];  Service: Endoscopy;  Laterality: N/A;  . NASAL SEPTUM SURGERY    . NECK SURGERY     2 yrs ago  . TOTAL ABDOMINAL HYSTERECTOMY     Left oophroectomy for a lare tumor about 21 yrs. RT ovary removed time of  hysterectomy     Current Meds  Medication Sig  . beta carotene w/minerals (OCUVITE) tablet Take 1 tablet by mouth daily.    . diclofenac sodium (VOLTAREN) 1 % GEL apply FOUR grams TO affected joints AS NEEDED FOUR TIMES DAILY  . Docusate Sodium 100 MG capsule Take 100 mg by mouth 2 (two) times daily.  Marland Kitchen esomeprazole (NEXIUM) 40 MG capsule Take 1 capsule (40 mg total) by mouth daily before breakfast.  . hydrOXYzine (VISTARIL) 25 MG capsule Take 25 mg by mouth every 6 (six) hours as needed for anxiety.   Marland Kitchen levothyroxine (SYNTHROID, LEVOTHROID) 112 MCG tablet Take 112 mcg by mouth daily before breakfast.   . LINZESS 290 MCG CAPS capsule Take 290 mcg by mouth daily.  . metoprolol tartrate (LOPRESSOR) 25 MG tablet Take 25 mg by mouth 2 (two) times daily.  . Multiple Vitamins-Iron (MULTIVITAMINS WITH IRON) TABS Take 1 tablet by mouth daily.  Marland Kitchen MYRBETRIQ 25 MG TB24 tablet Take 25 mg by mouth daily.  . naloxone (NARCAN) nasal spray 4 mg/0.1 mL CALL 911. ADMINISTER A SINGLE SPRAY OF NARCAN IN ONE NOSTRIL, REPEAT EVERY 3 MINUTES AS NEEDED IF  NO OR MINIMAL RESPONSE  . ondansetron (ZOFRAN ODT) 4 MG disintegrating tablet Take 1 tablet (4 mg total) by mouth every 8 (eight) hours as needed for nausea or vomiting.  Marland Kitchen oxyCODONE (ROXICODONE) 15 MG immediate release tablet TAKE 1 TABLET BY MOUTH EVERY FIVE HOURS AS NEEDED FOR PAIN  . polyethylene glycol (MIRALAX / GLYCOLAX) packet Take 17 g by mouth 2 (two) times daily before a meal.    . potassium chloride (MICRO-K) 10 MEQ CR capsule Take 10 mEq by mouth 2 (two) times daily.  . pregabalin (LYRICA) 100 MG capsule Take 100 mg by mouth 2 (two) times daily. *May take three times daily  . silver sulfADIAZINE (SSD) 1 % cream APPLY TO THE AFFECTED AREA(S) TWICE DAILY UNTIL HEALED  . sucralfate (CARAFATE) 1 g tablet TAKE 1 TABLET BY MOUTH FOUR TIMES DAILY WITH MEALS AND AT BEDTIME  . SUMAtriptan (IMITREX) 100 MG tablet TAKE 1 TABLET BY MOUTH AT ONSET OF HEADACHE. MAY  REPEAT ONCE IN TWO HOURS. NO MORE THAN TWO TABLETS IN 24 HOURS  . tiZANidine (ZANAFLEX) 4 MG tablet One by mouth every eight hours as needed for spasm.  . topiramate (TOPAMAX) 100 MG tablet Take 100 mg by mouth 2 (two) times daily.    . traZODone (DESYREL) 50 MG tablet Take 150 mg by mouth at bedtime.  . triamterene-hydrochlorothiazide (DYAZIDE) 37.5-25 MG capsule Take 1 each (1 capsule total) by mouth daily.  . VENTOLIN HFA 108 (90 Base) MCG/ACT inhaler Inhale 1-2 puffs into the lungs every 6 (six) hours as needed for wheezing or shortness of breath.   . Zolpidem Tartrate (AMBIEN PO) Take by mouth.  . [DISCONTINUED] triamterene-hydrochlorothiazide (DYAZIDE) 37.5-25 MG capsule Take 1 capsule by mouth daily.     Allergies:   Cephalexin, Latex, Betadine [povidone iodine], Cleocin [clindamycin hcl], Other, Sulfa antibiotics, and Toradol [ketorolac tromethamine]   Social History   Tobacco Use  . Smoking status: Current Every Day Smoker    Packs/day: 0.50    Years: 35.00    Pack years: 17.50  . Smokeless tobacco: Never Used  . Tobacco comment: 1 pack a day since age18  Substance Use Topics  . Alcohol use: No  . Drug use: No     Family Hx: The patient's family history includes CAD in her mother.  ROS:   Please see the history of present illness.     All other systems reviewed and are negative.   Prior CV studies:   The following studies were reviewed today:  Echocardiogram: 09/02/2018 IMPRESSIONS    1. The left ventricle has normal systolic function with an ejection fraction of 60-65%. The cavity size was normal. Left ventricular diastolic parameters were normal. No evidence of left ventricular regional wall motion abnormalities.  2. The right ventricle has normal systolic function. The cavity was normal. There is mildly increased right ventricular wall thickness.  3. Left atrial size was mildly dilated.  4. The mitral valve is grossly normal. There is mild mitral annular  calcification present.  5. The tricuspid valve is grossly normal.  6. The aortic valve is grossly normal.  7. The aortic root is normal in size and structure.  8. The inferior vena cava was dilated in size with <50% respiratory variability.  Labs/Other Tests and Data Reviewed:    EKG:  An ECG dated 09/12/2018 was personally reviewed today and demonstrated:  NSR, HR 67, with no acute ST changes when compared to prior changes.   Recent Labs: 09/12/2018: BUN 8;  Creatinine, Ser 0.66; Hemoglobin 13.0; Platelets 210; Potassium 3.3; Sodium 141   Recent Lipid Panel Lab Results  Component Value Date/Time   CHOL  10/16/2006 01:39 AM    145        ATP III CLASSIFICATION:  <200     mg/dL   Desirable  200-239  mg/dL   Borderline High  >=240    mg/dL   High   TRIG 408 (H) 10/16/2006 01:39 AM   HDL <10 (L) 10/16/2006 01:39 AM   CHOLHDL NOT CALCULATED 10/16/2006 01:39 AM   LDLCALC  10/16/2006 01:39 AM    UNABLE TO CALCULATE IF TRIGLYCERIDE OVER 400 mg/dL        Total Cholesterol/HDL:CHD Risk Coronary Heart Disease Risk Table                     Men   Women  1/2 Average Risk   3.4   3.3    Wt Readings from Last 3 Encounters:  10/23/18 153 lb (69.4 kg)  10/08/18 157 lb (71.2 kg)  09/22/18 150 lb (68 kg)     Objective:    Vital Signs:  Ht 5' (1.524 m)   Wt 153 lb (69.4 kg)   BMI 29.88 kg/m    General: Pleasant female sounding in NAD Psych: Normal affect. Neuro: Alert and oriented X 3. Lungs:  Resp regular and unlabored while talking on the phone.   ASSESSMENT & PLAN:    1. Chest Pain with Atypical Features - Reports episodes of stabbing pain under her left breast which can occur at rest or with activity and lasts from seconds up to hours at a time. This is not associated with exertion or positional changes. She does report paresthesias along her legs when this occurs. Recent echocardiogram showed a preserved EF with no regional WMA as outlined above. Unable to undergo stress testing  as planned last month as she passed out at the entrance of the hospital after putting on a mask.  - reviewed options with the patient and she is hesitant to present back to [facility] for testing given her recent issues. We reviewed that tests are also performed in [place] but she declined to go there as well. Will therefore plan a trial of medical therapy and start Imdur 30mg  daily. Reviewed she can take 15mg  daily if she experiences headaches and titrate up to 30mg  daily. Continue BB therapy. She has scheduled follow-up in 8 weeks so will keep that appointment for reassessment of her symptoms and consideration of rescheduling her stress test if symptoms have not improved and patient in agreement.   2. Lower Extremity Edema - reports her edema had worsened but she was switched from Torsemide to Triamterene-HCTZ by her PCP with improvement in her edema. Weight has declined by 4 lbs on her home scales and is now at 153 lbs. Labs recently checked on 09/23/2018 and showed stable kidney function and electrolytes. Na+ was slightly low at 133 so will need to follow given the recent adjustment of her diuretic.   3. HTN - she has a BP cuff at home but does not check this regularly. Says it was "good" the last time she checked it. Currently on Lopressor 25mg  BID and Triamterene-HCTZ 37.5-25mg  daily. Will add Imdur as outlined above. I encouraged her to keep a log of her BP readings and report back with these.   4. Hypothyroidism - followed by PCP. Remains on Synthroid 112 mcg daily.   COVID-19 Education: The  signs and symptoms of COVID-19 were discussed with the patient and how to seek care for testing (follow up with PCP or arrange E-visit).  The importance of social distancing was discussed today.  Time:   Today, I have spent 36 minutes with the patient with telehealth technology discussing the above problems.     Medication Adjustments/Labs and Tests Ordered: Current medicines are reviewed at length  with the patient today.  Concerns regarding medicines are outlined above.   Tests Ordered: No orders of the defined types were placed in this encounter.   Medication Changes: Meds ordered this encounter  Medications  . isosorbide mononitrate (IMDUR) 30 MG 24 hr tablet    Sig: Take 1 tablet (30 mg total) by mouth daily.    Dispense:  90 tablet    Refill:  3  . triamterene-hydrochlorothiazide (DYAZIDE) 37.5-25 MG capsule    Sig: Take 1 each (1 capsule total) by mouth daily.    Dispense:  30 capsule    Refill:  11    Follow Up: Keep scheduled follow-up with Dr. Harl Bowie in 12/2018  Signed, Erma Heritage, PA-C  10/23/2018 1:30 PM    [place]

## 2018-10-23 NOTE — Patient Instructions (Signed)
Medication Instructions:  Your physician has recommended you make the following change in your medication:  Imdur   If you need a refill on your cardiac medications before your next appointment, please call your pharmacy.   Lab work: NONE  If you have labs (blood work) drawn today and your tests are completely normal, you will receive your results only by: Marland Kitchen MyChart Message (if you have MyChart) OR . A paper copy in the mail If you have any lab test that is abnormal or we need to change your treatment, we will call you to review the results.  Testing/Procedures: NONE   Follow-Up: At [facility], you and your health needs are our priority.  As part of our continuing mission to provide you with exceptional heart care, we have created designated Provider Care Teams.  These Care Teams include your primary Cardiologist (physician) and Advanced Practice Providers (APPs -  Physician Assistants and Nurse Practitioners) who all work together to provide you with the care you need, when you need it. You will need a follow up appointment in September .  Please call our office 2 months in advance to schedule this appointment.  You may see Carlyle Dolly, MD or one of the following Advanced Practice Providers on your designated Care Team:   Bernerd Pho, PA-C [facility]) . Ermalinda Barrios, PA-C ([place])  Any Other Special Instructions Will Be Listed Below (If Applicable). Thank you for choosing [facility]!

## 2018-10-27 ENCOUNTER — Telehealth: Payer: Self-pay | Admitting: Cardiovascular Disease

## 2018-10-27 ENCOUNTER — Telehealth: Payer: Self-pay | Admitting: Orthopaedic Surgery

## 2018-10-27 NOTE — Telephone Encounter (Signed)
Started Imdur on 10/23/18 - started at the 30mg .  Took x 3 days, then stopped.  Could not tolerate due to causing severe headache.  States that she has to deal with migraines all the time.  Advised to restart at 1/2 tab (15mg ) daily & if can't tolerate, she will call back.  She verbalized understanding.

## 2018-10-27 NOTE — Telephone Encounter (Signed)
No answer

## 2018-10-27 NOTE — Telephone Encounter (Signed)
Patient requests refill on Oxycodone 15 mgs.   Qty  140  Sig: TAKE 1 TABLET BY MOUTH EVERY FIVE HOURS AS NEEDED FOR PAIN        Patient states she uses [name] in [place]

## 2018-10-27 NOTE — Telephone Encounter (Signed)
Patient is having problems with new medication that was started at last visit

## 2018-10-28 MED ORDER — OXYCODONE HCL 15 MG PO TABS: ORAL_TABLET | ORAL | 0 refills | Status: DC

## 2018-10-31 ENCOUNTER — Telehealth: Payer: Self-pay | Admitting: *Deleted

## 2018-10-31 MED ORDER — AMLODIPINE BESYLATE 2.5 MG PO TABS: 2.5000 mg | ORAL_TABLET | Freq: Every day | ORAL | 1 refills | Status: DC

## 2018-10-31 NOTE — Telephone Encounter (Signed)
Pt voiced understanding - amlodipine sent to pharmacy

## 2018-10-31 NOTE — Telephone Encounter (Signed)
Can stop imdur, try norvac 2.5mg  daily as alternative   Zandra Abts MD

## 2018-10-31 NOTE — Telephone Encounter (Signed)
Pt c/o painful headaches/dizziness where she has to lay down with light off. Was started on Imdur 30 mg but could not tolerate so tried  decrease of Imdur 15 mg - says Imdur did help with chest pain but still has some tightness - doesn't think she can tolerate Imdur - also says has extremely dry and cracked hands that didn't start until she started Imdur

## 2018-11-05 ENCOUNTER — Ambulatory Visit (INDEPENDENT_AMBULATORY_CARE_PROVIDER_SITE_OTHER): Payer: Medicare Other | Admitting: Orthopaedic Surgery

## 2018-11-05 ENCOUNTER — Encounter: Payer: Self-pay | Admitting: Orthopaedic Surgery

## 2018-11-05 ENCOUNTER — Other Ambulatory Visit: Payer: Self-pay

## 2018-11-05 DIAGNOSIS — G894 Chronic pain syndrome: Secondary | ICD-10-CM

## 2018-11-05 DIAGNOSIS — G8929 Other chronic pain: Secondary | ICD-10-CM

## 2018-11-05 DIAGNOSIS — G609 Hereditary and idiopathic neuropathy, unspecified: Secondary | ICD-10-CM

## 2018-11-05 DIAGNOSIS — F17210 Nicotine dependence, cigarettes, uncomplicated: Secondary | ICD-10-CM

## 2018-11-05 DIAGNOSIS — M25512 Pain in left shoulder: Secondary | ICD-10-CM

## 2018-11-05 NOTE — Progress Notes (Signed)
PROCEDURE NOTE:  The patient request injection, verbal consent was obtained.  The left shoulder was prepped appropriately after time out was performed.   Sterile technique was observed and injection of 1 cc of Depo-Medrol 40 mg with several cc's of plain xylocaine. Anesthesia was provided by ethyl chloride and a 20-gauge needle was used to inject the shoulder area. A posterior approach was used.  The injection was tolerated well.  A band aid dressing was applied.  The patient was advised to apply ice later today and tomorrow to the injection sight as needed.  Return in one month.  Call if any problem.  Precautions discussed.   Electronically Signed Sanjuana Kava, MD 8/5/20209:53 AM

## 2018-11-06 NOTE — Addendum Note (Signed)
Addended by: Derek Mound A on: 11/06/2018 12:02 PM   Modules accepted: Orders

## 2018-11-19 ENCOUNTER — Telehealth: Payer: Self-pay | Admitting: Orthopaedic Surgery

## 2018-11-19 NOTE — Telephone Encounter (Signed)
Patient will need a refill on her medication when you are gone next week.  Could we send this prescription to the pharmacy to hold until its time to be filled?  Oxyodone 15 mgs.  Qty  140  Sig: TAKE 1 TABLET BY MOUTH EVERY FIVE HOURS AS NEEDED FOR PAIN

## 2018-11-20 MED ORDER — OXYCODONE HCL 15 MG PO TABS: ORAL_TABLET | ORAL | 0 refills | Status: DC

## 2018-11-20 NOTE — Addendum Note (Signed)
Addended by: Willette Pa on: 11/20/2018 11:49 AM   Modules accepted: Orders

## 2018-11-24 ENCOUNTER — Ambulatory Visit (INDEPENDENT_AMBULATORY_CARE_PROVIDER_SITE_OTHER): Payer: Medicare Other | Admitting: Internal Medicine

## 2018-11-24 ENCOUNTER — Telehealth: Payer: Self-pay | Admitting: Cardiology

## 2018-11-24 MED ORDER — FUROSEMIDE 40 MG PO TABS: ORAL_TABLET | ORAL | 0 refills | Status: DC

## 2018-11-24 NOTE — Telephone Encounter (Signed)
Pt says weight is up 171lbs today says it was 168lbs on Friday evening c/o swelling in the legs and some SOB BP 132/80 HR 72 - disposed of her pills bottles by mistake and thinks she is taking Dyazide 37.5/25 mg daily as ordered in July.

## 2018-11-24 NOTE — Telephone Encounter (Signed)
Have her take Lasix 40 mg today and tomorrow along with her supplemental potassium and to continue to weigh daily and monitor her leg swelling.

## 2018-11-24 NOTE — Telephone Encounter (Signed)
Patient has question about her medication that was just filled.   Stated pill doesn't look the same

## 2018-11-24 NOTE — Telephone Encounter (Signed)
Pt voiced understanding and will update [place] in few days

## 2018-11-30 ENCOUNTER — Other Ambulatory Visit: Payer: Self-pay | Admitting: Orthopaedic Surgery

## 2018-12-01 ENCOUNTER — Other Ambulatory Visit: Payer: Self-pay

## 2018-12-01 ENCOUNTER — Encounter (INDEPENDENT_AMBULATORY_CARE_PROVIDER_SITE_OTHER): Payer: Self-pay | Admitting: Internal Medicine

## 2018-12-01 ENCOUNTER — Ambulatory Visit (INDEPENDENT_AMBULATORY_CARE_PROVIDER_SITE_OTHER): Payer: Medicare Other | Admitting: Internal Medicine

## 2018-12-01 VITALS — BP 130/78 | HR 71 | Temp 97.9°F | Ht 60.0 in | Wt 171.6 lb

## 2018-12-01 DIAGNOSIS — R112 Nausea with vomiting, unspecified: Secondary | ICD-10-CM | POA: Diagnosis not present

## 2018-12-01 DIAGNOSIS — K5909 Other constipation: Secondary | ICD-10-CM

## 2018-12-01 MED ORDER — MOTEGRITY 2 MG PO TABS: 2.0000 mg | ORAL_TABLET | Freq: Every day | ORAL | 5 refills | Status: AC

## 2018-12-01 NOTE — Progress Notes (Signed)
Presenting complaint;  Follow-up for nausea vomiting abdominal pain and constipation.  Database and subjective:  Patient is 58 year old Caucasian female with multiple medical problems who is here for scheduled visit.  She was last seen in April 2019.  She has history of chronic nausea vomiting and abdominal pain as well as constipation. She had EGD in September 2017 which revealed portal hypertensive gastropathy and prepyloric intestinal metaplasia but no evidence of peptic ulcer disease.  In March 2018 her examination suggests vague abdominal mass in right mid abdomen but abdominal pelvic CT was unremarkable and I felt she may have pseudomass due to adhesions.  Finally she had small bowel follow-through in April 2018 with very slow transit time. I felt her chronic nausea vomiting and constipation were due to intestinal dysmotility or GI pseudoobstruction.  She has been maintained on Linzess.  When she was seen in April 2019 she was complaining of abdominal pain.  There was question of pancreatitis but her LFTs and serum amylase were normal.  Now she returns with multiple complaints. She states she was doing well until her new primary care physician started to change her medications.  She says her sleeping pill dose has been reduced her patches been taken away and she feels miserable.  Her bowels are not moving anymore.  She has constant nausea and she may vomit 4-5 times in a month.  In addition to [place] she is having to take MiraLAX every other day.  She says she has called multiple other offices and she has not been able to make an appointment to see another physician.  She says because of her chronic back she is not able to travel outside the county.  She denies melena or rectal bleeding.  Her appetite is fair.  She has gained 27 pounds since her last visit of April 2019.  Several years ago she used to weigh over 200 pounds.   Current Medications: Outpatient Encounter Medications as of 12/01/2018   Medication Sig  . amLODipine (NORVASC) 2.5 MG tablet Take 1 tablet (2.5 mg total) by mouth daily.  . beta carotene w/minerals (OCUVITE) tablet Take 1 tablet by mouth daily.    . diclofenac sodium (VOLTAREN) 1 % GEL apply FOUR grams TO affected joints AS NEEDED FOUR TIMES DAILY  . Docusate Sodium 100 MG capsule Take 100 mg by mouth 2 (two) times daily.  . DULoxetine (CYMBALTA) 20 MG capsule Take 20 mg by mouth 2 (two) times daily.  Marland Kitchen esomeprazole (NEXIUM) 40 MG capsule Take 1 capsule (40 mg total) by mouth daily before breakfast.  . furosemide (LASIX) 40 MG tablet TAKE 1 TABLET TODAY AND TOMORROW  . hydrOXYzine (VISTARIL) 25 MG capsule Take 25 mg by mouth every 6 (six) hours as needed for anxiety.   Marland Kitchen levothyroxine (SYNTHROID, LEVOTHROID) 112 MCG tablet Take 112 mcg by mouth daily before breakfast.   . LINZESS 290 MCG CAPS capsule Take 290 mcg by mouth daily.  . metoprolol tartrate (LOPRESSOR) 25 MG tablet Take 25 mg by mouth 2 (two) times daily.  . Multiple Vitamins-Iron (MULTIVITAMINS WITH IRON) TABS Take 1 tablet by mouth daily.  Marland Kitchen oxyCODONE (ROXICODONE) 15 MG immediate release tablet TAKE 1 TABLET BY MOUTH EVERY FIVE HOURS AS NEEDED FOR PAIN  . polyethylene glycol (MIRALAX / GLYCOLAX) packet Take 17 g by mouth 2 (two) times daily before a meal.    . pregabalin (LYRICA) 100 MG capsule Take 100 mg by mouth 2 (two) times daily. *May take three times daily  .  silver sulfADIAZINE (SSD) 1 % cream APPLY TO THE AFFECTED AREA(S) TWICE DAILY UNTIL HEALED  . SUMAtriptan (IMITREX) 100 MG tablet TAKE 1 TABLET BY MOUTH AT ONSET OF HEADACHE. MAY REPEAT ONCE IN TWO HOURS. NO MORE THAN TWO TABLETS IN 24 HOURS  . topiramate (TOPAMAX) 100 MG tablet Take 100 mg by mouth 2 (two) times daily.    . traZODone (DESYREL) 50 MG tablet Take 150 mg by mouth at bedtime.  . triamterene-hydrochlorothiazide (DYAZIDE) 37.5-25 MG capsule Take 1 each (1 capsule total) by mouth daily.  . VENTOLIN HFA 108 (90 Base) MCG/ACT  inhaler Inhale 1-2 puffs into the lungs every 6 (six) hours as needed for wheezing or shortness of breath.   . Zolpidem Tartrate (AMBIEN PO) Take 5 mg by mouth daily.   . sucralfate (CARAFATE) 1 g tablet TAKE 1 TABLET BY MOUTH FOUR TIMES DAILY WITH MEALS AND AT BEDTIME (Patient not taking: Reported on 12/01/2018)  . [DISCONTINUED] MYRBETRIQ 25 MG TB24 tablet Take 25 mg by mouth daily.  . [DISCONTINUED] naloxone (NARCAN) nasal spray 4 mg/0.1 mL CALL 911. ADMINISTER A SINGLE SPRAY OF NARCAN IN ONE NOSTRIL, REPEAT EVERY 3 MINUTES AS NEEDED IF NO OR MINIMAL RESPONSE  . [DISCONTINUED] ondansetron (ZOFRAN ODT) 4 MG disintegrating tablet Take 1 tablet (4 mg total) by mouth every 8 (eight) hours as needed for nausea or vomiting. (Patient not taking: Reported on 12/01/2018)  . [DISCONTINUED] potassium chloride (MICRO-K) 10 MEQ CR capsule Take 10 mEq by mouth 2 (two) times daily.  . [DISCONTINUED] tiZANidine (ZANAFLEX) 4 MG tablet One by mouth every eight hours as needed for spasm. (Patient not taking: Reported on 12/01/2018)   No facility-administered encounter medications on file as of 12/01/2018.      Objective: Blood pressure 130/78, pulse 71, temperature 97.9 F (36.6 C), temperature source Oral, height 5' (1.524 m), weight 171 lb 9.6 oz (77.8 kg). Patient is alert and in no acute distress. Conjunctiva is pink. Sclera is nonicteric Oropharyngeal mucosa is normal. No neck masses or thyromegaly noted. Cardiac exam with regular rhythm normal S1 and S2. No murmur or gallop noted. Lungs are clear to auscultation. Abdomen is full.  She has lower midline scar and she has multiple linear stria particularly in right upper quadrant.  Bowel sounds are normal.  On palpation abdomen is soft.  She has mild tenderness in right mid abdomen.  No masses noted.  Liver edge is 5 6 cm below RCM and felt to be due to hepato-ptosis. No LE edema or clubbing noted.  Labs/studies Results:  CBC Latest Ref Rng & Units  09/12/2018 10/08/2017 07/17/2017  WBC 4.0 - 10.5 K/uL 4.0 5.5 5.6  Hemoglobin 12.0 - 15.0 g/dL 13.0 12.8 13.1  Hematocrit 36.0 - 46.0 % 41.5 39.1 38.3  Platelets 150 - 400 K/uL 210 186 192    CMP Latest Ref Rng & Units 09/12/2018 10/08/2017 07/17/2017  Glucose 70 - 99 mg/dL 87 82 -  BUN 6 - 20 mg/dL 8 13 -  Creatinine 0.44 - 1.00 mg/dL 0.66 0.53 -  Sodium 135 - 145 mmol/L 141 136 -  Potassium 3.5 - 5.1 mmol/L 3.3(L) 3.3(L) -  Chloride 98 - 111 mmol/L 110 110 -  CO2 22 - 32 mmol/L 22 20(L) -  Calcium 8.9 - 10.3 mg/dL 9.1 8.1(L) -  Total Protein 6.1 - 8.1 g/dL - - 6.4  Total Bilirubin 0.2 - 1.2 mg/dL - - 0.3  Alkaline Phos 33 - 130 U/L - - -  AST  10 - 35 U/L - - 17  ALT 6 - 29 U/L - - 10    Hepatic Function Latest Ref Rng & Units 07/17/2017 12/13/2015 11/21/2006  Total Protein 6.1 - 8.1 g/dL 6.4 7.9 6.4  Albumin 3.6 - 5.1 g/dL - 3.8 2.6(L)  AST 10 - 35 U/L '17 21 31  '$ ALT 6 - 29 U/L '10 15 16  '$ Alk Phosphatase 33 - 130 U/L - 56 83  Total Bilirubin 0.2 - 1.2 mg/dL 0.3 0.4 0.7  Bilirubin, Direct 0.0 - 0.2 mg/dL 0.1 0.1 -      Assessment:  #1.  She has chronic GI symptoms including nausea vomiting and abdominal pain.  She has undergone fairly extensive work-up in the past.  She was doing well until her medications have been altered.  I am afraid I do not have any solution to this.  She just needs to discuss her concerns with her primary care physician. It is reassuring to note that she has gained 26 pounds since her last visit.  #2.  Chronic constipation.  Linzess is not working anymore and she is having to take polyethylene glycol.   Given history of GI dysmotility she may do well with prucalopride.   Plan:  Patient advised to contact Dr. Freddie Apley office if he is willing to see her for pain management. Discontinue Linzess/linaclotide. Prucalopride 2 mg p.o. every morning. Patient advised to call with progress report in few weeks. Office visit in 1 year or on as-needed basis.

## 2018-12-01 NOTE — Patient Instructions (Signed)
Please call office with progress report in 2 to 3 weeks. Can use Dulcolax suppository or Fleet enema on as-needed basis or when you have an urge and unable to have a bowel movement.

## 2018-12-02 ENCOUNTER — Encounter: Payer: Self-pay | Admitting: Orthopaedic Surgery

## 2018-12-03 ENCOUNTER — Ambulatory Visit (INDEPENDENT_AMBULATORY_CARE_PROVIDER_SITE_OTHER): Payer: Medicare Other | Admitting: Orthopaedic Surgery

## 2018-12-03 ENCOUNTER — Encounter: Payer: Self-pay | Admitting: Orthopaedic Surgery

## 2018-12-03 ENCOUNTER — Other Ambulatory Visit: Payer: Self-pay

## 2018-12-03 DIAGNOSIS — G8929 Other chronic pain: Secondary | ICD-10-CM | POA: Diagnosis not present

## 2018-12-03 DIAGNOSIS — M25512 Pain in left shoulder: Secondary | ICD-10-CM | POA: Diagnosis not present

## 2018-12-03 DIAGNOSIS — G894 Chronic pain syndrome: Secondary | ICD-10-CM

## 2018-12-03 DIAGNOSIS — F17210 Nicotine dependence, cigarettes, uncomplicated: Secondary | ICD-10-CM | POA: Diagnosis not present

## 2018-12-03 NOTE — Patient Instructions (Signed)

## 2018-12-03 NOTE — Progress Notes (Signed)
PROCEDURE NOTE:  The patient request injection, verbal consent was obtained.  The left shoulder was prepped appropriately after time out was performed.   Sterile technique was observed and injection of 1 cc of Depo-Medrol 40 mg with several cc's of plain xylocaine. Anesthesia was provided by ethyl chloride and a 20-gauge needle was used to inject the shoulder area. A posterior approach was used.  The injection was tolerated well.  A band aid dressing was applied.  The patient was advised to apply ice later today and tomorrow to the injection sight as needed.  The patient has read and signed an Opioid Treatment Agreement which has been scanned and added to the medical record.  The patient understands the agreement and agrees to abide with it.  The patient has chronic pain that is being treated with an opioid which relieves the pain.  The patient understands potential complications with chronic opioid treatment.   Return in one month.  Electronically Signed Sanjuana Kava, MD 9/2/202010:14 AM

## 2018-12-22 ENCOUNTER — Ambulatory Visit (INDEPENDENT_AMBULATORY_CARE_PROVIDER_SITE_OTHER): Payer: Medicare Other | Admitting: Cardiology

## 2018-12-22 ENCOUNTER — Encounter: Payer: Self-pay | Admitting: Cardiology

## 2018-12-22 ENCOUNTER — Other Ambulatory Visit: Payer: Self-pay

## 2018-12-22 ENCOUNTER — Encounter: Payer: Self-pay | Admitting: *Deleted

## 2018-12-22 ENCOUNTER — Telehealth: Payer: Self-pay | Admitting: Orthopaedic Surgery

## 2018-12-22 VITALS — BP 117/74 | HR 81 | Temp 99.1°F | Ht 60.0 in | Wt 166.4 lb

## 2018-12-22 DIAGNOSIS — R0789 Other chest pain: Secondary | ICD-10-CM

## 2018-12-22 DIAGNOSIS — R600 Localized edema: Secondary | ICD-10-CM | POA: Diagnosis not present

## 2018-12-22 NOTE — Progress Notes (Signed)
Clinical Summary [name] is a 58 y.o.female seen today for follow up of the following medical problems.   1. Chest pain - started about 4 months ago - initial episode occurred whle sleeping. Heaviness on chest radiating down left arm with associated numbness, and pain radiating to left chest. 20/10 in severity. +SOB. + diaphoresis. Worst with movement. Pain lasted 30 minutes. - has had repeat episodes since that time - chronic arthritis. Can have pain with position - increased DOE, recent bilaeral LE edema  CAD risk factors: HTN, diet controlled DM2 previously on insulin, +tobacco x 25 years. Mother "heart troubles" in her mid 57s, multiple siblings with heart troubles at early age. - echo 10/2016 LVEF 60-65%.  - no showed for lexiscan 01/2017    - 09/2018 echo LVEF 60-65%, normal RV function.   - was to have repeat stress test in 09/2018. Episode of syncope and SOB with wearing a facemask, seen in ER without evidence of ACS. She refused to reschedule test - last visit with [facility] emperic trial of imdur - headaches on imdur, she stopped taking. We changed to norvasc 2.5mg  daily  - chest pain has improved since last visit. No recent palpitations.    2. LE edema - chronic bilateral LE edema that has gotten worst - 133 lbs to 163 lbs - pcp increased lasix over time, taking lasix 40mg  tid.   - last visit we changed her to torsemide 40mg  bid. Weight was 163 lbs then,  - swelling is improving some, weight slowly trending down. 158-163 lbs. Shoes are fitting better.    09/2018 echo LVEF 60-65%, normal RV function.  - swelling has improved. Weight down to 166 lbs from 171   3. Chronic left shoulder pain - followed by pcp  4. Chronic neck/back pain - followed by pcp - prior surgeries    Past Medical History:  Diagnosis Date  . Back pain   . Depression   . GERD (gastroesophageal reflux disease)   . Hypertension    diet control  . Hypothyroid   .  IBS (irritable bowel syndrome)   . Migraines   . Obesity   . Osteoarthritis   . Sleep apnea      Allergies  Allergen Reactions  . Cephalexin Hives    unknown  . Latex Rash  . Betadine [Povidone Iodine] Rash  . Cleocin [Clindamycin Hcl] Hives  . Other     Band aides - rash   . Sulfa Antibiotics     Vomiting   . Toradol [Ketorolac Tromethamine] Nausea And Vomiting    migraine     Current Outpatient Medications  Medication Sig Dispense Refill  . amLODipine (NORVASC) 2.5 MG tablet Take 1 tablet (2.5 mg total) by mouth daily. 90 tablet 1  . beta carotene w/minerals (OCUVITE) tablet Take 1 tablet by mouth daily.      . diclofenac sodium (VOLTAREN) 1 % GEL apply FOUR grams TO affected joints AS NEEDED FOUR TIMES DAILY 500 g 5  . Docusate Sodium 100 MG capsule Take 100 mg by mouth 2 (two) times daily.    . DULoxetine (CYMBALTA) 20 MG capsule Take 20 mg by mouth 2 (two) times daily.    Marland Kitchen esomeprazole (NEXIUM) 40 MG capsule Take 1 capsule (40 mg total) by mouth daily before breakfast.    . furosemide (LASIX) 40 MG tablet TAKE 1 TABLET TODAY AND TOMORROW 2 tablet 0  . hydrOXYzine (VISTARIL) 25 MG capsule Take 25 mg by  mouth every 6 (six) hours as needed for anxiety.   5  . levothyroxine (SYNTHROID, LEVOTHROID) 112 MCG tablet Take 112 mcg by mouth daily before breakfast.     . metoprolol tartrate (LOPRESSOR) 25 MG tablet Take 25 mg by mouth 2 (two) times daily.  4  . Multiple Vitamins-Iron (MULTIVITAMINS WITH IRON) TABS Take 1 tablet by mouth daily.    Marland Kitchen oxyCODONE (ROXICODONE) 15 MG immediate release tablet TAKE 1 TABLET BY MOUTH EVERY FIVE HOURS AS NEEDED FOR PAIN 140 tablet 0  . polyethylene glycol (MIRALAX / GLYCOLAX) packet Take 17 g by mouth 2 (two) times daily before a meal.      . pregabalin (LYRICA) 100 MG capsule Take 100 mg by mouth 2 (two) times daily. *May take three times daily    . Prucalopride Succinate (MOTEGRITY) 2 MG TABS Take 2 mg by mouth daily. 30 tablet 5  . silver  sulfADIAZINE (SSD) 1 % cream APPLY TO THE AFFECTED AREA(S) TWICE DAILY UNTIL HEALED    . sucralfate (CARAFATE) 1 g tablet TAKE 1 TABLET BY MOUTH FOUR TIMES DAILY WITH MEALS AND AT BEDTIME (Patient not taking: Reported on 12/01/2018) 120 tablet 2  . SUMAtriptan (IMITREX) 100 MG tablet TAKE 1 TABLET BY MOUTH AT ONSET OF HEADACHE. MAY REPEAT ONCE IN TWO HOURS. NO MORE THAN TWO TABLETS IN 24 HOURS  1  . topiramate (TOPAMAX) 100 MG tablet Take 100 mg by mouth 2 (two) times daily.      . traZODone (DESYREL) 50 MG tablet Take 150 mg by mouth at bedtime.    . triamterene-hydrochlorothiazide (DYAZIDE) 37.5-25 MG capsule Take 1 each (1 capsule total) by mouth daily. 30 capsule 11  . VENTOLIN HFA 108 (90 Base) MCG/ACT inhaler Inhale 1-2 puffs into the lungs every 6 (six) hours as needed for wheezing or shortness of breath.     . Zolpidem Tartrate (AMBIEN PO) Take 5 mg by mouth daily.      No current facility-administered medications for this visit.      Past Surgical History:  Procedure Laterality Date  . AMPUTATION  12/13/2011   Procedure: AMPUTATION DIGIT;  Surgeon: Marcheta Grammes, DPM;  Location: [facility];  Service: Orthopedics;  Laterality: Right;  amputation second toe right foot  . BACK SURGERY    . BIOPSY  12/23/2015   Procedure: BIOPSY;  Surgeon: Rogene Houston, MD;  Location: [facility];  Service: Endoscopy;;  pre pyloric patches  . BREAST BIOPSY    . CHOLECYSTECTOMY    . CYSTOSCOPY W/ URETERAL STENT PLACEMENT Left 10/09/2017   Procedure: CYSTOSCOPY WITH RETROGRADE PYELOGRAM/URETERAL STENT PLACEMENT;  Surgeon: Cleon Gustin, MD;  Location: [facility];  Service: Urology;  Laterality: Left;  . CYSTOSCOPY WITH RETROGRADE PYELOGRAM, URETEROSCOPY AND STENT PLACEMENT Left 10/23/2017   Procedure: CYSTOSCOPY WITH LEFT RETROGRADE PYELOGRAM, LEFT URETEROSCOPY AND STENT EXCHANGE;  Surgeon: Cleon Gustin, MD;  Location: [facility];  Service: Urology;  Laterality: Left;  .  ESOPHAGOGASTRODUODENOSCOPY  05/28/2011   Procedure: ESOPHAGOGASTRODUODENOSCOPY (EGD);  Surgeon: Rogene Houston, MD;  Location: [facility];  Service: Endoscopy;  Laterality: N/A;  730  . ESOPHAGOGASTRODUODENOSCOPY N/A 10/09/2013   Procedure: ESOPHAGOGASTRODUODENOSCOPY (EGD);  Surgeon: Rogene Houston, MD;  Location: [facility];  Service: Endoscopy;  Laterality: N/A;  730  . ESOPHAGOGASTRODUODENOSCOPY (EGD) WITH PROPOFOL N/A 12/23/2015   Procedure: ESOPHAGOGASTRODUODENOSCOPY (EGD) WITH PROPOFOL;  Surgeon: Rogene Houston, MD;  Location: [facility];  Service: Endoscopy;  Laterality: N/A;  .  HERNIA REPAIR    . HOLMIUM LASER APPLICATION Left [id number]   Procedure: LEFT URETEROSCOPY WITH HOLMIUM LASER LITHOTRIPSY;  Surgeon: Cleon Gustin, MD;  Location: [facility];  Service: Urology;  Laterality: Left;  Marland Kitchen MALONEY DILATION N/A 10/09/2013   Procedure: MALONEY DILATION;  Surgeon: Rogene Houston, MD;  Location: [facility];  Service: Endoscopy;  Laterality: N/A;  . NASAL SEPTUM SURGERY    . NECK SURGERY     2 yrs ago  . TOTAL ABDOMINAL HYSTERECTOMY     Left oophroectomy for a lare tumor about 21 yrs. RT ovary removed time of hysterectomy     Allergies  Allergen Reactions  . Cephalexin Hives    unknown  . Latex Rash  . Betadine [Povidone Iodine] Rash  . Cleocin [Clindamycin Hcl] Hives  . Other     Band aides - rash   . Sulfa Antibiotics     Vomiting   . Toradol [Ketorolac Tromethamine] Nausea And Vomiting    migraine      Family History  Problem Relation Age of Onset  . CAD Mother      Social History Ms. Newhouse reports that she has been smoking. She has a 17.50 pack-year smoking history. She has never used smokeless tobacco. Ms. Boerema reports no history of alcohol use.   Review of Systems CONSTITUTIONAL: No weight loss, fever, chills, weakness or fatigue.  HEENT: Eyes: No visual loss, blurred vision, double vision or yellow sclerae.No hearing loss, sneezing,  congestion, runny nose or sore throat.  SKIN: No rash or itching.  CARDIOVASCULAR: per hpi RESPIRATORY: No shortness of breath, cough or sputum.  GASTROINTESTINAL: No anorexia, nausea, vomiting or diarrhea. No abdominal pain or blood.  GENITOURINARY: No burning on urination, no polyuria NEUROLOGICAL: No headache, dizziness, syncope, paralysis, ataxia, numbness or tingling in the extremities. No change in bowel or bladder control.  MUSCULOSKELETAL: No muscle, back pain, joint pain or stiffness.  LYMPHATICS: No enlarged nodes. No history of splenectomy.  PSYCHIATRIC: No history of depression or anxiety.  ENDOCRINOLOGIC: No reports of sweating, cold or heat intolerance. No polyuria or polydipsia.  Marland Kitchen   Physical Examination Today's Vitals   12/22/18 1431  BP: 117/74  Pulse: 81  Temp: 99.1 F (37.3 C)  SpO2: 97%  Weight: 166 lb 6.4 oz (75.5 kg)  Height: 5' (1.524 m)   Body mass index is 32.5 kg/m.  Gen: resting comfortably, no acute distress HEENT: no scleral icterus, pupils equal round and reactive, no palptable cervical adenopathy,  CV:RRR, no mr/g, no jvd Resp: Clear to auscultation bilaterally GI: abdomen is soft, non-tender, non-distended, normal bowel sounds, no hepatosplenomegaly MSK: extremities are warm, no edema.  Skin: warm, no rash Neuro:  no focal deficits Psych: appropriate affect   Diagnostic Studies 10/2016 echo Study Conclusions  - Left ventricle: The cavity size was normal. Wall thickness was normal. Systolic function was normal. The estimated ejection fraction was in the range of 60% to 65%. Wall motion was normal; there were no regional wall motion abnormalities. Left ventricular diastolic function parameters were normal. - Mitral valve: There was trivial regurgitation. - Right atrium: Central venous pressure (est): 8 mm Hg. - Atrial septum: No defect or patent foramen ovale was identified. - Tricuspid valve: There was trivial regurgitation. -  Pulmonary arteries: PA peak pressure: 15 mm Hg (S). - Pericardium, extracardiac: There was no pericardial effusion.  Impressions:  - Normal LV wall thickness with LVEF 60-65% and normal diastolic function. Trivial mitral and tricuspid regurgitation.  Normal estimated PASP 15 mmHg.  09/2018 echo IMPRESSIONS    1. The left ventricle has normal systolic function with an ejection fraction of 60-65%. The cavity size was normal. Left ventricular diastolic parameters were normal. No evidence of left ventricular regional wall motion abnormalities.  2. The right ventricle has normal systolic function. The cavity was normal. There is mildly increased right ventricular wall thickness.  3. Left atrial size was mildly dilated.  4. The mitral valve is grossly normal. There is mild mitral annular calcification present.  5. The tricuspid valve is grossly normal.  6. The aortic valve is grossly normal.  7. The aortic root is normal in size and structure.  8. The inferior vena cava was dilated in size with <50% respiratory variability.      Assessment and Plan  1. Chest pain - chronic somewhat atypical chest pain - somewhat odd episode of syncope when presented for stress test recently based on review of ER notes, she reports this was due to having to wear a mask and getting so severely SOB. Benign ER workup. States she always needs oxygen if she has to wear a mask and was upset it was not provided for her during the study.  - chest pain has improved, will monitor at this time. We would certainly need to coordinate O2 if we were to plan to try to do the stress test again, at this time she refuses to have stress done.  2. LE edema - recent benign echo, swelling is improving. Continue diuretic.    F/u 4 months   [name], M.D.

## 2018-12-22 NOTE — Patient Instructions (Addendum)
Medication Instructions:   Your physician recommends that you continue on your current medications as directed. Please refer to the Current Medication list given to you today.  Call office tomorrow to go over your medications  Labwork:  NONE  Testing/Procedures:  NONE  Follow-Up:  Your physician recommends that you schedule a follow-up appointment in: 4 months. You will receive a reminder letter in the mail in about 1-2 months reminding you to call and schedule your appointment. If you don't receive this letter, please contact our office.  Any Other Special Instructions Will Be Listed Below (If Applicable).  If you need a refill on your cardiac medications before your next appointment, please call your pharmacy.

## 2018-12-22 NOTE — Telephone Encounter (Signed)
Patient requests refill on Oxycodone 15 mgs.  Qty  140  Sig: TAKE 1 TABLET BY MOUTH EVERY FIVE HOURS AS NEEDED FOR PAIN  Patient uses [facility] in [place] 

## 2018-12-23 MED ORDER — OXYCODONE HCL 15 MG PO TABS: ORAL_TABLET | ORAL | 0 refills | Status: DC

## 2018-12-24 ENCOUNTER — Ambulatory Visit (INDEPENDENT_AMBULATORY_CARE_PROVIDER_SITE_OTHER): Payer: Medicare Other | Admitting: Diagnostic Neuroimaging

## 2018-12-24 ENCOUNTER — Encounter: Payer: Self-pay | Admitting: Diagnostic Neuroimaging

## 2018-12-24 ENCOUNTER — Other Ambulatory Visit: Payer: Self-pay

## 2018-12-24 VITALS — BP 112/68 | HR 72 | Temp 97.7°F | Ht 60.0 in | Wt 166.0 lb

## 2018-12-24 DIAGNOSIS — G629 Polyneuropathy, unspecified: Secondary | ICD-10-CM

## 2018-12-24 DIAGNOSIS — R6889 Other general symptoms and signs: Secondary | ICD-10-CM

## 2018-12-24 DIAGNOSIS — Z79899 Other long term (current) drug therapy: Secondary | ICD-10-CM

## 2018-12-24 NOTE — Progress Notes (Signed)
[facility]  PATIENT: [name] DOB: 1960/12/04  REFERRING CLINICIAN: Jinny Blossom HISTORY FROM: patient  REASON FOR VISIT: new consult    HISTORICAL  CHIEF COMPLAINT:  Chief Complaint  Patient presents with  . Peripheral Neuropathy    rm 7 New Pt "to help with my nerve problems"    HISTORY OF PRESENT ILLNESS:   58 year old female here for evaluation of neuropathy.  Patient has had long history of pain, numbness, tingling, problems with neck, back, extremities since age 16 years old.  Apparently he was diagnosed with neuropathy at some point.  She is tried a variety of medications that relief.  She has had extensive work-up and seen several specialist including neurologist in the past.  Now currently getting injections and pain management from Dr. Luna Glasgow.   Patient also has scoliosis and kyphosis.  She has had 2 neck surgeries.  Has diagnosis of fibromyalgia, depression, arthritis, migraine, obesity and sleep apnea.   REVIEW OF SYSTEMS: Full 14 system review of systems performed and negative with exception of: Weight gain blurred vision insomnia memory loss confusion slurred speech anxiety depression aching muscles pain constipation swelling legs.   ALLERGIES: Allergies  Allergen Reactions  . Cephalexin Hives    unknown  . Latex Rash  . Betadine [Povidone Iodine] Rash  . Cleocin [Clindamycin Hcl] Hives  . Other     Band aides - rash   . Sulfa Antibiotics     Vomiting   . Toradol [Ketorolac Tromethamine] Nausea And Vomiting    migraine    HOME MEDICATIONS: Outpatient Medications Prior to Visit  Medication Sig Dispense Refill  . beta carotene w/minerals (OCUVITE) tablet Take 1 tablet by mouth daily.      . diclofenac sodium (VOLTAREN) 1 % GEL apply FOUR grams TO affected joints AS NEEDED FOUR TIMES DAILY 500 g 5  . Docusate Sodium 100 MG capsule Take 100 mg by mouth 2 (two) times daily.    . DULoxetine (CYMBALTA) 20 MG capsule Take 20 mg by  mouth 2 (two) times daily.    Marland Kitchen esomeprazole (NEXIUM) 40 MG capsule Take 1 capsule (40 mg total) by mouth daily before breakfast.    . furosemide (LASIX) 40 MG tablet TAKE 1 TABLET TODAY AND TOMORROW (Patient taking differently: as needed. ) 2 tablet 0  . hydrOXYzine (VISTARIL) 25 MG capsule Take 25 mg by mouth every 6 (six) hours as needed for anxiety.   5  . levothyroxine (SYNTHROID) 150 MCG tablet Take 150 mcg by mouth daily before breakfast.    . METOPROLOL TARTRATE PO Take 25 mg by mouth daily.    . Multiple Vitamins-Iron (MULTIVITAMINS WITH IRON) TABS Take 1 tablet by mouth daily.    Marland Kitchen oxyCODONE (ROXICODONE) 15 MG immediate release tablet TAKE 1 TABLET BY MOUTH EVERY FIVE HOURS AS NEEDED FOR PAIN 140 tablet 0  . polyethylene glycol (MIRALAX / GLYCOLAX) packet Take 17 g by mouth 2 (two) times daily before a meal.      . pregabalin (LYRICA) 100 MG capsule Take 100 mg by mouth 3 (three) times daily.     . Prucalopride Succinate (MOTEGRITY) 2 MG TABS Take 2 mg by mouth daily. 30 tablet 5  . sucralfate (CARAFATE) 1 g tablet Take 1 g by mouth 4 (four) times daily.    Marland Kitchen topiramate (TOPAMAX) 100 MG tablet Take 50 mg by mouth 2 (two) times daily.     . traZODone (DESYREL) 50 MG tablet Take 50 mg by mouth  at bedtime.     . triamterene-hydrochlorothiazide (DYAZIDE) 37.5-25 MG capsule Take 1 each (1 capsule total) by mouth daily. 30 capsule 11  . VENTOLIN HFA 108 (90 Base) MCG/ACT inhaler Inhale 1-2 puffs into the lungs every 6 (six) hours as needed for wheezing or shortness of breath.     . Zolpidem Tartrate (AMBIEN PO) Take 5 mg by mouth daily.     Marland Kitchen linaclotide (LINZESS) 290 MCG CAPS capsule Take 290 mcg by mouth daily before breakfast.    . silver sulfADIAZINE (SSD) 1 % cream APPLY TO THE AFFECTED AREA(S) TWICE DAILY UNTIL HEALED    . SUMAtriptan (IMITREX) 100 MG tablet TAKE 1 TABLET BY MOUTH AT ONSET OF HEADACHE. MAY REPEAT ONCE IN TWO HOURS. NO MORE THAN TWO TABLETS IN 24 HOURS  1   No  facility-administered medications prior to visit.     PAST MEDICAL HISTORY: Past Medical History:  Diagnosis Date  . Back pain   . Depression   . Fibromyalgia   . GERD (gastroesophageal reflux disease)   . Hypertension    diet control  . Hypothyroid   . IBS (irritable bowel syndrome)   . Migraines   . Obesity   . Osteoarthritis   . Sleep apnea     PAST SURGICAL HISTORY: Past Surgical History:  Procedure Laterality Date  . AMPUTATION  12/13/2011   Procedure: AMPUTATION DIGIT;  Surgeon: Marcheta Grammes, DPM;  Location: [facility];  Service: Orthopedics;  Laterality: Right;  amputation second toe right foot  . BACK SURGERY    . BIOPSY  12/23/2015   Procedure: BIOPSY;  Surgeon: Rogene Houston, MD;  Location: [facility];  Service: Endoscopy;;  pre pyloric patches  . BREAST BIOPSY    . CHOLECYSTECTOMY    . CYSTOSCOPY W/ URETERAL STENT PLACEMENT Left 10/09/2017   Procedure: CYSTOSCOPY WITH RETROGRADE PYELOGRAM/URETERAL STENT PLACEMENT;  Surgeon: Cleon Gustin, MD;  Location: [facility];  Service: Urology;  Laterality: Left;  . CYSTOSCOPY WITH RETROGRADE PYELOGRAM, URETEROSCOPY AND STENT PLACEMENT Left 10/23/2017   Procedure: CYSTOSCOPY WITH LEFT RETROGRADE PYELOGRAM, LEFT URETEROSCOPY AND STENT EXCHANGE;  Surgeon: Cleon Gustin, MD;  Location: [facility];  Service: Urology;  Laterality: Left;  . ESOPHAGOGASTRODUODENOSCOPY  05/28/2011   Procedure: ESOPHAGOGASTRODUODENOSCOPY (EGD);  Surgeon: Rogene Houston, MD;  Location: [facility];  Service: Endoscopy;  Laterality: N/A;  730  . ESOPHAGOGASTRODUODENOSCOPY N/A 10/09/2013   Procedure: ESOPHAGOGASTRODUODENOSCOPY (EGD);  Surgeon: Rogene Houston, MD;  [facility]: [facility];  Service: Endoscopy;  Laterality: N/A;  730  . ESOPHAGOGASTRODUODENOSCOPY (EGD) WITH PROPOFOL N/A 12/23/2015   Procedure: ESOPHAGOGASTRODUODENOSCOPY (EGD) WITH PROPOFOL;  Surgeon: Rogene Houston, MD;  [facility]: [facility];  Service: Endoscopy;  Laterality:  N/A;  . HERNIA REPAIR    . HOLMIUM LASER APPLICATION Left 0/97/3532   Procedure: LEFT URETEROSCOPY WITH HOLMIUM LASER LITHOTRIPSY;  Surgeon: Cleon Gustin, MD;  Location: [facility];  Service: Urology;  Laterality: Left;  Marland Kitchen MALONEY DILATION N/A 10/09/2013   Procedure: MALONEY DILATION;  Surgeon: Rogene Houston, MD;  Location: [facility];  Service: Endoscopy;  Laterality: N/A;  . NASAL SEPTUM SURGERY    . [place], 2008   2 yrs ago  . TOTAL ABDOMINAL HYSTERECTOMY     Left oophroectomy for a lare tumor about 21 yrs. RT ovary removed time of hysterectomy    FAMILY HISTORY: Family History  Problem Relation Age of Onset  . CAD Mother  SOCIAL HISTORY: Social History   Socioeconomic History  . Marital status: Single    Spouse name: Not on file  . Number of children: 0  . Years of education: 9  . Highest education level: Not on file  Occupational History    Comment: retired  Scientific laboratory technician  . Financial resource strain: Not on file  . Food insecurity    Worry: Not on file    Inability: Not on file  . Transportation needs    Medical: Not on file    Non-medical: Not on file  Tobacco Use  . Smoking status: Current Every Day Smoker    Packs/day: 0.25    Years: 35.00    Pack years: 8.75  . Smokeless tobacco: Never Used  . Tobacco comment: 1 pack a day since age18   Substance and Sexual Activity  . Alcohol use: No  . Drug use: No  . Sexual activity: Not on file  Lifestyle  . Physical activity    Days per week: Not on file    Minutes per session: Not on file  . Stress: Not on file  Relationships  . Social Herbalist on phone: Not on file    Gets together: Not on file    Attends religious service: Not on file    Active member of club or organization: Not on file    Attends meetings of clubs or organizations: Not on file    Relationship status: Not on file  . Intimate partner violence    Fear of current or ex partner: Not on file    Emotionally  abused: Not on file    Physically abused: Not on file    Forced sexual activity: Not on file  Other Topics Concern  . Not on file  Social History Narrative   Lives with cousin, sister-in-law, nephew   Caffeine- rarely     PHYSICAL EXAM  GENERAL EXAM/CONSTITUTIONAL: Vitals:  Vitals:   12/24/18 1110  BP: 112/68  Pulse: 72  Temp: 97.7 F (36.5 C)  Weight: 166 lb (75.3 kg)  Height: 5' (1.524 m)     Body mass index is 32.42 kg/m. Wt Readings from Last 3 Encounters:  12/24/18 166 lb (75.3 kg)  12/22/18 166 lb 6.4 oz (75.5 kg)  12/01/18 171 lb 9.6 oz (77.8 kg)     Patient is in no distress; well developed, nourished and groomed; neck is supple  SCOLIOSIS AND KYPHOSIS  CARDIOVASCULAR:  Examination of carotid arteries is normal; no carotid bruits  Regular rate and rhythm, no murmurs  Examination of peripheral vascular system by observation and palpation is normal  BLE EDEMA  EYES:  Ophthalmoscopic exam of optic discs and posterior segments is normal; no papilledema or hemorrhages  No exam data present  MUSCULOSKELETAL:  Gait, strength, tone, movements noted in Neurologic exam below  NEUROLOGIC: MENTAL STATUS:  No flowsheet data found.  awake, alert, oriented to person, place and time  recent and remote memory intact  normal attention and concentration  language fluent, comprehension intact, naming intact  fund of knowledge appropriate  CRANIAL NERVE:   2nd - no papilledema on fundoscopic exam  2nd, 3rd, 4th, 6th - pupils equal and reactive to light, visual fields full to confrontation, extraocular muscles intact, no nystagmus  5th - facial sensation symmetric  7th - facial strength symmetric  8th - hearing intact  9th - palate elevates symmetrically, uvula midline  11th - shoulder shrug symmetric  12th - tongue protrusion  midline  MOTOR:   normal bulk and tone  BUE 4; BLE 3-4  SENSORY:   normal and symmetric to light touch;  DECR IN HANDS AND LOWER EXT  COORDINATION:   finger-nose-finger, fine finger movements normal  REFLEXES:   deep tendon reflexes ABSENT and symmetric  GAIT/STATION:   USING WALKER     DIAGNOSTIC DATA (LABS, IMAGING, TESTING) - I reviewed patient records, labs, notes, testing and imaging myself where available.  Lab Results  Component Value Date   WBC 4.0 09/12/2018   HGB 13.0 09/12/2018   HCT 41.5 09/12/2018   MCV 92.8 09/12/2018   PLT 210 09/12/2018      Component Value Date/Time   NA 141 09/12/2018 1020   K 3.3 (L) 09/12/2018 1020   CL 110 09/12/2018 1020   CO2 22 09/12/2018 1020   GLUCOSE 87 09/12/2018 1020   BUN 8 09/12/2018 1020   CREATININE 0.66 09/12/2018 1020   CALCIUM 9.1 09/12/2018 1020   PROT 6.4 07/17/2017 1552   ALBUMIN 3.8 12/13/2015 1100   AST 17 07/17/2017 1552   ALT 10 07/17/2017 1552   ALKPHOS 56 12/13/2015 1100   BILITOT 0.3 07/17/2017 1552   GFRNONAA >60 09/12/2018 1020   GFRAA >60 09/12/2018 1020   Lab Results  Component Value Date   CHOL  10/16/2006    145        ATP III CLASSIFICATION:  <200     mg/dL   Desirable  200-239  mg/dL   Borderline High  >=240    mg/dL   High   HDL <10 (L) 10/16/2006   LDLCALC  10/16/2006    UNABLE TO CALCULATE IF TRIGLYCERIDE OVER 400 mg/dL        Total Cholesterol/HDL:CHD Risk Coronary Heart Disease Risk Table                     Men   Women  1/2 Average Risk   3.4   3.3   TRIG 408 (H) 10/16/2006   CHOLHDL NOT CALCULATED 10/16/2006   Lab Results  Component Value Date   HGBA1C (L) 11/22/2006    4.4 (NOTE)   The ADA recommends the following therapeutic goals for glycemic   control related to Hgb A1C measurement:   Goal of Therapy:   < 7.0% Hgb A1C   Action Suggested:  > 8.0% Hgb A1C   Ref:  Diabetes Care, 22, Suppl. 1, 1999   No results found for: VITAMINB12 Lab Results  Component Value Date   TSH 0.731 Test methodology is 3rd generation TSH 11/22/2006    01/14/17 MRI lumbar spine - L5-S1  disc extrusion with biforaminal stenosis; multi-level L2-3, L3-4 degenerative changes    ASSESSMENT AND PLAN  58 y.o. year old female here with longstanding numbness, tingling, burning sensation, pain in upper and lower extremities, with diffuse arthritis pain, neck pain, low back pain, obesity and fibromyalgia.  Will repeat neuropathy work-up with lab testing.  Likely represents idiopathic or hereditary neuropathy.  Dx:  1. Neuropathy   2. Other general symptoms and signs   3. Long-term current use of high risk medication other than anticoagulant     PLAN:  NEUROPATHY (since age 74 years old; likely idiopathy / hereditary neuropathy; will check for other secondary causes) - check neuropathy labs - continue lyrica and oxycodone (per pain mgmt)  Orders Placed This Encounter  Procedures  . ANA,IFA RA Diag Pnl w/rflx Tit/Patn  . Hemoglobin A1c  . Vitamin B12  .  Multiple Myeloma Panel (SPEP&IFE w/QIG)  . TSH   Return for return to PCP, pending if symptoms worsen or fail to improve.    Penni Bombard, MD 10/21/5870, 76:18 PM Certified in Neurology, Neurophysiology and Neuroimaging  [facility] [address] [place], [place] [address] [phone]

## 2018-12-26 LAB — MULTIPLE MYELOMA PANEL, SERUM
Albumin SerPl Elph-Mcnc: 3.3 g/dL (ref 2.9–4.4)
Albumin/Glob SerPl: 0.8 (ref 0.7–1.7)
Alpha 1: 0.3 g/dL (ref 0.0–0.4)
Alpha2 Glob SerPl Elph-Mcnc: 0.8 g/dL (ref 0.4–1.0)
B-Globulin SerPl Elph-Mcnc: 0.9 g/dL (ref 0.7–1.3)
Gamma Glob SerPl Elph-Mcnc: 2.7 g/dL — ABNORMAL HIGH (ref 0.4–1.8)
Globulin, Total: 4.7 g/dL — ABNORMAL HIGH (ref 2.2–3.9)
IgA/Immunoglobulin A, Serum: 161 mg/dL (ref 87–352)
IgG (Immunoglobin G), Serum: 3040 mg/dL — ABNORMAL HIGH (ref 586–1602)
IgM (Immunoglobulin M), Srm: 268 mg/dL — ABNORMAL HIGH (ref 26–217)
M Protein SerPl Elph-Mcnc: 1.8 g/dL — ABNORMAL HIGH
Total Protein: 8 g/dL (ref 6.0–8.5)

## 2018-12-26 LAB — TSH: TSH: 0.811 u[IU]/mL (ref 0.450–4.500)

## 2018-12-26 LAB — ANA,IFA RA DIAG PNL W/RFLX TIT/PATN
ANA Titer 1: NEGATIVE
Cyclic Citrullin Peptide Ab: 5 units (ref 0–19)
Rheumatoid fact SerPl-aCnc: <10 IU/mL (ref 0.0–13.9)

## 2018-12-26 LAB — HEMOGLOBIN A1C
Est. average glucose Bld gHb Est-mCnc: 105 mg/dL
Hgb A1c MFr Bld: 5.3 % (ref 4.8–5.6)

## 2018-12-26 LAB — VITAMIN B12: Vitamin B-12: 1077 pg/mL (ref 232–1245)

## 2018-12-29 ENCOUNTER — Telehealth: Payer: Self-pay | Admitting: *Deleted

## 2018-12-29 DIAGNOSIS — R899 Unspecified abnormal finding in specimens from other organs, systems and tissues: Secondary | ICD-10-CM

## 2018-12-29 DIAGNOSIS — Z79899 Other long term (current) drug therapy: Secondary | ICD-10-CM

## 2018-12-29 DIAGNOSIS — G629 Polyneuropathy, unspecified: Secondary | ICD-10-CM

## 2018-12-29 NOTE — Telephone Encounter (Signed)
LVM requesting call back for lab results.  

## 2018-12-29 NOTE — Addendum Note (Signed)
Addended by: Minna Antis on: 12/29/2018 04:57 PM   Modules accepted: Orders

## 2018-12-29 NOTE — Telephone Encounter (Signed)
Spoke with patient and informed her labs are okay, except M-protein detected in sample. Sometimes this is associated with neuropathies. Dr Leta Baptist recommends further workup by hematology specialist. She stated that she agrees to referral and would like to be seen as close to home in [place] as possible. Patient verbalized understanding, appreciation.

## 2018-12-31 ENCOUNTER — Ambulatory Visit: Payer: Medicare Other | Admitting: Orthopaedic Surgery

## 2019-01-09 ENCOUNTER — Encounter (HOSPITAL_COMMUNITY): Payer: Self-pay

## 2019-01-12 ENCOUNTER — Inpatient Hospital Stay (HOSPITAL_COMMUNITY): Payer: Medicare Other

## 2019-01-12 ENCOUNTER — Inpatient Hospital Stay (HOSPITAL_COMMUNITY): Payer: Medicare Other | Attending: Hematology | Admitting: Hematology

## 2019-01-12 ENCOUNTER — Encounter (HOSPITAL_COMMUNITY): Payer: Self-pay | Admitting: Hematology

## 2019-01-12 ENCOUNTER — Other Ambulatory Visit: Payer: Self-pay

## 2019-01-12 DIAGNOSIS — G629 Polyneuropathy, unspecified: Secondary | ICD-10-CM | POA: Diagnosis not present

## 2019-01-12 DIAGNOSIS — M810 Age-related osteoporosis without current pathological fracture: Secondary | ICD-10-CM | POA: Diagnosis not present

## 2019-01-12 DIAGNOSIS — D472 Monoclonal gammopathy: Secondary | ICD-10-CM

## 2019-01-12 LAB — CBC WITH DIFFERENTIAL/PLATELET
Abs Immature Granulocytes: 0.01 10*3/uL (ref 0.00–0.07)
Basophils Absolute: 0 10*3/uL (ref 0.0–0.1)
Basophils Relative: 1 %
Eosinophils Absolute: 0.4 10*3/uL (ref 0.0–0.5)
Eosinophils Relative: 7 %
HCT: 41.6 % (ref 36.0–46.0)
Hemoglobin: 13 g/dL (ref 12.0–15.0)
Immature Granulocytes: 0 %
Lymphocytes Relative: 38 %
Lymphs Abs: 1.9 10*3/uL (ref 0.7–4.0)
MCH: 28.9 pg (ref 26.0–34.0)
MCHC: 31.3 g/dL (ref 30.0–36.0)
MCV: 92.4 fL (ref 80.0–100.0)
Monocytes Absolute: 0.4 10*3/uL (ref 0.1–1.0)
Monocytes Relative: 7 %
Neutro Abs: 2.4 10*3/uL (ref 1.7–7.7)
Neutrophils Relative %: 47 %
Platelets: 156 10*3/uL (ref 150–400)
RBC: 4.5 MIL/uL (ref 3.87–5.11)
RDW: 14 % (ref 11.5–15.5)
WBC: 5.1 10*3/uL (ref 4.0–10.5)
nRBC: 0 % (ref 0.0–0.2)

## 2019-01-12 LAB — COMPREHENSIVE METABOLIC PANEL
ALT: 19 U/L (ref 0–44)
AST: 20 U/L (ref 15–41)
Albumin: 3.1 g/dL — ABNORMAL LOW (ref 3.5–5.0)
Alkaline Phosphatase: 57 U/L (ref 38–126)
Anion gap: 8 (ref 5–15)
BUN: 16 mg/dL (ref 6–20)
CO2: 25 mmol/L (ref 22–32)
Calcium: 8.3 mg/dL — ABNORMAL LOW (ref 8.9–10.3)
Chloride: 103 mmol/L (ref 98–111)
Creatinine, Ser: 0.86 mg/dL (ref 0.44–1.00)
GFR calc Af Amer: >60 mL/min (ref >60)
GFR calc non Af Amer: >60 mL/min (ref >60)
Glucose, Bld: 92 mg/dL (ref 70–99)
Potassium: 3.1 mmol/L — ABNORMAL LOW (ref 3.5–5.1)
Sodium: 136 mmol/L (ref 135–145)
Total Bilirubin: 0.2 mg/dL — ABNORMAL LOW (ref 0.3–1.2)
Total Protein: 7.8 g/dL (ref 6.5–8.1)

## 2019-01-12 LAB — LACTATE DEHYDROGENASE: LDH: 142 U/L (ref 98–192)

## 2019-01-12 LAB — URIC ACID: Uric Acid, Serum: 4.1 mg/dL (ref 2.5–7.1)

## 2019-01-12 NOTE — Assessment & Plan Note (Addendum)
1.  IgG kappa monoclonal gammopathy: - Evaluation for peripheral neuropathy with labs on 12/24/2018 showing 1.8 g/dL of IgG kappa monoclonal gammopathy. - We will check CBC, CMP, serum free light chains, LDH, beta-2 microglobulin, serum viscosity and uric acid. -We will send 24-hour urine for total protein, UPEP, urine immunofixation. -She will also need bone marrow aspiration biopsy.  We will also order whole-body PET CT scan. 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 -We will see her back after the above tests.  2.  Osteoporosis: -DEXA scan on 06/10/2017 shows T score of -4.6. -She reportedly received 1 dose of what appears to be Reclast injection last year with Dr. Carloyn Manner. -She has not received any further treatment for it.  We will talk about it next visit.  3.  Peripheral neuropathy: -She reportedly developed neuropathy in her 76s.  She currently has burning sensation in the feet, extending all the way up to about the knee.  She also has burning sensation in the upper extremities up to the level of shoulders. -She is on Lyrica 100 mg 3 times a day.  4.  Family history: - Mother had kidney cancer.  Sister had "female cancer" similar to the patient.  Brother had liver cancer.  Few maternal and paternal aunts had breast cancers.

## 2019-01-12 NOTE — Progress Notes (Signed)
CONSULT NOTE  Patient Care Team: Wyatt Haste, NP as PCP - General Branch, Alphonse Guild, MD as PCP - Cardiology (Cardiology)  CHIEF COMPLAINTS/PURPOSE OF CONSULTATION:  Monoclonal gammopathy.  HISTORY OF PRESENTING ILLNESS:  [name] 58 y.o. female is seen at the request of Dr. Leta Baptist for further work-up and management of monoclonal gammopathy.  She reportedly had peripheral neuropathy since her 31s.  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  She reported worsening of her neuropathy symptoms lately with burning sensation in the feet, extending up to about the knee and upper extremity neuropathy.  She is currently taking Lyrica 100 mg 3 times a day.  Denies any fevers, night sweats or significant weight loss in the last 6 months.  She does report pain all over her body.  She reportedly had a hysterectomy in her 40s for cancer.  Is not clear whether it was uterine cancer or ovarian cancer.  She lives at home with her older sister who is bedridden.  She drove herself to the clinic today.  The patient walks with help of walker and cane.  She does not have any children.  She used to work in a [employer].  She is current active smoker, smokes 1 pack/day for 35 years.  Family history significant for mother with kidney cancer.  Sister had ovarian cancer.  Brother had liver cancer.  Maternal and paternal aunts had breast cancer.  MEDICAL HISTORY:  Past Medical History:  Diagnosis Date  . Back pain   . Depression   . Fibromyalgia   . GERD (gastroesophageal reflux disease)   . Hypertension    diet control  . Hypothyroid   . IBS (irritable bowel syndrome)   . Migraines   . Obesity   . Osteoarthritis   . Sleep apnea     SURGICAL HISTORY: Past Surgical History:  Procedure Laterality Date  . AMPUTATION  12/13/2011   Procedure: AMPUTATION DIGIT;  Surgeon: Marcheta Grammes, DPM;  Location: [facility];  Service: Orthopedics;  Laterality: Right;   amputation second toe right foot  . BACK SURGERY    . BIOPSY  12/23/2015   Procedure: BIOPSY;  Surgeon: Rogene Houston, MD;  Location: [facility];  Service: Endoscopy;;  pre pyloric patches  . BREAST BIOPSY    . CHOLECYSTECTOMY    . CYSTOSCOPY W/ URETERAL STENT PLACEMENT Left 10/09/2017   Procedure: CYSTOSCOPY WITH RETROGRADE PYELOGRAM/URETERAL STENT PLACEMENT;  Surgeon: Cleon Gustin, MD;  Location: [facility];  Service: Urology;  Laterality: Left;  . CYSTOSCOPY WITH RETROGRADE PYELOGRAM, URETEROSCOPY AND STENT PLACEMENT Left 10/23/2017   Procedure: CYSTOSCOPY WITH LEFT RETROGRADE PYELOGRAM, LEFT URETEROSCOPY AND STENT EXCHANGE;  Surgeon: Cleon Gustin, MD;  Location: [facility];  Service: Urology;  Laterality: Left;  . ESOPHAGOGASTRODUODENOSCOPY  05/28/2011   Procedure: ESOPHAGOGASTRODUODENOSCOPY (EGD);  Surgeon: Rogene Houston, MD;  [facility]: [facility];  Service: Endoscopy;  Laterality: N/A;  730  . ESOPHAGOGASTRODUODENOSCOPY N/A 10/09/2013   Procedure: ESOPHAGOGASTRODUODENOSCOPY (EGD);  Surgeon: Rogene Houston, MD;  [facility];  Service: Endoscopy;  Laterality: N/A;  730  . ESOPHAGOGASTRODUODENOSCOPY (EGD) WITH PROPOFOL N/A 12/23/2015   Procedure: ESOPHAGOGASTRODUODENOSCOPY (EGD) WITH PROPOFOL;  Surgeon: Rogene Houston, MD;  [facility];  Service: Endoscopy;  Laterality: N/A;  . HERNIA REPAIR    . HOLMIUM LASER APPLICATION Left 2/84/1324   Procedure: LEFT URETEROSCOPY WITH HOLMIUM LASER LITHOTRIPSY;  Surgeon: Cleon Gustin, MD;  Location: [facility];  Service: Urology;  Laterality: Left;  Marland Kitchen MALONEY DILATION N/A 10/09/2013   Procedure: MALONEY DILATION;  Surgeon: Rogene Houston, MD;  [facility]: [facility];  Service: Endoscopy;  Laterality: N/A;  . NASAL SEPTUM SURGERY    . [place], 2008   2 yrs ago  . TOTAL ABDOMINAL HYSTERECTOMY     Left oophroectomy for a lare tumor about 21 yrs. RT ovary removed time of hysterectomy    SOCIAL  HISTORY: Social History   Socioeconomic History  . Marital status: Single    Spouse name: Not on file  . Number of children: 0  . Years of education: 9  . Highest education level: Not on file  Occupational History  . Occupation: Unemployed  Social Needs  . Financial resource strain: Not on file  . Food insecurity    Worry: Not on file    Inability: Not on file  . Transportation needs    Medical: Not on file    Non-medical: Not on file  Tobacco Use  . Smoking status: Current Every Day Smoker    Packs/day: 0.25    Years: 35.00    Pack years: 8.75    Types: Cigarettes  . Smokeless tobacco: Never Used  . Tobacco comment: 1 pack a day since age18   Substance and Sexual Activity  . Alcohol use: No  . Drug use: No  . Sexual activity: Not on file  Lifestyle  . Physical activity    Days per week: Not on file    Minutes per session: Not on file  . Stress: Not on file  Relationships  . Social Herbalist on phone: Not on file    Gets together: Not on file    Attends religious service: Not on file    Active member of club or organization: Not on file    Attends meetings of clubs or organizations: Not on file    Relationship status: Not on file  . Intimate partner violence    Fear of current or ex partner: Not on file    Emotionally abused: Not on file    Physically abused: Not on file    Forced sexual activity: Not on file  Other Topics Concern  . Not on file  Social History Narrative   Lives with cousin, sister-in-law, nephew   Caffeine- rarely    FAMILY HISTORY: Family History  Problem Relation Age of Onset  . CAD Mother   . Cancer Mother   . Obesity Brother   . Cancer Sister   . Liver disease Brother   . Hypertension Brother     ALLERGIES:  is allergic to cephalexin; latex; betadine [povidone iodine]; cleocin [clindamycin hcl]; other; sulfa antibiotics; and toradol [ketorolac tromethamine].  MEDICATIONS:  Current Outpatient Medications   Medication Sig Dispense Refill  . beta carotene w/minerals (OCUVITE) tablet Take 1 tablet by mouth daily.      . diclofenac sodium (VOLTAREN) 1 % GEL apply FOUR grams TO affected joints AS NEEDED FOUR TIMES DAILY (Patient not taking: Reported on 01/09/2019) 500 g 5  . Docusate Sodium 100 MG capsule Take 100 mg by mouth 2 (two) times daily.    . DULoxetine (CYMBALTA) 20 MG capsule Take 20 mg by mouth 2 (two) times daily.    Marland Kitchen esomeprazole (NEXIUM) 40 MG capsule Take 1 capsule (40 mg total) by mouth daily before breakfast.    . furosemide (LASIX) 40 MG tablet TAKE 1 TABLET  TODAY AND TOMORROW (Patient not taking: Reported on 01/09/2019) 2 tablet 0  . hydrOXYzine (VISTARIL) 25 MG capsule Take 25 mg by mouth every 6 (six) hours as needed for anxiety.   5  . levothyroxine (SYNTHROID) 150 MCG tablet Take 150 mcg by mouth daily before breakfast.    . linaclotide (LINZESS) 290 MCG CAPS capsule Take 290 mcg by mouth daily before breakfast.    . METOPROLOL TARTRATE PO Take 25 mg by mouth daily.    . Multiple Vitamins-Iron (MULTIVITAMINS WITH IRON) TABS Take 1 tablet by mouth daily.    Marland Kitchen oxyCODONE (ROXICODONE) 15 MG immediate release tablet TAKE 1 TABLET BY MOUTH EVERY FIVE HOURS AS NEEDED FOR PAIN 140 tablet 0  . polyethylene glycol (MIRALAX / GLYCOLAX) packet Take 17 g by mouth 2 (two) times daily before a meal.      . pregabalin (LYRICA) 100 MG capsule Take 100 mg by mouth 3 (three) times daily.     . Prucalopride Succinate (MOTEGRITY) 2 MG TABS Take 2 mg by mouth daily. 30 tablet 5  . silver sulfADIAZINE (SSD) 1 % cream APPLY TO THE AFFECTED AREA(S) TWICE DAILY UNTIL HEALED    . sucralfate (CARAFATE) 1 g tablet Take 1 g by mouth 4 (four) times daily.    . SUMAtriptan (IMITREX) 100 MG tablet TAKE 1 TABLET BY MOUTH AT ONSET OF HEADACHE. MAY REPEAT ONCE IN TWO HOURS. NO MORE THAN TWO TABLETS IN 24 HOURS  1  . topiramate (TOPAMAX) 100 MG tablet Take 50 mg by mouth 2 (two) times daily.     . traZODone  (DESYREL) 50 MG tablet Take 50 mg by mouth at bedtime.     . triamterene-hydrochlorothiazide (DYAZIDE) 37.5-25 MG capsule Take 1 each (1 capsule total) by mouth daily. 30 capsule 11  . VENTOLIN HFA 108 (90 Base) MCG/ACT inhaler Inhale 1-2 puffs into the lungs every 6 (six) hours as needed for wheezing or shortness of breath.     . Zolpidem Tartrate (AMBIEN PO) Take 5 mg by mouth daily.      No current facility-administered medications for this visit.     REVIEW OF SYSTEMS:   Constitutional: Denies fevers, chills or abnormal night sweats Eyes: Denies blurriness of vision, double vision or watery eyes Ears, nose, mouth, throat, and face: Denies mucositis or sore throat Respiratory: Denies cough, dyspnea or wheezes Cardiovascular: Denies palpitation, chest discomfort or lower extremity swelling Gastrointestinal:  Denies  heartburn or change in bowel habits.  Positive for constipation.  Positive for nausea. Skin: Denies abnormal skin rashes Lymphatics: Denies new lymphadenopathy or easy bruising Neurological: Positive for neuropathy pains in the extremities. Behavioral/Psych: Mood is stable, no new changes  All other systems were reviewed with the patient and are negative.  PHYSICAL EXAMINATION: ECOG PERFORMANCE STATUS: 2 - Symptomatic, <50% confined to bed  Vitals:   01/12/19 1334  BP: 100/60  Pulse: 84  Resp: 18  Temp: (!) 96.8 F (36 C)  SpO2: 98%   Filed Weights   01/12/19 1334  Weight: 166 lb (75.3 kg)    GENERAL:alert, no distress and comfortable SKIN: skin color, texture, turgor are normal, no rashes or significant lesions EYES: normal, conjunctiva are pink and non-injected, sclera clear OROPHARYNX:no exudate, no erythema and lips, buccal mucosa, and tongue normal  NECK: supple, thyroid normal size, non-tender, without nodularity LYMPH:  no palpable lymphadenopathy in the cervical, axillary or inguinal LUNGS: clear to auscultation and percussion with normal breathing  effort HEART: regular rate & rhythm  and no murmurs.  1+ edema bilaterally. ABDOMEN:abdomen soft, non-tender and normal bowel sounds Musculoskeletal:no cyanosis of digits and no clubbing  PSYCH: alert & oriented x 3 with fluent speech NEURO: no focal motor/sensory deficits.  Positive for kyphosis of the thoracic spine.  LABORATORY DATA:  I have reviewed the data as listed Recent Results (from the past 2160 hour(s))  ANA,IFA RA Diag Pnl w/rflx Tit/Patn     Status: None   Collection Time: 12/24/18 12:22 PM  Result Value Ref Range   ANA Titer 1 Negative     Comment:                                      Negative   <1:80                                      Borderline  1:80                                      Positive   >1:80    Rhuematoid fact SerPl-aCnc <10.0 0.0 - 13.1 IU/mL   Cyclic Citrullin Peptide Ab 5 0 - 19 units    Comment:                           Negative               <20                           Weak positive      20 - 39                           Moderate positive  40 - 59                           Strong positive        >59   Hemoglobin A1c     Status: None   Collection Time: 12/24/18 12:22 PM  Result Value Ref Range   Hgb A1c MFr Bld 5.3 4.8 - 5.6 %    Comment:          Prediabetes: 5.7 - 6.4          Diabetes: >6.4          Glycemic control for adults with diabetes: <7.0    Est. average glucose Bld gHb Est-mCnc 105 mg/dL  Vitamin B12     Status: None   Collection Time: 12/24/18 12:22 PM  Result Value Ref Range   Vitamin B-12 1,077 232 - 1,245 pg/mL  Multiple Myeloma Panel (SPEP&IFE w/QIG)     Status: Abnormal   Collection Time: 12/24/18 12:22 PM  Result Value Ref Range   IgG (Immunoglobin G), Serum 3,040 (H) 586 - 1,602 mg/dL   IgA/Immunoglobulin A, Serum 161 87 - 352 mg/dL   IgM (Immunoglobulin M), Srm 268 (H) 26 - 217 mg/dL   Total Protein 8.0 6.0 - 8.5 g/dL   Albumin SerPl Elph-Mcnc 3.3 2.9 - 4.4 g/dL   Alpha 1 0.3 0.0 - 0.4 g/dL   Alpha2 Glob  SerPl  Elph-Mcnc 0.8 0.4 - 1.0 g/dL   B-Globulin SerPl Elph-Mcnc 0.9 0.7 - 1.3 g/dL   Gamma Glob SerPl Elph-Mcnc 2.7 (H) 0.4 - 1.8 g/dL   M Protein SerPl Elph-Mcnc 1.8 (H) Not Observed g/dL   Globulin, Total 4.7 (H) 2.2 - 3.9 g/dL   Albumin/Glob SerPl 0.8 0.7 - 1.7   IFE 1 Comment     Comment: Immunofixation shows IgG monoclonal protein with kappa light chain specificity. Please note that samples from patients receiving DARZALEX(R) (daratumumab) treatment can appear as an "IgG kappa" and mask a complete response. If this patient is receiving DARA, this IFE assay interference can be removed by ordering test number [id number]-"Immunofixation, Daratumumab-Specific, Serum" and submitting a new sample for testing or by calling the lab to add this test to the current sample. Polyclonal increase detected in one or more immunoglobulins.    Please Note Comment     Comment: Protein electrophoresis scan will follow via computer, mail, or courier delivery.   TSH     Status: None   Collection Time: 12/24/18 12:22 PM  Result Value Ref Range   TSH 0.811 0.450 - 4.500 uIU/mL    RADIOGRAPHIC STUDIES: I have personally reviewed the radiological images as listed and agreed with the findings in the report.  ASSESSMENT & PLAN:  IgG monoclonal gammopathy 1.  IgG kappa monoclonal gammopathy: - Evaluation for peripheral neuropathy with labs on 12/24/2018 showing 1.8 g/dL of IgG kappa monoclonal gammopathy. - We will check CBC, CMP, serum free light chains, LDH, beta-2 microglobulin, serum viscosity and uric acid. -We will send 24-hour urine for total protein, UPEP, urine immunofixation. -She will also need bone marrow aspiration biopsy.  We will also order whole-body PET CT scan. 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 -We will see her back after the above tests.  2.  Osteoporosis: -DEXA scan on 06/10/2017 shows T score of  -4.6. -She reportedly received 1 dose of what appears to be Reclast injection last year with Dr. Carloyn Manner. -She has not received any further treatment for it.  We will talk about it next visit.  3.  Peripheral neuropathy: -She reportedly developed neuropathy in her 40s.  She currently has burning sensation in the feet, extending all the way up to about the knee.  She also has burning sensation in the upper extremities up to the level of shoulders. -She is on Lyrica 100 mg 3 times a day.  4.  Family history: - Mother had kidney cancer.  Sister had "female cancer" similar to the patient.  Brother had liver cancer.  Few maternal and paternal aunts had breast cancers.    Total time spent 60 minutes with more than 50% of the time spent face-to-face discussing further work-up, counseling and coordination of care. All questions were answered. The patient knows to call the clinic with any problems, questions or concerns.      Derek Jack, MD 01/12/19 2:05 PM

## 2019-01-12 NOTE — Patient Instructions (Signed)
[facility] at [facility] Discharge Instructions  You were seen today by Dr. Katragadda. He went over your history, family history and how you've been feeling lately. He will have blood drawn today. He will schedule you for a PET scan as well as a bone marrow biopsy.  He will see you back in 3 weeks for follow up.   Thank you for choosing [facility] at [facility] to provide your oncology and hematology care.  To afford each patient quality time with our provider, please arrive at least 15 minutes before your scheduled appointment time.   If you have a lab appointment with the Cancer Center please come in thru the  Main Entrance and check in at the main information desk  You need to re-schedule your appointment should you arrive 10 or more minutes late.  We strive to give you quality time with our providers, and arriving late affects you and other patients whose appointments are after yours.  Also, if you no show three or more times for appointments you may be dismissed from the clinic at the providers discretion.     Again, thank you for choosing [facility].  Our hope is that these requests will decrease the amount of time that you wait before being seen by our physicians.       _____________________________________________________________  Should you have questions after your visit to [facility], please contact our office at [phone] between the hours of 8:00 a.m. and 4:30 p.m.  Voicemails left after 4:00 p.m. will not be returned until the following business day.  For prescription refill requests, have your pharmacy contact our office and allow 72 hours.    Cancer Center Support Programs:   > Cancer Support Group  2nd Tuesday of the month 1pm-2pm, Journey Room    

## 2019-01-13 LAB — BETA 2 MICROGLOBULIN, SERUM: Beta-2 Microglobulin: 3.1 mg/L — ABNORMAL HIGH (ref 0.6–2.4)

## 2019-01-13 LAB — KAPPA/LAMBDA LIGHT CHAINS
Kappa free light chain: 65 mg/L — ABNORMAL HIGH (ref 3.3–19.4)
Kappa, lambda light chain ratio: 1.74 — ABNORMAL HIGH (ref 0.26–1.65)
Lambda free light chains: 37.3 mg/L — ABNORMAL HIGH (ref 5.7–26.3)

## 2019-01-14 ENCOUNTER — Encounter: Payer: Self-pay | Admitting: Orthopaedic Surgery

## 2019-01-14 ENCOUNTER — Ambulatory Visit (INDEPENDENT_AMBULATORY_CARE_PROVIDER_SITE_OTHER): Payer: Medicare Other | Admitting: Orthopaedic Surgery

## 2019-01-14 ENCOUNTER — Other Ambulatory Visit: Payer: Self-pay

## 2019-01-14 DIAGNOSIS — M25512 Pain in left shoulder: Secondary | ICD-10-CM | POA: Diagnosis not present

## 2019-01-14 DIAGNOSIS — G8929 Other chronic pain: Secondary | ICD-10-CM | POA: Diagnosis not present

## 2019-01-14 DIAGNOSIS — F17210 Nicotine dependence, cigarettes, uncomplicated: Secondary | ICD-10-CM | POA: Diagnosis not present

## 2019-01-14 DIAGNOSIS — G894 Chronic pain syndrome: Secondary | ICD-10-CM | POA: Diagnosis not present

## 2019-01-14 LAB — VISCOSITY, SERUM: Viscosity, Serum: 1.8 rel.saline (ref 1.4–2.1)

## 2019-01-14 NOTE — Progress Notes (Signed)
PROCEDURE NOTE:  The patient request injection, verbal consent was obtained.  The left shoulder was prepped appropriately after time out was performed.   Sterile technique was observed and injection of 1 cc of Depo-Medrol 40 mg with several cc's of plain xylocaine. Anesthesia was provided by ethyl chloride and a 20-gauge needle was used to inject the shoulder area. A posterior approach was used.  The injection was tolerated well.  A band aid dressing was applied.  The patient was advised to apply ice later today and tomorrow to the injection sight as needed.  I will see in one month.  I will arrange for PT in home for the right hip and lower back pain.  Electronically Signed Sanjuana Kava, MD 10/14/202010:12 AM

## 2019-01-14 NOTE — Patient Instructions (Signed)

## 2019-01-21 ENCOUNTER — Telehealth: Payer: Self-pay | Admitting: Orthopaedic Surgery

## 2019-01-21 MED ORDER — OXYCODONE HCL 15 MG PO TABS: ORAL_TABLET | ORAL | 0 refills | Status: DC

## 2019-01-21 NOTE — Telephone Encounter (Signed)
Patient requests refill on Oxycodone 15 mgs.   Qty  140 ° °Sig: TAKE 1 TABLET BY MOUTH EVERY FIVE HOURS AS NEEDED FOR PAIN  °      ° °Patient states she uses [facility] in [place] °

## 2019-01-21 NOTE — Addendum Note (Signed)
Addended by: Willette Pa on: 01/21/2019 07:39 PM   Modules accepted: Orders

## 2019-01-26 ENCOUNTER — Ambulatory Visit (HOSPITAL_COMMUNITY)
Admission: RE | Admit: 2019-01-26 | Discharge: 2019-01-26 | Disposition: A | Discharge status: Home / Self Care | Payer: Medicare Other | Source: Ambulatory Visit | Attending: Hematology | Admitting: Hematology

## 2019-01-26 ENCOUNTER — Other Ambulatory Visit: Payer: Self-pay

## 2019-01-26 DIAGNOSIS — D472 Monoclonal gammopathy: Secondary | ICD-10-CM | POA: Insufficient documentation

## 2019-01-26 MED ORDER — FLUDEOXYGLUCOSE F - 18 (FDG) INJECTION
9.7100 | Freq: Once | INTRAVENOUS | Status: AC | PRN
  Administered 2019-01-26: 9.71 via INTRAVENOUS

## 2019-02-10 ENCOUNTER — Ambulatory Visit (HOSPITAL_COMMUNITY): Payer: Medicare Other | Admitting: Hematology

## 2019-02-10 ENCOUNTER — Encounter (HOSPITAL_COMMUNITY): Payer: Medicare Other | Admitting: Hematology

## 2019-02-11 ENCOUNTER — Ambulatory Visit: Payer: Medicare Other | Admitting: Orthopaedic Surgery

## 2019-02-11 ENCOUNTER — Ambulatory Visit (INDEPENDENT_AMBULATORY_CARE_PROVIDER_SITE_OTHER): Payer: Medicare Other | Admitting: Orthopaedic Surgery

## 2019-02-11 ENCOUNTER — Other Ambulatory Visit: Payer: Self-pay

## 2019-02-11 ENCOUNTER — Encounter: Payer: Self-pay | Admitting: Orthopaedic Surgery

## 2019-02-11 DIAGNOSIS — M25512 Pain in left shoulder: Secondary | ICD-10-CM | POA: Diagnosis not present

## 2019-02-11 DIAGNOSIS — G894 Chronic pain syndrome: Secondary | ICD-10-CM | POA: Diagnosis not present

## 2019-02-11 DIAGNOSIS — G8929 Other chronic pain: Secondary | ICD-10-CM | POA: Diagnosis not present

## 2019-02-11 DIAGNOSIS — F17210 Nicotine dependence, cigarettes, uncomplicated: Secondary | ICD-10-CM

## 2019-02-11 NOTE — Progress Notes (Signed)
PROCEDURE NOTE:  The patient request injection, verbal consent was obtained.  The left shoulder was prepped appropriately after time out was performed.   Sterile technique was observed and injection of 1 cc of Depo-Medrol 40 mg with several cc's of plain xylocaine. Anesthesia was provided by ethyl chloride and a 20-gauge needle was used to inject the shoulder area. A posterior approach was used.  The injection was tolerated well.  A band aid dressing was applied.  The patient was advised to apply ice later today and tomorrow to the injection sight as needed.  Electronically Signed Sanjuana Kava, MD 11/11/202010:51 AM

## 2019-02-23 ENCOUNTER — Telehealth: Payer: Self-pay

## 2019-02-23 MED ORDER — OXYCODONE HCL 15 MG PO TABS: ORAL_TABLET | ORAL | 0 refills | Status: DC

## 2019-02-23 NOTE — Telephone Encounter (Signed)
Oxycodone 15 mg Qty 140 Tablets  PATIENT USES [facility] 

## 2019-02-24 ENCOUNTER — Other Ambulatory Visit: Payer: Self-pay

## 2019-02-24 ENCOUNTER — Encounter (HOSPITAL_COMMUNITY): Payer: Self-pay | Admitting: Hematology

## 2019-02-24 ENCOUNTER — Inpatient Hospital Stay (HOSPITAL_COMMUNITY): Payer: Medicare Other | Attending: Hematology | Admitting: Hematology

## 2019-02-24 DIAGNOSIS — D472 Monoclonal gammopathy: Secondary | ICD-10-CM | POA: Diagnosis present

## 2019-02-24 NOTE — Progress Notes (Signed)
Virtual Visit via Telephone Note  I connected with [name] on 02/24/19 at  3:05 PM EST by telephone and verified that I am speaking with the correct person using two identifiers.   I discussed the limitations, risks, security and privacy concerns of performing an evaluation and management service by telephone and the availability of in person appointments. I also discussed with the patient that there may be a patient responsible charge related to this service. The patient expressed understanding and agreed to proceed.   History of Present Illness: She is being seen in the clinic for work-up of monoclonal gammopathy.  She also has a history of osteoporosis and peripheral neuropathy.   Observations/Objective: She reports diffuse body pains.  Appetite is 50%.  Energy levels are low.  Pain in the legs from peripheral neuropathy is also stable.  Occasional nausea and constipation present.  Denies any fevers or chills.  Assessment and Plan:  1.  IgG kappa monoclonal gammopathy: -Evaluation for peripheral neuropathy showed 1.8 g of IgG kappa monoclonal protein. -PET scan on 01/26/2019 did not show any evidence of hypermetabolic soft tissue or osseous multiple myeloma. -Free light chain ratio is 1.74 with kappa light chains of 65.  Beta-2 microglobulin was 3.1.  LDH was normal.  Serum viscosity was normal.  Creatinine was normal.  Calcium was also normal. -We have scheduled her for bone marrow biopsy.  Unfortunately she cannot go to [place].  We will try to accommodate in our cancer center.  She could not make it to the appointment on that day.  As her PET scan did not show any lytic lesions, I think we can hold off on bone marrow biopsy at this time until we see any significant changes in her labs.  She has difficulty lying on her abdomen or on her side. -I have recommended follow-up in 3 months with repeat myeloma labs.  2.  Osteoporosis: -DEXA scan on 06/10/2017 shows T score -4.6.  She  reportedly received Reclast injection 1 dose and lost to follow-up. -We will talk to her about it at next visit.  3.  Peripheral neuropathy: -She reportedly developed neuropathy in her 54s and has burning sensation in the feet extending all the way up to the knee.  Also has burning sensation in the upper extremities to the level of her shoulders.  She is on Lyrica 100 mg 3 times a day.   Follow Up Instructions:    I discussed the assessment and treatment plan with the patient. The patient was provided an opportunity to ask questions and all were answered. The patient agreed with the plan and demonstrated an understanding of the instructions.   The patient was advised to call back or seek an in-person evaluation if the symptoms worsen or if the condition fails to improve as anticipated.  I provided 15 minutes of non-face-to-face time during this encounter.   Derek Jack, MD

## 2019-02-25 ENCOUNTER — Ambulatory Visit: Payer: Medicare Other | Admitting: Orthopaedic Surgery

## 2019-03-11 ENCOUNTER — Encounter: Payer: Self-pay | Admitting: Orthopaedic Surgery

## 2019-03-11 ENCOUNTER — Ambulatory Visit (INDEPENDENT_AMBULATORY_CARE_PROVIDER_SITE_OTHER): Payer: Medicare Other | Admitting: Orthopaedic Surgery

## 2019-03-11 ENCOUNTER — Other Ambulatory Visit: Payer: Self-pay

## 2019-03-11 DIAGNOSIS — G8929 Other chronic pain: Secondary | ICD-10-CM | POA: Diagnosis not present

## 2019-03-11 DIAGNOSIS — F17210 Nicotine dependence, cigarettes, uncomplicated: Secondary | ICD-10-CM

## 2019-03-11 DIAGNOSIS — M25512 Pain in left shoulder: Secondary | ICD-10-CM

## 2019-03-11 DIAGNOSIS — G894 Chronic pain syndrome: Secondary | ICD-10-CM

## 2019-03-11 NOTE — Progress Notes (Signed)
PROCEDURE NOTE:  The patient request injection, verbal consent was obtained.  The left shoulder was prepped appropriately after time out was performed.   Sterile technique was observed and injection of 1 cc of Depo-Medrol 40 mg with several cc's of plain xylocaine. Anesthesia was provided by ethyl chloride and a 20-gauge needle was used to inject the shoulder area. A posterior approach was used.  The injection was tolerated well.  A band aid dressing was applied.  The patient was advised to apply ice later today and tomorrow to the injection sight as needed.  Return in one month.  Electronically Signed Sanjuana Kava, MD 12/9/202010:13 AM

## 2019-03-11 NOTE — Patient Instructions (Signed)

## 2019-03-23 ENCOUNTER — Telehealth: Payer: Self-pay

## 2019-03-23 MED ORDER — OXYCODONE HCL 15 MG PO TABS: ORAL_TABLET | ORAL | 0 refills | Status: DC

## 2019-03-23 NOTE — Telephone Encounter (Signed)
Oxycodone 15 mg Qty 140 Tablets  PATIENT USES [facility] 

## 2019-04-08 ENCOUNTER — Encounter: Payer: Self-pay | Admitting: Orthopaedic Surgery

## 2019-04-08 ENCOUNTER — Other Ambulatory Visit: Payer: Self-pay

## 2019-04-08 ENCOUNTER — Ambulatory Visit (INDEPENDENT_AMBULATORY_CARE_PROVIDER_SITE_OTHER): Payer: Medicare Other | Admitting: Orthopaedic Surgery

## 2019-04-08 DIAGNOSIS — G8929 Other chronic pain: Secondary | ICD-10-CM

## 2019-04-08 DIAGNOSIS — F17210 Nicotine dependence, cigarettes, uncomplicated: Secondary | ICD-10-CM | POA: Diagnosis not present

## 2019-04-08 DIAGNOSIS — G894 Chronic pain syndrome: Secondary | ICD-10-CM

## 2019-04-08 DIAGNOSIS — M25512 Pain in left shoulder: Secondary | ICD-10-CM

## 2019-04-08 NOTE — Progress Notes (Signed)
PROCEDURE NOTE:  The patient request injection, verbal consent was obtained.  The left shoulder was prepped appropriately after time out was performed.   Sterile technique was observed and injection of 1 cc of Depo-Medrol 40 mg with several cc's of plain xylocaine. Anesthesia was provided by ethyl chloride and a 20-gauge needle was used to inject the shoulder area. A posterior approach was used.  The injection was tolerated well.  A band aid dressing was applied.  The patient was advised to apply ice later today and tomorrow to the injection sight as needed.  Return in one month.  Call if any problem.  Precautions discussed.   Electronically Signed Sanjuana Kava, MD 1/6/202110:43 AM

## 2019-04-08 NOTE — Patient Instructions (Signed)

## 2019-04-16 ENCOUNTER — Other Ambulatory Visit: Payer: Self-pay | Admitting: Cardiology

## 2019-04-22 ENCOUNTER — Telehealth: Payer: Self-pay | Admitting: Orthopaedic Surgery

## 2019-04-22 MED ORDER — OXYCODONE HCL 15 MG PO TABS: ORAL_TABLET | ORAL | 0 refills | Status: DC

## 2019-04-22 NOTE — Telephone Encounter (Signed)
Patient requests refill on Oxycodone 15 mgs.  Qty  140  Sig: TAKE 1 TABLET BY MOUTH EVERY FIVE HOURS AS NEEDED FOR PAIN  Patient states she uses [facility] 

## 2019-04-23 ENCOUNTER — Telehealth: Payer: Self-pay | Admitting: Cardiology

## 2019-04-23 NOTE — Telephone Encounter (Signed)
Virtual Visit Pre-Appointment Phone Call  "(Name), I am calling you today to discuss your upcoming appointment. We are currently trying to limit exposure to the virus that causes COVID-19 by seeing patients at home rather than in the office."  1. "What is the BEST phone number to call the day of the visit?" - include this in appointment notes  2. Do you have or have access to (through a family member/friend) a smartphone with video capability that we can use for your visit?" a. If yes - list this number in appt notes as cell (if different from BEST phone #) and list the appointment type as a VIDEO visit in appointment notes b. If no - list the appointment type as a PHONE visit in appointment notes  Confirm consent - "In the setting of the current Covid19 crisis, you are scheduled for a (phone or video) visit with your provider on (date) at (time).  Just as we do with many in-office visits, in order for you to participate in this visit, we must obtain consent.  If you'd like, I can send this to your mychart (if signed up) or email for you to review.  Otherwise, I can obtain your verbal consent now.  All virtual visits are billed to your insurance company just like a normal visit would be.  By agreeing to a virtual visit, we'd like you to understand that the technology does not allow for your provider to perform an examination, and thus may limit your provider's ability to fully assess your condition. If your provider identifies any concerns that need to be evaluated in person, we will make arrangements to do so.  Finally, though the technology is pretty good, we cannot assure that it will always work on either your or our end, and in the setting of a video visit, we may have to convert it to a phone-only visit.  In either situation, we cannot ensure that we have a secure connection.  Are you willing to proceed?" STAFF: Did the patient verbally acknowledge consent to telehealth visit? Document  YES/NO here: YES 3. Advise patient to be prepared - "Two hours prior to your appointment, go ahead and check your blood pressure, pulse, oxygen saturation, and your weight (if you have the equipment to check those) and write them all down. When your visit starts, your provider will ask you for this information. If you have an Apple Watch or Kardia device, please plan to have heart rate information ready on the day of your appointment. Please have a pen and paper handy nearby the day of the visit as well."  4. Give patient instructions for MyChart download to smartphone OR Doximity/Doxy.me as below if video visit (depending on what platform provider is using)  5. Inform patient they will receive a phone call 15 minutes prior to their appointment time (may be from unknown caller ID) so they should be prepared to answer    [name] has been deemed a candidate for a follow-up tele-health visit to limit community exposure during the Covid-19 pandemic. I spoke with the patient via phone to ensure availability of phone/video source, confirm preferred email & phone number, and discuss instructions and expectations.  I reminded [name] to be prepared with any vital sign and/or heart rhythm information that could potentially be obtained via home monitoring, at the time of her visit. I reminded [name] to expect a phone call prior to her visit.  [name] 04/23/2019 2:05 PM   INSTRUCTIONS FOR DOWNLOADING THE MYCHART APP TO SMARTPHONE  - The patient must first make sure to have activated MyChart and know their login information - If Apple, go to [employer] and type in MyChart in the search bar and download the app. If Android, ask patient to go to [employer] and type in [place] in the search bar and download the app. The app is free but as with any other app downloads, their phone may require them to verify saved payment information or Apple/Android  password.  - The patient will need to then log into the app with their MyChart username and password, and select [facility] as their healthcare provider to link the account. When it is time for your visit, go to the MyChart app, find appointments, and click Begin Video Visit. Be sure to Select Allow for your device to access the Microphone and Camera for your visit. You will then be connected, and your provider will be with you shortly.  **If they have any issues connecting, or need assistance please contact MyChart service desk [phone])**  **If using a computer, in order to ensure the best quality for their visit they will need to use either of the following Internet Browsers: [employer], or Google Chrome**  IF USING DOXIMITY or DOXY.ME - The patient will receive a link just prior to their visit by text.     FULL LENGTH CONSENT FOR TELE-HEALTH VISIT   I hereby voluntarily request, consent and authorize [facility] and its employed or contracted physicians, physician assistants, nurse practitioners or other licensed health care professionals (the Practitioner), to provide me with telemedicine health care services (the Services") as deemed necessary by the treating Practitioner. I acknowledge and consent to receive the Services by the Practitioner via telemedicine. I understand that the telemedicine visit will involve communicating with the Practitioner through live audiovisual communication technology and the disclosure of certain medical information by electronic transmission. I acknowledge that I have been given the opportunity to request an in-person assessment or other available alternative prior to the telemedicine visit and am voluntarily participating in the telemedicine visit.  I understand that I have the right to withhold or withdraw my consent to the use of telemedicine in the course of my care at any time, without affecting my right to future care or treatment,  and that the Practitioner or I may terminate the telemedicine visit at any time. I understand that I have the right to inspect all information obtained and/or recorded in the course of the telemedicine visit and may receive copies of available information for a reasonable fee.  I understand that some of the potential risks of receiving the Services via telemedicine include:   Delay or interruption in medical evaluation due to technological equipment failure or disruption;  Information transmitted may not be sufficient (e.g. poor resolution of images) to allow for appropriate medical decision making by the Practitioner; and/or   In rare instances, security protocols could fail, causing a breach of personal health information.  Furthermore, I acknowledge that it is my responsibility to provide information about my medical history, conditions and care that is complete and accurate to the best of my ability. I acknowledge that Practitioner's advice, recommendations, and/or decision may be based on factors not within their control, such as incomplete or inaccurate data provided by me or distortions of diagnostic images or specimens that may result from electronic transmissions. I understand that the  practice of medicine is not an Chief Strategy Officer and that Practitioner makes no warranties or guarantees regarding treatment outcomes. I acknowledge that I will receive a copy of this consent concurrently upon execution via email to the email address I last provided but may also request a printed copy by calling the office of [facility].    I understand that my insurance will be billed for this visit.   I have read or had this consent read to me.  I understand the contents of this consent, which adequately explains the benefits and risks of the Services being provided via telemedicine.   I have been provided ample opportunity to ask questions regarding this consent and the Services and have had my questions  answered to my satisfaction.  I give my informed consent for the services to be provided through the use of telemedicine in my medical care  By participating in this telemedicine visit I agree to the above.

## 2019-05-06 ENCOUNTER — Ambulatory Visit (INDEPENDENT_AMBULATORY_CARE_PROVIDER_SITE_OTHER): Payer: Medicare Other | Admitting: Orthopaedic Surgery

## 2019-05-06 ENCOUNTER — Other Ambulatory Visit: Payer: Self-pay

## 2019-05-06 ENCOUNTER — Other Ambulatory Visit: Payer: Self-pay | Admitting: Orthopaedic Surgery

## 2019-05-06 ENCOUNTER — Encounter: Payer: Self-pay | Admitting: Orthopaedic Surgery

## 2019-05-06 DIAGNOSIS — G894 Chronic pain syndrome: Secondary | ICD-10-CM

## 2019-05-06 DIAGNOSIS — M25512 Pain in left shoulder: Secondary | ICD-10-CM

## 2019-05-06 DIAGNOSIS — G8929 Other chronic pain: Secondary | ICD-10-CM

## 2019-05-06 DIAGNOSIS — F17210 Nicotine dependence, cigarettes, uncomplicated: Secondary | ICD-10-CM | POA: Diagnosis not present

## 2019-05-06 NOTE — Patient Instructions (Signed)

## 2019-05-06 NOTE — Progress Notes (Signed)
PROCEDURE NOTE:  The patient request injection, verbal consent was obtained.  The left shoulder was prepped appropriately after time out was performed.   Sterile technique was observed and injection of 1 cc of Depo-Medrol 40 mg with several cc's of plain xylocaine. Anesthesia was provided by ethyl chloride and a 20-gauge needle was used to inject the shoulder area. A posterior approach was used.  The injection was tolerated well.  A band aid dressing was applied.  The patient was advised to apply ice later today and tomorrow to the injection sight as needed.  Return in one month.  Electronically Signed Sanjuana Kava, MD 2/3/202110:43 AM

## 2019-05-19 ENCOUNTER — Telehealth (INDEPENDENT_AMBULATORY_CARE_PROVIDER_SITE_OTHER): Payer: Medicare Other | Admitting: Cardiology

## 2019-05-19 ENCOUNTER — Encounter: Payer: Self-pay | Admitting: Cardiology

## 2019-05-19 VITALS — Ht 60.0 in | Wt 180.0 lb

## 2019-05-19 DIAGNOSIS — R0789 Other chest pain: Secondary | ICD-10-CM | POA: Diagnosis not present

## 2019-05-19 DIAGNOSIS — R600 Localized edema: Secondary | ICD-10-CM

## 2019-05-19 MED ORDER — SPIRONOLACTONE 25 MG PO TABS: 12.5000 mg | ORAL_TABLET | Freq: Every day | ORAL | 6 refills | Status: DC

## 2019-05-19 MED ORDER — METOPROLOL TARTRATE 25 MG PO TABS: 25.0000 mg | ORAL_TABLET | Freq: Two times a day (BID) | ORAL | 6 refills | Status: DC

## 2019-05-19 MED ORDER — TORSEMIDE 20 MG PO TABS: 40.0000 mg | ORAL_TABLET | Freq: Every day | ORAL | 6 refills | Status: DC

## 2019-05-19 NOTE — Patient Instructions (Signed)
Medication Instructions:   Increase Lopressor to 25mg  twice a day.  Stop Maxzide (Triamterene / Hydrochlorothiazide).  Begin Torsemide 20mg  - 2 tabs (40mg ) daily, as it does not come in a 40mg  tablet.  Begin Aldactone 12.5mg  daily - will have to break 25mg  tab in half, as it does not come in a 12.5mg  tablet.  New medications sent to [facility] today.   Continue all other medications.    Labwork:  BMET - order enclosed.   Please do in about 2 weeks after starting new medications.   Office will contact with results via phone or letter.    You may do lab at [facility] or at [facility] across the street.   Testing/Procedures: none  Follow-Up: 3 weeks   Any Other Special Instructions Will Be Listed Below (If Applicable).  If you need a refill on your cardiac medications before your next appointment, please call your pharmacy.

## 2019-05-19 NOTE — Progress Notes (Signed)
Virtual Visit via Telephone Note   This visit type was conducted due to national recommendations for restrictions regarding the COVID-19 Pandemic (e.g. social distancing) in an effort to limit this patient's exposure and mitigate transmission in our community.  Due to her co-morbid illnesses, this patient is at least at moderate risk for complications without adequate follow up.  This format is felt to be most appropriate for this patient at this time.  The patient did not have access to video technology/had technical difficulties with video requiring transitioning to audio format only (telephone).  All issues noted in this document were discussed and addressed.  No physical exam could be performed with this format.  Please refer to the patient's chart for her  consent to telehealth for [facility]   Date:  05/19/2019   ID:  [name], DOB 1960/05/01, MRN [id number]  Patient Location: Home Provider Location: Office  PCP:  Brandi Burly, MD  Cardiologist:  Brandi Dolly, MD  Electrophysiologist:  None   Evaluation Performed:  Follow-Up Visit  Chief Complaint:  Follow up  History of Present Illness:    [name] is a 59 y.o. female seen today for follow up of the following medical problems.   1. Chest pain CAD risk factors: HTN, diet controlled DM2 previously on insulin, +tobacco x 25 years. Mother "heart troubles" in her mid 61s, multiple siblings with heart troubles at early age. - echo 10/2016 LVEF 60-65%.  - no showed for lexiscan 01/2017 - 09/2018 echo LVEF 60-65%, normal RV function.   - was to have repeat stress test in 09/2018. Episode of syncope and SOB with wearing a facemask, seen in ER without evidence of ACS. She refused to reschedule test - last visit with [facility] emperic trial of imdur - headaches on imdur, she stopped taking. We changed to norvasc 2.5mg  daily   - 2 episodes since last visit. Last episode about 1 week ago. Episode of  palpitations, brought on by leg pains and cramping. Progressed to chest discomfort. She is no longer taking lopressor, she is not sure why or when she stopped.     2. LE edema 09/2018 echo LVEF 60-65%, normal RV function.  - reports worsening edema - we had her on torsemide. At some point she was changed to [facility] by another provider Im not sure the indication - weight up to 180 lbs, she was 166 lbs back in 12/2018   3. Chronic left shoulder pain - followed by pcp  4. Chronic neck/back pain - followed by pcp - prior surgeries  5. 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    Past Medical History:  Diagnosis Date  . Back pain   . Depression   . Fibromyalgia   . GERD (gastroesophageal reflux disease)   . Hypertension    diet control  . Hypothyroid   . IBS (irritable bowel syndrome)   . Migraines   . Obesity   . Osteoarthritis   . Sleep apnea    Past Surgical History:  Procedure Laterality Date  . AMPUTATION  12/13/2011   Procedure: AMPUTATION DIGIT;  Surgeon: [name], DPM;  Location: [facility];  Service: Orthopedics;  Laterality: Right;  amputation second toe right foot  . BACK SURGERY    . BIOPSY  12/23/2015   Procedure: BIOPSY;  Surgeon: Brandi Houston, MD;  Location: [facility] SU[facility];  Service: Endoscopy;;  pre pyloric patches  . BREAST BIOPSY    . CHOLECYSTECTOMY    . CYSTOSCOPY W/ URETERAL STENT PLACEMENT Left 10/09/2017   Procedure: CYSTOSCOPY WITH RETROGRADE PYELOGRAM/URETERAL STENT PLACEMENT;  Surgeon: Brandi Gustin, MD;  Location: [facility];  Service: Urology;  Laterality: Left;  . CYSTOSCOPY WITH RETROGRADE PYELOGRAM, URETEROSCOPY AND STENT PLACEMENT Left 10/23/2017   Procedure: CYSTOSCOPY WITH LEFT RETROGRADE PYELOGRAM, LEFT URETEROSCOPY AND STENT EXCHANGE;  Surgeon: Brandi Gustin, MD;  Location: [facility];   Service: Urology;  Laterality: Left;  . ESOPHAGOGASTRODUODENOSCOPY  05/28/2011   Procedure: ESOPHAGOGASTRODUODENOSCOPY (EGD);  Surgeon: Brandi Houston, MD;  Location: [facility];  Service: Endoscopy;  Laterality: N/A;  730  . ESOPHAGOGASTRODUODENOSCOPY N/A 10/09/2013   Procedure: ESOPHAGOGASTRODUODENOSCOPY (EGD);  Surgeon: Brandi Houston, MD;  Location: [facility];  Service: Endoscopy;  Laterality: N/A;  730  . ESOPHAGOGASTRODUODENOSCOPY (EGD) WITH PROPOFOL N/A 12/23/2015   Procedure: ESOPHAGOGASTRODUODENOSCOPY (EGD) WITH PROPOFOL;  Surgeon: Brandi Houston, MD;  Location[facility];  Service: Endoscopy;  Laterality: N/A;  . HERNIA REPAIR    . HOLMIUM LASER APPLICATION Left [id number]   Procedure: LEFT URETEROSCOPY WITH HOLMIUM LASER LITHOTRIPSY;  Surgeon: Brandi Gustin, MD;  Location: [facility];  Service: Urology;  Laterality: Left;  Marland Kitchen MALONEY DILATION N/A 10/09/2013   Procedure: MALONEY DILATION;  Surgeon: Brandi Houston, MD;  Location: [facility];  Service: Endoscopy;  Laterality: N/A;  . NASAL SEPTUM SURGERY    . [place], 2008   2 yrs ago  . TOTAL ABDOMINAL HYSTERECTOMY     Left oophroectomy for a lare tumor about 21 yrs. RT ovary removed time of hysterectomy     No outpatient medications have been marked as taking for the 05/19/19 encounter (Appointment) with Brandi Lenis, MD.     Allergies:   Cephalexin, Latex, Betadine [povidone iodine], Cleocin [clindamycin hcl], Other, Sulfa antibiotics, and Toradol [ketorolac tromethamine]   Social History   Tobacco Use  . Smoking status: Current Every Day Smoker    Packs/day: 0.25    Years: 35.00    Pack years: 8.75    Types: Cigarettes  . Smokeless tobacco: Never Used  . Tobacco comment: 1 pack a day since age18   Substance Use Topics  . Alcohol use: No  . Drug use: No     Family Hx: The patient's family history includes CAD in her mother; Cancer in her mother and sister; Hypertension in her brother;  Liver disease in her brother; Obesity in her brother.  ROS:   Please see the history of present illness.     All other systems reviewed and are negative.   Prior CV studies:   The following studies were reviewed today:  10/2016 echo Study Conclusions  - Left ventricle: The cavity size was normal. Wall thickness was normal. Systolic function was normal. The estimated ejection fraction was in the range of 60% to 65%. Wall motion was normal; there were no regional wall motion abnormalities. Left ventricular diastolic function parameters were normal. - Mitral valve: There was trivial regurgitation. - Right atrium: Central venous pressure (est): 8 mm Hg. - Atrial septum: No defect or patent foramen ovale was identified. - Tricuspid valve: There was trivial regurgitation. - Pulmonary arteries: PA peak pressure: 15 mm Hg (S). - Pericardium, extracardiac: There was no pericardial effusion.  Impressions:  - Normal LV wall thickness with LVEF 60-65% and normal diastolic function. Trivial  mitral and tricuspid regurgitation. Normal estimated PASP 15 mmHg.  09/2018 echo IMPRESSIONS   1. The left ventricle has normal systolic function with an ejection fraction of 60-65%. The cavity size was normal. Left ventricular diastolic parameters were normal. No evidence of left ventricular regional wall motion abnormalities. 2. The right ventricle has normal systolic function. The cavity was normal. There is mildly increased right ventricular wall thickness. 3. Left atrial size was mildly dilated. 4. The mitral valve is grossly normal. There is mild mitral annular calcification present. 5. The tricuspid valve is grossly normal. 6. The aortic valve is grossly normal. 7. The aortic root is normal in size and structure. 8. The inferior vena cava was dilated in size with <50% respiratory variability.  Labs/Other Tests and Data Reviewed:    EKG:  No ECG reviewed.  Recent  Labs: 12/24/2018: TSH 0.811 01/12/2019: ALT 19; BUN 16; Creatinine, Ser 0.86; Hemoglobin 13.0; Platelets 156; Potassium 3.1; Sodium 136   Recent Lipid Panel Lab Results  Component Value Date/Time   CHOL  10/16/2006 01:39 AM    145        ATP III CLASSIFICATION:  <200     mg/dL   Desirable  200-239  mg/dL   Borderline High  >=240    mg/dL   High   TRIG 408 (H) 10/16/2006 01:39 AM   HDL <10 (L) 10/16/2006 01:39 AM   CHOLHDL NOT CALCULATED 10/16/2006 01:39 AM   LDLCALC  10/16/2006 01:39 AM    UNABLE TO CALCULATE IF TRIGLYCERIDE OVER 400 mg/dL        Total Cholesterol/HDL:CHD Risk Coronary Heart Disease Risk Table                     Men   Women  1/2 Average Risk   3.4   3.3    Wt Readings from Last 3 Encounters:  01/12/19 166 lb (75.3 kg)  12/24/18 166 lb (75.3 kg)  12/22/18 166 lb 6.4 oz (75.5 kg)     Objective:    Vital Signs:   Today's Vitals   05/19/19 0949  Weight: 180 lb (81.6 kg)  Height: 5' (1.524 m)   Body mass index is 35.15 kg/m.  Normal affect. Normal speech pattern and tone. Comfortable, no apparent distress. No audible signs of SOB or wheezing.   ASSESSMENT & PLAN:    1. Chest pain - chronic somewhat atypical chest pain - most recent episode associated with significant palpitations - off beta blocker, I don't know why she stopped. Restart lopressor 25mg  bid and monitor symptoms   2. LE edema - reports severe swelling, weight up to 180 lbs - at some point her loop diuretic was changed to maxide, im not sure the indication. Maxide is insufficent for her diuresis. D/c maxide, start torsemide 40mg  daily. Add aldactone 12.5 mg daily due to prior issues with hypokalemia.  - check BMET/Mg in 2 weeks   F/u 3 weeks in clinic. Asked to bring all her pill bottles with her  COVID-19 Education: The signs and symptoms of COVID-19 were discussed with the patient and how to seek care for testing (follow up with PCP or arrange E-visit).  The importance of  social distancing was discussed today.  Time:   Today, I have spent 23 minutes with the patient with telehealth technology discussing the above problems.     Medication Adjustments/Labs and Tests Ordered: Current medicines are reviewed at length with the patient today.  Concerns regarding medicines are outlined  above.   Tests Ordered: No orders of the defined types were placed in this encounter.   Medication Changes: No orders of the defined types were placed in this encounter.   Follow Up:  In Person in 3 week(s)  Signed, Brandi Dolly, MD  05/19/2019 8:19 AM    [place]

## 2019-05-20 ENCOUNTER — Inpatient Hospital Stay (HOSPITAL_COMMUNITY): Payer: Medicare Other | Attending: Hematology

## 2019-05-20 ENCOUNTER — Telehealth: Payer: Self-pay | Admitting: Orthopaedic Surgery

## 2019-05-20 ENCOUNTER — Other Ambulatory Visit: Payer: Self-pay

## 2019-05-20 DIAGNOSIS — D472 Monoclonal gammopathy: Secondary | ICD-10-CM | POA: Insufficient documentation

## 2019-05-20 DIAGNOSIS — G629 Polyneuropathy, unspecified: Secondary | ICD-10-CM | POA: Insufficient documentation

## 2019-05-20 DIAGNOSIS — M810 Age-related osteoporosis without current pathological fracture: Secondary | ICD-10-CM | POA: Diagnosis not present

## 2019-05-20 DIAGNOSIS — E039 Hypothyroidism, unspecified: Secondary | ICD-10-CM | POA: Insufficient documentation

## 2019-05-20 LAB — COMPREHENSIVE METABOLIC PANEL
ALT: 16 U/L (ref 0–44)
AST: 19 U/L (ref 15–41)
Albumin: 3 g/dL — ABNORMAL LOW (ref 3.5–5.0)
Alkaline Phosphatase: 63 U/L (ref 38–126)
Anion gap: 6 (ref 5–15)
BUN: 12 mg/dL (ref 6–20)
CO2: 26 mmol/L (ref 22–32)
Calcium: 8.8 mg/dL — ABNORMAL LOW (ref 8.9–10.3)
Chloride: 108 mmol/L (ref 98–111)
Creatinine, Ser: 0.68 mg/dL (ref 0.44–1.00)
GFR calc Af Amer: >60 mL/min (ref >60)
GFR calc non Af Amer: >60 mL/min (ref >60)
Glucose, Bld: 107 mg/dL — ABNORMAL HIGH (ref 70–99)
Potassium: 4 mmol/L (ref 3.5–5.1)
Sodium: 140 mmol/L (ref 135–145)
Total Bilirubin: 0.3 mg/dL (ref 0.3–1.2)
Total Protein: 7.6 g/dL (ref 6.5–8.1)

## 2019-05-20 LAB — CBC WITH DIFFERENTIAL/PLATELET
Abs Immature Granulocytes: 0.01 10*3/uL (ref 0.00–0.07)
Basophils Absolute: 0 10*3/uL (ref 0.0–0.1)
Basophils Relative: 1 %
Eosinophils Absolute: 0.2 10*3/uL (ref 0.0–0.5)
Eosinophils Relative: 6 %
HCT: 39.9 % (ref 36.0–46.0)
Hemoglobin: 12.4 g/dL (ref 12.0–15.0)
Immature Granulocytes: 0 %
Lymphocytes Relative: 42 %
Lymphs Abs: 1.7 10*3/uL (ref 0.7–4.0)
MCH: 29.4 pg (ref 26.0–34.0)
MCHC: 31.1 g/dL (ref 30.0–36.0)
MCV: 94.5 fL (ref 80.0–100.0)
Monocytes Absolute: 0.3 10*3/uL (ref 0.1–1.0)
Monocytes Relative: 8 %
Neutro Abs: 1.8 10*3/uL (ref 1.7–7.7)
Neutrophils Relative %: 43 %
Platelets: 178 10*3/uL (ref 150–400)
RBC: 4.22 MIL/uL (ref 3.87–5.11)
RDW: 14.6 % (ref 11.5–15.5)
WBC: 4 10*3/uL (ref 4.0–10.5)
nRBC: 0 % (ref 0.0–0.2)

## 2019-05-20 LAB — LACTATE DEHYDROGENASE: LDH: 129 U/L (ref 98–192)

## 2019-05-20 MED ORDER — OXYCODONE HCL 15 MG PO TABS: ORAL_TABLET | ORAL | 0 refills | Status: DC

## 2019-05-20 NOTE — Telephone Encounter (Signed)
Patient requests refill Oxycodone 15 mgs.  Qty  140  Sig: TAKE 1 TABLET BY MOUTH EVERY FIVE HOURS AS NEEDED FOR PAIN  Patient states she  Uses [facility]

## 2019-05-21 LAB — KAPPA/LAMBDA LIGHT CHAINS
Kappa free light chain: 56.7 mg/L — ABNORMAL HIGH (ref 3.3–19.4)
Kappa, lambda light chain ratio: 1.71 — ABNORMAL HIGH (ref 0.26–1.65)
Lambda free light chains: 33.1 mg/L — ABNORMAL HIGH (ref 5.7–26.3)

## 2019-05-22 LAB — PROTEIN ELECTROPHORESIS, SERUM
A/G Ratio: 0.8 (ref 0.7–1.7)
Albumin ELP: 3.3 g/dL (ref 2.9–4.4)
Alpha-1-Globulin: 0.3 g/dL (ref 0.0–0.4)
Alpha-2-Globulin: 0.7 g/dL (ref 0.4–1.0)
Beta Globulin: 0.8 g/dL (ref 0.7–1.3)
Gamma Globulin: 2.6 g/dL — ABNORMAL HIGH (ref 0.4–1.8)
Globulin, Total: 4.4 g/dL — ABNORMAL HIGH (ref 2.2–3.9)
M-Spike, %: 1.8 g/dL — ABNORMAL HIGH
Total Protein ELP: 7.7 g/dL (ref 6.0–8.5)

## 2019-05-27 ENCOUNTER — Encounter (HOSPITAL_COMMUNITY): Payer: Self-pay | Admitting: Hematology

## 2019-05-27 ENCOUNTER — Other Ambulatory Visit: Payer: Self-pay

## 2019-05-27 ENCOUNTER — Inpatient Hospital Stay (HOSPITAL_BASED_OUTPATIENT_CLINIC_OR_DEPARTMENT_OTHER): Payer: Medicare Other | Admitting: Hematology

## 2019-05-27 VITALS — BP 133/58 | HR 69 | Temp 97.2°F | Resp 18

## 2019-05-27 DIAGNOSIS — D472 Monoclonal gammopathy: Secondary | ICD-10-CM

## 2019-05-27 NOTE — Assessment & Plan Note (Signed)
1. IgG kappa MGUS: -Evaluation for peripheral neuropathy showed 1.8 g of IgG kappa monoclonal protein. -PET scan on 01/26/2019 did not show any evidence of hypermetabolic soft tissue or osseous multiple myeloma. -We reviewed labs from 05/20/2019. Hemoglobin is 12.4. Calcium is 8.8 and creatinine 0.68. LDH was 129. -M spike is stable at 1.8 g, previously 1.8 g in September 2020. Free light chain ratio is also stable at 1.71, previously 1.74. Kappa light chains are 57, previously 65. Lambda light chains at 33, previously 37. -She has refused bone marrow biopsy in the past. We will continue to monitor her closely. She will come back in 4 months for follow-up with repeat labs. We will do skeletal survey in a year from PET scan.  2. Osteoporosis: -DEXA scan from 06/10/2017 shows T score -4.6. -She reportedly received Reclast injection one dose and was lost to follow-up. -We will talk to her about it next visit.  3. Peripheral neuropathy: -She reportedly developed neuropathy in her 71s and has burning sensation in the feet extending all the way up to the knees. -Also burning sensation in the upper extremities to the level of her shoulders. She is on Lyrica 100 mg three times a day.

## 2019-05-27 NOTE — Patient Instructions (Signed)
[facility] at [facility] Discharge Instructions  You were seen today by Dr. Katragadda. He went over your recent lab results. He will see you back in 4 months for labs and follow up.   Thank you for choosing [facility] at [facility] to provide your oncology and hematology care.  To afford each patient quality time with our provider, please arrive at least 15 minutes before your scheduled appointment time.   If you have a lab appointment with the Cancer Center please come in thru the  Main Entrance and check in at the main information desk  You need to re-schedule your appointment should you arrive 10 or more minutes late.  We strive to give you quality time with our providers, and arriving late affects you and other patients whose appointments are after yours.  Also, if you no show three or more times for appointments you may be dismissed from the clinic at the providers discretion.     Again, thank you for choosing [facility].  Our hope is that these requests will decrease the amount of time that you wait before being seen by our physicians.       _____________________________________________________________  Should you have questions after your visit to  Cancer Center, please contact our office at [phone] between the hours of 8:00 a.m. and 4:30 p.m.  Voicemails left after 4:00 p.m. will not be returned until the following business day.  For prescription refill requests, have your pharmacy contact our office and allow 72 hours.    Cancer Center Support Programs:   > Cancer Support Group  2nd Tuesday of the month 1pm-2pm, Journey Room    

## 2019-05-27 NOTE — Progress Notes (Signed)
[place],  [id number]   CLINIC:  Medical Oncology/Hematology  PCP:  Neale Burly, MD [place] [phone]   REASON FOR VISIT:  Follow-up for MGUS.  CURRENT THERAPY: Observation.    INTERVAL HISTORY:  [name] 59 y.o. female seen for follow-up of IgG kappa monoclonal gammopathy. Denies any new onset bone pains. She has chronic pains all over the body which are stable. Appetite is 25%. Energy levels are low. Occasional dizziness and numbness in the extremities has been stable. Denies any fevers or recurrent infections. Has chronic ulcers on her legs which are stable.    REVIEW OF SYSTEMS:  Review of Systems  Constitutional: Positive for fatigue.  Cardiovascular: Positive for leg swelling.  Neurological: Positive for dizziness and numbness.  All other systems reviewed and are negative.    PAST MEDICAL/SURGICAL HISTORY:  Past Medical History:  Diagnosis Date  . Back pain   . Depression   . Fibromyalgia   . GERD (gastroesophageal reflux disease)   . Hypertension    diet control  . Hypothyroid   . IBS (irritable bowel syndrome)   . Migraines   . Obesity   . Osteoarthritis   . Sleep apnea    Past Surgical History:  Procedure Laterality Date  . AMPUTATION  12/13/2011   Procedure: AMPUTATION DIGIT;  Surgeon: Marcheta Grammes, DPM;  Location: [facility];  Service: Orthopedics;  Laterality: Right;  amputation second toe right foot  . BACK SURGERY    . BIOPSY  12/23/2015   Procedure: BIOPSY;  Surgeon: Rogene Houston, MD;  Location: [facility];  Service: Endoscopy;;  pre pyloric patches  . BREAST BIOPSY    . CHOLECYSTECTOMY    . CYSTOSCOPY W/ URETERAL STENT PLACEMENT Left 10/09/2017   Procedure: CYSTOSCOPY WITH RETROGRADE PYELOGRAM/URETERAL STENT PLACEMENT;  Surgeon: Cleon Gustin, MD;  Location: [facility];  Service: Urology;  Laterality: Left;  . CYSTOSCOPY WITH RETROGRADE PYELOGRAM,  URETEROSCOPY AND STENT PLACEMENT Left 10/23/2017   Procedure: CYSTOSCOPY WITH LEFT RETROGRADE PYELOGRAM, LEFT URETEROSCOPY AND STENT EXCHANGE;  Surgeon: Cleon Gustin, MD;  Location: [facility];  Service: Urology;  Laterality: Left;  . ESOPHAGOGASTRODUODENOSCOPY  05/28/2011   Procedure: ESOPHAGOGASTRODUODENOSCOPY (EGD);  Surgeon: Rogene Houston, MD;  Location: [facility];  Service: Endoscopy;  Laterality: N/A;  730  . ESOPHAGOGASTRODUODENOSCOPY N/A 10/09/2013   Procedure: ESOPHAGOGASTRODUODENOSCOPY (EGD);  Surgeon: Rogene Houston, MD;  [facility]: [facility];  Service: Endoscopy;  Laterality: N/A;  730  . ESOPHAGOGASTRODUODENOSCOPY (EGD) WITH PROPOFOL N/A 12/23/2015   Procedure: ESOPHAGOGASTRODUODENOSCOPY (EGD) WITH PROPOFOL;  Surgeon: Rogene Houston, MD;  [facility]: [facility];  Service: Endoscopy;  Laterality: N/A;  . HERNIA REPAIR    . HOLMIUM LASER APPLICATION Left 10/25/2033   Procedure: LEFT URETEROSCOPY WITH HOLMIUM LASER LITHOTRIPSY;  Surgeon: Cleon Gustin, MD;  Location: [facility];  Service: Urology;  Laterality: Left;  Marland Kitchen MALONEY DILATION N/A 10/09/2013   Procedure: MALONEY DILATION;  Surgeon: Rogene Houston, MD;  Location: [facility];  Service: Endoscopy;  Laterality: N/A;  . NASAL SEPTUM SURGERY    . [place], 2008   2 yrs ago  . TOTAL ABDOMINAL HYSTERECTOMY     Left oophroectomy for a lare tumor about 21 yrs. RT ovary removed time of hysterectomy     SOCIAL HISTORY:  Social History   Socioeconomic History  . Marital status: Single  Spouse name: Not on file  . Number of children: 0  . Years of education: 9  . Highest education level: Not on file  Occupational History  . Occupation: Unemployed  Tobacco Use  . Smoking status: Current Every Day Smoker    Packs/day: 0.25    Years: 35.00    Pack years: 8.75    Types: Cigarettes  . Smokeless tobacco: Never Used  . Tobacco comment: 2/16 - 4 ciggs per day   Substance and Sexual Activity  . Alcohol  use: No  . Drug use: No  . Sexual activity: Not on file  Other Topics Concern  . Not on file  Social History Narrative   Lives with cousin, sister-in-law, nephew   Caffeine- rarely   Social Determinants of Health   Financial Resource Strain:   . Difficulty of Paying Living Expenses: Not on file  Food Insecurity:   . Worried About Charity fundraiser in the Last Year: Not on file  . Ran Out of Food in the Last Year: Not on file  Transportation Needs:   . Lack of Transportation (Medical): Not on file  . Lack of Transportation (Non-Medical): Not on file  Physical Activity:   . Days of Exercise per Week: Not on file  . Minutes of Exercise per Session: Not on file  Stress:   . Feeling of Stress : Not on file  Social Connections:   . Frequency of Communication with Friends and Family: Not on file  . Frequency of Social Gatherings with Friends and Family: Not on file  . Attends Religious Services: Not on file  . Active Member of Clubs or Organizations: Not on file  . Attends Archivist Meetings: Not on file  . Marital Status: Not on file  Intimate Partner Violence:   . Fear of Current or Ex-Partner: Not on file  . Emotionally Abused: Not on file  . Physically Abused: Not on file  . Sexually Abused: Not on file    FAMILY HISTORY:  Family History  Problem Relation Age of Onset  . CAD Mother   . Cancer Mother   . Obesity Brother   . Cancer Sister   . Liver disease Brother   . Hypertension Brother     CURRENT MEDICATIONS:  Outpatient Encounter Medications as of 05/27/2019  Medication Sig Note  . amLODipine (NORVASC) 2.5 MG tablet TAKE 1 TABLET BY MOUTH EVERY DAY   . beta carotene w/minerals (OCUVITE) tablet Take 1 tablet by mouth daily.     Mariane Baumgarten Sodium 100 MG capsule Take 100 mg by mouth 2 (two) times daily.   . DULoxetine (CYMBALTA) 20 MG capsule Take 20 mg by mouth 2 (two) times daily.   Marland Kitchen esomeprazole (NEXIUM) 40 MG capsule Take 1 capsule (40 mg total)  by mouth daily before breakfast.   . furosemide (LASIX) 40 MG tablet TAKE 1 TABLET TODAY AND TOMORROW (Patient taking differently: Take 40 mg by mouth daily. TAKE 1 TABLET TODAY AND TOMORROW)   . levothyroxine (SYNTHROID) 125 MCG tablet Take 125 mcg by mouth daily.   Marland Kitchen levothyroxine (SYNTHROID) 150 MCG tablet Take 125 mcg by mouth daily before breakfast.    . linaclotide (LINZESS) 290 MCG CAPS capsule Take 290 mcg by mouth daily before breakfast.   . metoprolol tartrate (LOPRESSOR) 25 MG tablet Take 1 tablet (25 mg total) by mouth 2 (two) times daily.   . Multiple Vitamins-Iron (MULTIVITAMINS WITH IRON) TABS Take 1 tablet by mouth daily.   Marland Kitchen  Omega-3 Fatty Acids (FISH OIL) 1000 MG CAPS TAKE TWO CAPSULES BY MOUTH TWICE DAILY   . polyethylene glycol (MIRALAX / GLYCOLAX) packet Take 17 g by mouth 2 (two) times daily before a meal.     . potassium chloride (MICRO-K) 10 MEQ CR capsule Take 20 mEq by mouth daily.   . pregabalin (LYRICA) 100 MG capsule Take 100 mg by mouth 3 (three) times daily.  12/24/2018: 12/24/18 taking two a day  . Prucalopride Succinate (MOTEGRITY) 2 MG TABS Take 2 mg by mouth daily.   . silver sulfADIAZINE (SSD) 1 % cream APPLY TO THE AFFECTED AREA(S) TWICE DAILY UNTIL HEALED   . spironolactone (ALDACTONE) 25 MG tablet Take 0.5 tablets (12.5 mg total) by mouth daily.   . sucralfate (CARAFATE) 1 g tablet Take 1 g by mouth 4 (four) times daily.   Marland Kitchen topiramate (TOPAMAX) 100 MG tablet Take 50 mg by mouth 2 (two) times daily.  12/24/2018: 12/24/18 taking 100 mg, 2 daily  . torsemide (DEMADEX) 20 MG tablet Take 2 tablets (40 mg total) by mouth daily.   . traZODone (DESYREL) 50 MG tablet Take 50 mg by mouth at bedtime.    . Zolpidem Tartrate (AMBIEN PO) Take 5 mg by mouth daily.    . diclofenac Sodium (VOLTAREN) 1 % GEL apply FOUR grams TO affected joints AS NEEDED FOUR TIMES DAILY (Patient not taking: Reported on 05/27/2019)   . hydrOXYzine (VISTARIL) 25 MG capsule Take 25 mg by mouth every  6 (six) hours as needed for anxiety.    Marland Kitchen oxyCODONE (ROXICODONE) 15 MG immediate release tablet TAKE 1 TABLET BY MOUTH EVERY FIVE HOURS AS NEEDED FOR PAIN (Patient not taking: Reported on 05/27/2019)   . SUMAtriptan (IMITREX) 100 MG tablet TAKE 1 TABLET BY MOUTH AT ONSET OF HEADACHE. MAY REPEAT ONCE IN TWO HOURS. NO MORE THAN TWO TABLETS IN 24 HOURS   . VENTOLIN HFA 108 (90 Base) MCG/ACT inhaler Inhale 1-2 puffs into the lungs every 6 (six) hours as needed for wheezing or shortness of breath.  10/08/2017: Patient states that she has been unable to use since she no longer has an aero chamber. Patient states that she has no strength to use the HFA herself and insurance does not cover the chamber.   . [DISCONTINUED] doxycycline (ADOXA) 100 MG tablet Take 100 mg by mouth 2 (two) times daily.    No facility-administered encounter medications on file as of 05/27/2019.    ALLERGIES:  Allergies  Allergen Reactions  . Cephalexin Hives    unknown  . Latex Rash  . Betadine [Povidone Iodine] Rash  . Cleocin [Clindamycin Hcl] Hives  . Other     Band aides - rash   . Sulfa Antibiotics     Vomiting   . Toradol [Ketorolac Tromethamine] Nausea And Vomiting    migraine     PHYSICAL EXAM:  ECOG Performance status: 2  Vitals:   05/27/19 1508  BP: (!) 133/58  Pulse: 69  Resp: 18  Temp: (!) 97.2 F (36.2 C)  SpO2: 98%   There were no vitals filed for this visit.  Physical Exam Vitals reviewed.  Constitutional:      Appearance: Normal appearance.  Cardiovascular:     Rate and Rhythm: Normal rate and regular rhythm.     Heart sounds: Normal heart sounds.  Pulmonary:     Effort: Pulmonary effort is normal.     Breath sounds: Normal breath sounds.  Skin:    General: Skin is warm.  Neurological:     General: No focal deficit present.     Mental Status: She is alert and oriented to person, place, and time.  Psychiatric:        Mood and Affect: Mood normal.        Behavior: Behavior normal.       LABORATORY DATA:  I have reviewed the labs as listed.  CBC    Component Value Date/Time   WBC 4.0 05/20/2019 1257   RBC 4.22 05/20/2019 1257   HGB 12.4 05/20/2019 1257   HCT 39.9 05/20/2019 1257   PLT 178 05/20/2019 1257   MCV 94.5 05/20/2019 1257   MCH 29.4 05/20/2019 1257   MCHC 31.1 05/20/2019 1257   RDW 14.6 05/20/2019 1257   LYMPHSABS 1.7 05/20/2019 1257   MONOABS 0.3 05/20/2019 1257   EOSABS 0.2 05/20/2019 1257   BASOSABS 0.0 05/20/2019 1257   CMP Latest Ref Rng & Units 05/20/2019 01/12/2019 12/24/2018  Glucose 70 - 99 mg/dL 107(H) 92 -  BUN 6 - 20 mg/dL 12 16 -  Creatinine 0.44 - 1.00 mg/dL 0.68 0.86 -  Sodium 135 - 145 mmol/L 140 136 -  Potassium 3.5 - 5.1 mmol/L 4.0 3.1(L) -  Chloride 98 - 111 mmol/L 108 103 -  CO2 22 - 32 mmol/L 26 25 -  Calcium 8.9 - 10.3 mg/dL 8.8(L) 8.3(L) -  Total Protein 6.5 - 8.1 g/dL 7.6 7.8 8.0  Total Bilirubin 0.3 - 1.2 mg/dL 0.3 0.2(L) -  Alkaline Phos 38 - 126 U/L 63 57 -  AST 15 - 41 U/L 19 20 -  ALT 0 - 44 U/L 16 19 -       DIAGNOSTIC IMAGING:  I have independently reviewed the scans and discussed with the patient.   ASSESSMENT & PLAN:   IgG monoclonal gammopathy 1. IgG kappa MGUS: -Evaluation for peripheral neuropathy showed 1.8 g of IgG kappa monoclonal protein. -PET scan on 01/26/2019 did not show any evidence of hypermetabolic soft tissue or osseous multiple myeloma. -We reviewed labs from 05/20/2019. Hemoglobin is 12.4. Calcium is 8.8 and creatinine 0.68. LDH was 129. -M spike is stable at 1.8 g, previously 1.8 g in September 2020. Free light chain ratio is also stable at 1.71, previously 1.74. Kappa light chains are 57, previously 65. Lambda light chains at 33, previously 37. -She has refused bone marrow biopsy in the past. We will continue to monitor her closely. She will come back in 4 months for follow-up with repeat labs. We will do skeletal survey in a year from PET scan.  2. Osteoporosis: -DEXA scan from  06/10/2017 shows T score -4.6. -She reportedly received Reclast injection one dose and was lost to follow-up. -We will talk to her about it next visit.  3. Peripheral neuropathy: -She reportedly developed neuropathy in her 84s and has burning sensation in the feet extending all the way up to the knees. -Also burning sensation in the upper extremities to the level of her shoulders. She is on Lyrica 100 mg three times a day.      Orders placed this encounter:  Orders Placed This Encounter  Procedures  . Protein electrophoresis, serum  . Kappa/lambda light chains  . Lactate dehydrogenase  . CBC with Differential/Platelet  . Comprehensive metabolic panel      Derek Jack, MD [place] [phone]

## 2019-06-03 ENCOUNTER — Encounter: Payer: Self-pay | Admitting: Orthopaedic Surgery

## 2019-06-03 ENCOUNTER — Other Ambulatory Visit: Payer: Self-pay

## 2019-06-03 ENCOUNTER — Ambulatory Visit (INDEPENDENT_AMBULATORY_CARE_PROVIDER_SITE_OTHER): Payer: Medicare Other | Admitting: Orthopaedic Surgery

## 2019-06-03 DIAGNOSIS — G8929 Other chronic pain: Secondary | ICD-10-CM | POA: Diagnosis not present

## 2019-06-03 DIAGNOSIS — G609 Hereditary and idiopathic neuropathy, unspecified: Secondary | ICD-10-CM

## 2019-06-03 DIAGNOSIS — F17210 Nicotine dependence, cigarettes, uncomplicated: Secondary | ICD-10-CM

## 2019-06-03 DIAGNOSIS — G894 Chronic pain syndrome: Secondary | ICD-10-CM | POA: Diagnosis not present

## 2019-06-03 DIAGNOSIS — M25512 Pain in left shoulder: Secondary | ICD-10-CM

## 2019-06-03 NOTE — Patient Instructions (Signed)

## 2019-06-03 NOTE — Progress Notes (Signed)
PROCEDURE NOTE:  The patient request injection, verbal consent was obtained.  The left shoulder was prepped appropriately after time out was performed.   Sterile technique was observed and injection of 1 cc of Depo-Medrol 40 mg with several cc's of plain xylocaine. Anesthesia was provided by ethyl chloride and a 20-gauge needle was used to inject the shoulder area. A posterior approach was used.  The injection was tolerated well.  A band aid dressing was applied.  The patient was advised to apply ice later today and tomorrow to the injection sight as needed.  Return in one month.  Call if any problem.  Precautions discussed.   Electronically Signed Sanjuana Kava, MD 3/3/202110:03 AM

## 2019-06-11 ENCOUNTER — Other Ambulatory Visit: Payer: Self-pay

## 2019-06-11 ENCOUNTER — Encounter: Payer: Self-pay | Admitting: Cardiology

## 2019-06-11 ENCOUNTER — Ambulatory Visit (INDEPENDENT_AMBULATORY_CARE_PROVIDER_SITE_OTHER): Payer: Medicare Other | Admitting: Cardiology

## 2019-06-11 VITALS — BP 119/74 | HR 68 | Ht 60.0 in | Wt 181.2 lb

## 2019-06-11 DIAGNOSIS — R600 Localized edema: Secondary | ICD-10-CM | POA: Diagnosis not present

## 2019-06-11 DIAGNOSIS — R0789 Other chest pain: Secondary | ICD-10-CM | POA: Diagnosis not present

## 2019-06-11 MED ORDER — TORSEMIDE 20 MG PO TABS: 60.0000 mg | ORAL_TABLET | Freq: Every day | ORAL | 3 refills | Status: DC

## 2019-06-11 NOTE — Progress Notes (Signed)
Clinical Summary Ms. Burhop is a 59 y.o.female seen today for follow up of the following medical problems.   1. Chest pain CAD risk factors: HTN, diet controlled DM2 previously on insulin, +tobacco x 25 years. Mother "heart troubles" in her mid 48s, multiple siblings with heart troubles at early age. - echo 10/2016 LVEF 60-65%.  - no showed for lexiscan 01/2017 -09/2018 echo LVEF 60-65%, normal RV function.   - was to have repeat stress test in 09/2018. Episode of syncope and SOB with wearing a facemask, seen in ER without evidence of ACS. She refused to reschedule test - last visit with PA [facility] emperic trial of imdur - headaches on imdur, she stopped taking. We changed to norvasc 2.5mg  daily    - no recent chest pain,symptoms resolved with restarting lopressor.   2. LE edema 09/2018 echo LVEF 60-65%, normal RV function. - reports worsening edema - we had her on torsemide. At some point she was changed to [facility] by another provider Im not sure the indication - weight up to 180 lbs, she was 166 lbs back in 12/2018  - taking torsemide 40mg  daily. Ongoing LE edema, weights unchanged at 181 lbs.    3. Chronic left shoulder pain - followed by pcp  4. Chronic neck/back pain - followed by pcp - prior surgeries  5. 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 Leg wounds - followed by wound care Past Medical History:  Diagnosis Date  . Back pain   . Depression   . Fibromyalgia   . GERD (gastroesophageal reflux disease)   . Hypertension    diet control  . Hypothyroid   . IBS (irritable bowel syndrome)   . Migraines   . Obesity   . Osteoarthritis   . Sleep apnea      Allergies  Allergen Reactions  . Cephalexin Hives    unknown  . Latex Rash  . Betadine [Povidone Iodine] Rash  . Cleocin [Clindamycin Hcl] Hives  . Other     Band aides - rash   . Sulfa Antibiotics     Vomiting   . Toradol [Ketorolac Tromethamine] Nausea And Vomiting      migraine     Current Outpatient Medications  Medication Sig Dispense Refill  . amLODipine (NORVASC) 2.5 MG tablet TAKE 1 TABLET BY MOUTH EVERY DAY 90 tablet 1  . beta carotene w/minerals (OCUVITE) tablet Take 1 tablet by mouth daily.      . diclofenac Sodium (VOLTAREN) 1 % GEL apply FOUR grams TO affected joints AS NEEDED FOUR TIMES DAILY (Patient not taking: Reported on 05/27/2019) 500 g 5  . Docusate Sodium 100 MG capsule Take 100 mg by mouth 2 (two) times daily.    . DULoxetine (CYMBALTA) 20 MG capsule Take 20 mg by mouth 2 (two) times daily.    Marland Kitchen esomeprazole (NEXIUM) 40 MG capsule Take 1 capsule (40 mg total) by mouth daily before breakfast.    . furosemide (LASIX) 40 MG tablet TAKE 1 TABLET TODAY AND TOMORROW (Patient taking differently: Take 40 mg by mouth daily. TAKE 1 TABLET TODAY AND TOMORROW) 2 tablet 0  . hydrOXYzine (VISTARIL) 25 MG capsule Take 25 mg by mouth every 6 (six) hours as needed for anxiety.   5  . levothyroxine (SYNTHROID) 125 MCG tablet Take 125 mcg by mouth daily.    Marland Kitchen levothyroxine (SYNTHROID) 150 MCG tablet Take 125 mcg by mouth daily before breakfast.     . linaclotide (LINZESS) 290  MCG CAPS capsule Take 290 mcg by mouth daily before breakfast.    . metoprolol tartrate (LOPRESSOR) 25 MG tablet Take 1 tablet (25 mg total) by mouth 2 (two) times daily. 60 tablet 6  . Multiple Vitamins-Iron (MULTIVITAMINS WITH IRON) TABS Take 1 tablet by mouth daily.    . Omega-3 Fatty Acids (FISH OIL) 1000 MG CAPS TAKE TWO CAPSULES BY MOUTH TWICE DAILY    . oxyCODONE (ROXICODONE) 15 MG immediate release tablet TAKE 1 TABLET BY MOUTH EVERY FIVE HOURS AS NEEDED FOR PAIN (Patient not taking: Reported on 05/27/2019) 140 tablet 0  . polyethylene glycol (MIRALAX / GLYCOLAX) packet Take 17 g by mouth 2 (two) times daily before a meal.      . potassium chloride (MICRO-K) 10 MEQ CR capsule Take 20 mEq by mouth daily.    . pregabalin (LYRICA) 100 MG capsule Take 100 mg by mouth 3 (three)  times daily.     . Prucalopride Succinate (MOTEGRITY) 2 MG TABS Take 2 mg by mouth daily. 30 tablet 5  . silver sulfADIAZINE (SSD) 1 % cream APPLY TO THE AFFECTED AREA(S) TWICE DAILY UNTIL HEALED    . spironolactone (ALDACTONE) 25 MG tablet Take 0.5 tablets (12.5 mg total) by mouth daily. 15 tablet 6  . sucralfate (CARAFATE) 1 g tablet Take 1 g by mouth 4 (four) times daily.    . SUMAtriptan (IMITREX) 100 MG tablet TAKE 1 TABLET BY MOUTH AT ONSET OF HEADACHE. MAY REPEAT ONCE IN TWO HOURS. NO MORE THAN TWO TABLETS IN 24 HOURS  1  . topiramate (TOPAMAX) 100 MG tablet Take 50 mg by mouth 2 (two) times daily.     Marland Kitchen torsemide (DEMADEX) 20 MG tablet Take 2 tablets (40 mg total) by mouth daily. 60 tablet 6  . traZODone (DESYREL) 50 MG tablet Take 50 mg by mouth at bedtime.     . VENTOLIN HFA 108 (90 Base) MCG/ACT inhaler Inhale 1-2 puffs into the lungs every 6 (six) hours as needed for wheezing or shortness of breath.     . Zolpidem Tartrate (AMBIEN PO) Take 5 mg by mouth daily.      No current facility-administered medications for this visit.     Past Surgical History:  Procedure Laterality Date  . AMPUTATION  12/13/2011   Procedure: AMPUTATION DIGIT;  Surgeon: Marcheta Grammes, DPM;  Location: [facility];  Service: Orthopedics;  Laterality: Right;  amputation second toe right foot  . BACK SURGERY    . BIOPSY  12/23/2015   Procedure: BIOPSY;  Surgeon: Rogene Houston, MD;  [facility];  Service: Endoscopy;;  pre pyloric patches  . BREAST BIOPSY    . CHOLECYSTECTOMY    . CYSTOSCOPY W/ URETERAL STENT PLACEMENT Left 10/09/2017   Procedure: CYSTOSCOPY WITH RETROGRADE PYELOGRAM/URETERAL STENT PLACEMENT;  Surgeon: Cleon Gustin, MD;  Location: [facility];  Service: Urology;  Laterality: Left;  . CYSTOSCOPY WITH RETROGRADE PYELOGRAM, URETEROSCOPY AND STENT PLACEMENT Left 10/23/2017   Procedure: CYSTOSCOPY WITH LEFT RETROGRADE PYELOGRAM, LEFT URETEROSCOPY AND STENT EXCHANGE;  Surgeon:  Cleon Gustin, MD;  Location: [facility];  Service: Urology;  Laterality: Left;  . ESOPHAGOGASTRODUODENOSCOPY  05/28/2011   Procedure: ESOPHAGOGASTRODUODENOSCOPY (EGD);  Surgeon: Rogene Houston, MD;  [facility];  Service: Endoscopy;  Laterality: N/A;  730  . ESOPHAGOGASTRODUODENOSCOPY N/A 10/09/2013   Procedure: ESOPHAGOGASTRODUODENOSCOPY (EGD);  Surgeon: Rogene Houston, MD;  [facility];  Service: Endoscopy;  Laterality: N/A;  730  . ESOPHAGOGASTRODUODENOSCOPY (  EGD) WITH PROPOFOL N/A 12/23/2015   Procedure: ESOPHAGOGASTRODUODENOSCOPY (EGD) WITH PROPOFOL;  Surgeon: Rogene Houston, MD;  [facility];  Service: Endoscopy;  Laterality: N/A;  . HERNIA REPAIR    . HOLMIUM LASER APPLICATION Left [id number]   Procedure: LEFT URETEROSCOPY WITH HOLMIUM LASER LITHOTRIPSY;  Surgeon: Cleon Gustin, MD;  Location: [facility];  Service: Urology;  Laterality: Left;  Marland Kitchen MALONEY DILATION N/A 10/09/2013   Procedure: MALONEY DILATION;  Surgeon: Rogene Houston, MD;  [facility];  Service: Endoscopy;  Laterality: N/A;  . NASAL SEPTUM SURGERY    . [place], 2008   2 yrs ago  . TOTAL ABDOMINAL HYSTERECTOMY     Left oophroectomy for a lare tumor about 21 yrs. RT ovary removed time of hysterectomy     Allergies  Allergen Reactions  . Cephalexin Hives    unknown  . Latex Rash  . Betadine [Povidone Iodine] Rash  . Cleocin [Clindamycin Hcl] Hives  . Other     Band aides - rash   . Sulfa Antibiotics     Vomiting   . Toradol [Ketorolac Tromethamine] Nausea And Vomiting    migraine      Family History  Problem Relation Age of Onset  . CAD Mother   . Cancer Mother   . Obesity Brother   . Cancer Sister   . Liver disease Brother   . Hypertension Brother      Social History Ms. Credit reports that she has been smoking cigarettes. She has a 8.75 pack-year smoking history. She has never used smokeless tobacco. Ms. Jasin reports no history of  alcohol use.   Review of Systems CONSTITUTIONAL: No weight loss, fever, chills, weakness or fatigue.  HEENT: Eyes: No visual loss, blurred vision, double vision or yellow sclerae.No hearing loss, sneezing, congestion, runny nose or sore throat.  SKIN: No rash or itching.  CARDIOVASCULAR: per hpi RESPIRATORY: No shortness of breath, cough or sputum.  GASTROINTESTINAL: No anorexia, nausea, vomiting or diarrhea. No abdominal pain or blood.  GENITOURINARY: No burning on urination, no polyuria NEUROLOGICAL: No headache, dizziness, syncope, paralysis, ataxia, numbness or tingling in the extremities. No change in bowel or bladder control.  MUSCULOSKELETAL: No muscle, back pain, joint pain or stiffness.  LYMPHATICS: No enlarged nodes. No history of splenectomy.  PSYCHIATRIC: No history of depression or anxiety.  ENDOCRINOLOGIC: No reports of sweating, cold or heat intolerance. No polyuria or polydipsia.  Marland Kitchen   Physical Examination Today's Vitals   06/11/19 1316  BP: 119/74  Pulse: 68  SpO2: 93%  Weight: 181 lb 3.2 oz (82.2 kg)  Height: 5' (1.524 m)   Body mass index is 35.39 kg/m.  Gen: resting comfortably, no acute distress HEENT: no scleral icterus, pupils equal round and reactive, no palptable cervical adenopathy,  CV: RRR, no m/r/g, no jvd Resp: Clear to auscultation bilaterally GI: abdomen is soft, non-tender, non-distended, normal bowel sounds, no hepatosplenomegaly MSK: extremities are warm, 2+ bilateral LE edema Skin: warm, no rash Neuro:  no focal deficits Psych: appropriate affect   Diagnostic Studies 10/2016 echo Study Conclusions  - Left ventricle: The cavity size was normal. Wall thickness was normal. Systolic function was normal. The estimated ejection fraction was in the range of 60% to 65%. Wall motion was normal; there were no regional wall motion abnormalities. Left ventricular diastolic function parameters were normal. - Mitral valve: There was  trivial regurgitation. - Right atrium: Central venous pressure (est): 8 mm Hg. -  Atrial septum: No defect or patent foramen ovale was identified. - Tricuspid valve: There was trivial regurgitation. - Pulmonary arteries: PA peak pressure: 15 mm Hg (S). - Pericardium, extracardiac: There was no pericardial effusion.  Impressions:  - Normal LV wall thickness with LVEF 60-65% and normal diastolic function. Trivial mitral and tricuspid regurgitation. Normal estimated PASP 15 mmHg.  09/2018 echo IMPRESSIONS   1. The left ventricle has normal systolic function with an ejection fraction of 60-65%. The cavity size was normal. Left ventricular diastolic parameters were normal. No evidence of left ventricular regional wall motion abnormalities. 2. The right ventricle has normal systolic function. The cavity was normal. There is mildly increased right ventricular wall thickness. 3. Left atrial size was mildly dilated. 4. The mitral valve is grossly normal. There is mild mitral annular calcification present. 5. The tricuspid valve is grossly normal. 6. The aortic valve is grossly normal. 7. The aortic root is normal in size and structure. 8. The inferior vena cava was dilated in size with <50% respiratory variability.    Assessment and Plan  1. Chest pain - chronic somewhat atypical chest pain -symptoms have resolved back on lopressor, continue to monitor.  - EKG today shows SR, no acute ischemic changes  2. LE edema - weight 01/2019 166 lbs, up to 181 lbs today. Not trending down since changing to torsemide - increase torsemide to 60mg  daily.    F/u 3 weeks virtual visit, order labs af that time.       [name], M.D

## 2019-06-11 NOTE — Patient Instructions (Signed)
Your physician recommends that you schedule a follow-up appointment in: [place] has recommended you make the following change in your medication:   INCREASE TORSEMIDE 60 MG DAILY   Thank you for choosing [facility]!!

## 2019-06-17 ENCOUNTER — Telehealth: Payer: Self-pay | Admitting: Orthopaedic Surgery

## 2019-06-17 MED ORDER — OXYCODONE HCL 15 MG PO TABS: ORAL_TABLET | ORAL | 0 refills | Status: DC

## 2019-06-17 NOTE — Telephone Encounter (Signed)
Patient requests refill on Oxycodone 15 mgs.  Qty  140  Sig: TAKE 1 TABLET BY MOUTH EVERY FIVE HOURS AS NEEDED FOR PAIN  Patient uses [facility]

## 2019-07-01 ENCOUNTER — Other Ambulatory Visit: Payer: Self-pay

## 2019-07-01 ENCOUNTER — Encounter: Payer: Self-pay | Admitting: Orthopaedic Surgery

## 2019-07-01 ENCOUNTER — Ambulatory Visit (INDEPENDENT_AMBULATORY_CARE_PROVIDER_SITE_OTHER): Payer: Medicare Other | Admitting: Orthopaedic Surgery

## 2019-07-01 DIAGNOSIS — M25512 Pain in left shoulder: Secondary | ICD-10-CM

## 2019-07-01 DIAGNOSIS — F17210 Nicotine dependence, cigarettes, uncomplicated: Secondary | ICD-10-CM

## 2019-07-01 DIAGNOSIS — G8929 Other chronic pain: Secondary | ICD-10-CM

## 2019-07-01 DIAGNOSIS — G894 Chronic pain syndrome: Secondary | ICD-10-CM

## 2019-07-01 NOTE — Patient Instructions (Signed)

## 2019-07-01 NOTE — Progress Notes (Signed)
PROCEDURE NOTE:  The patient request injection, verbal consent was obtained.  The left shoulder was prepped appropriately after time out was performed.   Sterile technique was observed and injection of 1 cc of Depo-Medrol 40 mg with several cc's of plain xylocaine. Anesthesia was provided by ethyl chloride and a 20-gauge needle was used to inject the shoulder area. A posterior approach was used.  The injection was tolerated well.  A band aid dressing was applied.  The patient was advised to apply ice later today and tomorrow to the injection sight as needed.  Return in one month.

## 2019-07-02 ENCOUNTER — Telehealth (INDEPENDENT_AMBULATORY_CARE_PROVIDER_SITE_OTHER): Payer: Medicare Other | Admitting: Cardiology

## 2019-07-02 ENCOUNTER — Encounter: Payer: Self-pay | Admitting: Cardiology

## 2019-07-02 VITALS — Ht 60.0 in | Wt 180.0 lb

## 2019-07-02 DIAGNOSIS — R600 Localized edema: Secondary | ICD-10-CM | POA: Diagnosis not present

## 2019-07-02 MED ORDER — TORSEMIDE 20 MG PO TABS: 80.0000 mg | ORAL_TABLET | Freq: Every day | ORAL | 3 refills | Status: DC

## 2019-07-02 NOTE — Addendum Note (Signed)
Addended by: Julian Hy T on: 07/02/2019 08:55 AM   Modules accepted: Orders

## 2019-07-02 NOTE — Patient Instructions (Signed)
Your physician recommends that you schedule a follow-up appointment in: [place] physician has recommended you make the following change in your medication:   INCREASE TORSEMIDE 80 MG (4 TABLETS) DAILY   Your physician recommends that you return for lab work in: [place] BMP/MG  Thank you for choosing [facility]!!

## 2019-07-02 NOTE — Progress Notes (Signed)
Virtual Visit via Telephone Note   This visit type was conducted due to national recommendations for restrictions regarding the COVID-19 Pandemic (e.g. social distancing) in an effort to limit this patient's exposure and mitigate transmission in our community.  Due to her co-morbid illnesses, this patient is at least at moderate risk for complications without adequate follow up.  This format is felt to be most appropriate for this patient at this time.  The patient did not have access to video technology/had technical difficulties with video requiring transitioning to audio format only (telephone).  All issues noted in this document were discussed and addressed.  No physical exam could be performed with this format.  Please refer to the patient's chart for her  consent to telehealth for [facility].   The patient was identified using 2 identifiers.  Date:  07/02/2019   ID:  [name], DOB August 31, 1960, MRN [id number]  Patient Location: Home Provider Location: Office  PCP:  Neale Burly, MD  Cardiologist:  Carlyle Dolly, MD  Electrophysiologist:  None   Evaluation Performed:  Follow-Up Visit  Chief Complaint:  Follow up  History of Present Illness:    [name] is a 58 y.o. female seen today for follow up of the following medical problems. This is a focused visit for recent weight gain and ongoing edema.     1. LE edema 09/2018 echo LVEF 60-65%, normal RV function. - reports worsening edema - we had her on torsemide. At some point she was changed to [facility] by another provider Im not sure the indication - weight up to 180 lbs, she was 166 lbs back in 12/2018  - taking torsemide 40mg  daily. Ongoing LE edema, weights unchanged at 181 lbs.    - last visit we increased torsemdie to 60mg  daily. - weight unchanged essentially, ranging between 176-180 lbs.       The patient does not have symptoms concerning for COVID-19 infection (fever, chills, cough, or  new shortness of breath).    Past Medical History:  Diagnosis Date  . Back pain   . Depression   . Fibromyalgia   . GERD (gastroesophageal reflux disease)   . Hypertension    diet control  . Hypothyroid   . IBS (irritable bowel syndrome)   . Migraines   . Obesity   . Osteoarthritis   . Sleep apnea    Past Surgical History:  Procedure Laterality Date  . AMPUTATION  12/13/2011   Procedure: AMPUTATION DIGIT;  Surgeon: Marcheta Grammes, DPM;  Location: [facility];  Service: Orthopedics;  Laterality: Right;  amputation second toe right foot  . BACK SURGERY    . BIOPSY  12/23/2015   Procedure: BIOPSY;  Surgeon: Rogene Houston, MD;  [facility];  Service: Endoscopy;;  pre pyloric patches  . BREAST BIOPSY    . CHOLECYSTECTOMY    . CYSTOSCOPY W/ URETERAL STENT PLACEMENT Left 10/09/2017   Procedure: CYSTOSCOPY WITH RETROGRADE PYELOGRAM/URETERAL STENT PLACEMENT;  Surgeon: Cleon Gustin, MD;  Location: [facility];  Service: Urology;  Laterality: Left;  . CYSTOSCOPY WITH RETROGRADE PYELOGRAM, URETEROSCOPY AND STENT PLACEMENT Left 10/23/2017   Procedure: CYSTOSCOPY WITH LEFT RETROGRADE PYELOGRAM, LEFT URETEROSCOPY AND STENT EXCHANGE;  Surgeon: Cleon Gustin, MD;  Location: [facility];  Service: Urology;  Laterality: Left;  . ESOPHAGOGASTRODUODENOSCOPY  05/28/2011   Procedure: ESOPHAGOGASTRODUODENOSCOPY (EGD);  Surgeon: Rogene Houston, MD;  [facility];  Service: Endoscopy;  Laterality: N/A;  730  .  ESOPHAGOGASTRODUODENOSCOPY N/A 10/09/2013   Procedure: ESOPHAGOGASTRODUODENOSCOPY (EGD);  Surgeon: Rogene Houston, MD;  [facility];  Service: Endoscopy;  Laterality: N/A;  730  . ESOPHAGOGASTRODUODENOSCOPY (EGD) WITH PROPOFOL N/A 12/23/2015   Procedure: ESOPHAGOGASTRODUODENOSCOPY (EGD) WITH PROPOFOL;  Surgeon: Rogene Houston, MD;  [facility];  Service: Endoscopy;  Laterality: N/A;  . HERNIA REPAIR    . HOLMIUM LASER APPLICATION Left [id number]    Procedure: LEFT URETEROSCOPY WITH HOLMIUM LASER LITHOTRIPSY;  Surgeon: Cleon Gustin, MD;  Location: [facility];  Service: Urology;  Laterality: Left;  Marland Kitchen MALONEY DILATION N/A 10/09/2013   Procedure: MALONEY DILATION;  Surgeon: Rogene Houston, MD;  [facility];  Service: Endoscopy;  Laterality: N/A;  . NASAL SEPTUM SURGERY    . [place], 2008   2 yrs ago  . TOTAL ABDOMINAL HYSTERECTOMY     Left oophroectomy for a lare tumor about 21 yrs. RT ovary removed time of hysterectomy     No outpatient medications have been marked as taking for the 07/02/19 encounter (Appointment) with [name], MD.     Allergies:   Cephalexin, Latex, Betadine [povidone iodine], Cleocin [clindamycin hcl], Other, Sulfa antibiotics, and Toradol [ketorolac tromethamine]   Social History   Tobacco Use  . Smoking status: Current Every Day Smoker    Packs/day: 0.25    Years: 35.00    Pack years: 8.75    Types: Cigarettes  . Smokeless tobacco: Never Used  . Tobacco comment: 2/16 - 4 ciggs per day   Substance Use Topics  . Alcohol use: No  . Drug use: No     Family Hx: The patient's family history includes CAD in her mother; Cancer in her mother and sister; Hypertension in her brother; Liver disease in her brother; Obesity in her brother.  ROS:   Please see the history of present illness.     All other systems reviewed and are negative.   Prior CV studies:   The following studies were reviewed today:  10/2016 echo Study Conclusions  - Left ventricle: The cavity size was normal. Wall thickness was normal. Systolic function was normal. The estimated ejection fraction was in the range of 60% to 65%. Wall motion was normal; there were no regional wall motion abnormalities. Left ventricular diastolic function parameters were normal. - Mitral valve: There was trivial regurgitation. - Right atrium: Central venous pressure (est): 8 mm Hg. - Atrial septum: No defect or  patent foramen ovale was identified. - Tricuspid valve: There was trivial regurgitation. - Pulmonary arteries: PA peak pressure: 15 mm Hg (S). - Pericardium, extracardiac: There was no pericardial effusion.  Impressions:  - Normal LV wall thickness with LVEF 60-65% and normal diastolic function. Trivial mitral and tricuspid regurgitation. Normal estimated PASP 15 mmHg.  09/2018 echo IMPRESSIONS   1. The left ventricle has normal systolic function with an ejection fraction of 60-65%. The cavity size was normal. Left ventricular diastolic parameters were normal. No evidence of left ventricular regional wall motion abnormalities. 2. The right ventricle has normal systolic function. The cavity was normal. There is mildly increased right ventricular wall thickness. 3. Left atrial size was mildly dilated. 4. The mitral valve is grossly normal. There is mild mitral annular calcification present. 5. The tricuspid valve is grossly normal. 6. The aortic valve is grossly normal. 7. The aortic root is normal in size and structure. 8. The inferior vena cava was dilated in size with <50% respiratory variability.  Labs/Other Tests and Data Reviewed:    EKG:  No ECG reviewed.  Recent Labs: 12/24/2018: TSH 0.811 05/20/2019: ALT 16; BUN 12; Creatinine, Ser 0.68; Hemoglobin 12.4; Platelets 178; Potassium 4.0; Sodium 140   Recent Lipid Panel Lab Results  Component Value Date/Time   CHOL  10/16/2006 01:39 AM    145        ATP III CLASSIFICATION:  <200     mg/dL   Desirable  200-239  mg/dL   Borderline High  >=240    mg/dL   High   TRIG 408 (H) 10/16/2006 01:39 AM   HDL <10 (L) 10/16/2006 01:39 AM   CHOLHDL NOT CALCULATED 10/16/2006 01:39 AM   LDLCALC  10/16/2006 01:39 AM    UNABLE TO CALCULATE IF TRIGLYCERIDE OVER 400 mg/dL        Total Cholesterol/HDL:CHD Risk Coronary Heart Disease Risk Table                     Men   Women  1/2 Average Risk   3.4   3.3    Wt  Readings from Last 3 Encounters:  06/11/19 181 lb 3.2 oz (82.2 kg)  05/19/19 180 lb (81.6 kg)  01/12/19 166 lb (75.3 kg)     Objective:    Vital Signs:   Today's Vitals   07/02/19 0825  Weight: 180 lb (81.6 kg)  Height: 5' (1.524 m)   Body mass index is 35.15 kg/m. Normal affect. Normal speech pattern and tone. Comfortable, no apparent distress. No audible signs of sob or wheezing.   ASSESSMENT & PLAN:    1. Leg edema - ongoing, no significant improvement on torsemide 60mg , will increase to 80mg  daily - bmet/mg in 1 week   F/u3 weeks virtual visit   COVID-19 Education: The signs and symptoms of COVID-19 were discussed with the patient and how to seek care for testing (follow up with PCP or arrange E-visit).  The importance of social distancing was discussed today.  Time:   Today, I have spent 14 minutes with the patient with telehealth technology discussing the above problems.     Medication Adjustments/Labs and Tests Ordered: Current medicines are reviewed at length with the patient today.  Concerns regarding medicines are outlined above.   Tests Ordered: No orders of the defined types were placed in this encounter.   Medication Changes: No orders of the defined types were placed in this encounter.   Follow Up:  Virtual Visit  in 3 week(s)  Signed, Carlyle Dolly, MD  07/02/2019 8:11 AM    [place]

## 2019-07-03 IMAGING — CR PORTABLE CHEST - 1 VIEW
1 series · 2 of 2 positions shown · non-contrast
Comparison: Radiographs July 01, 2018.

CLINICAL DATA: Syncope, fall.

EXAM:
PORTABLE CHEST 1 VIEW

[Series 1: portable · 0.17mm/px · 2 of 2 slices shown]
[im 1/2]
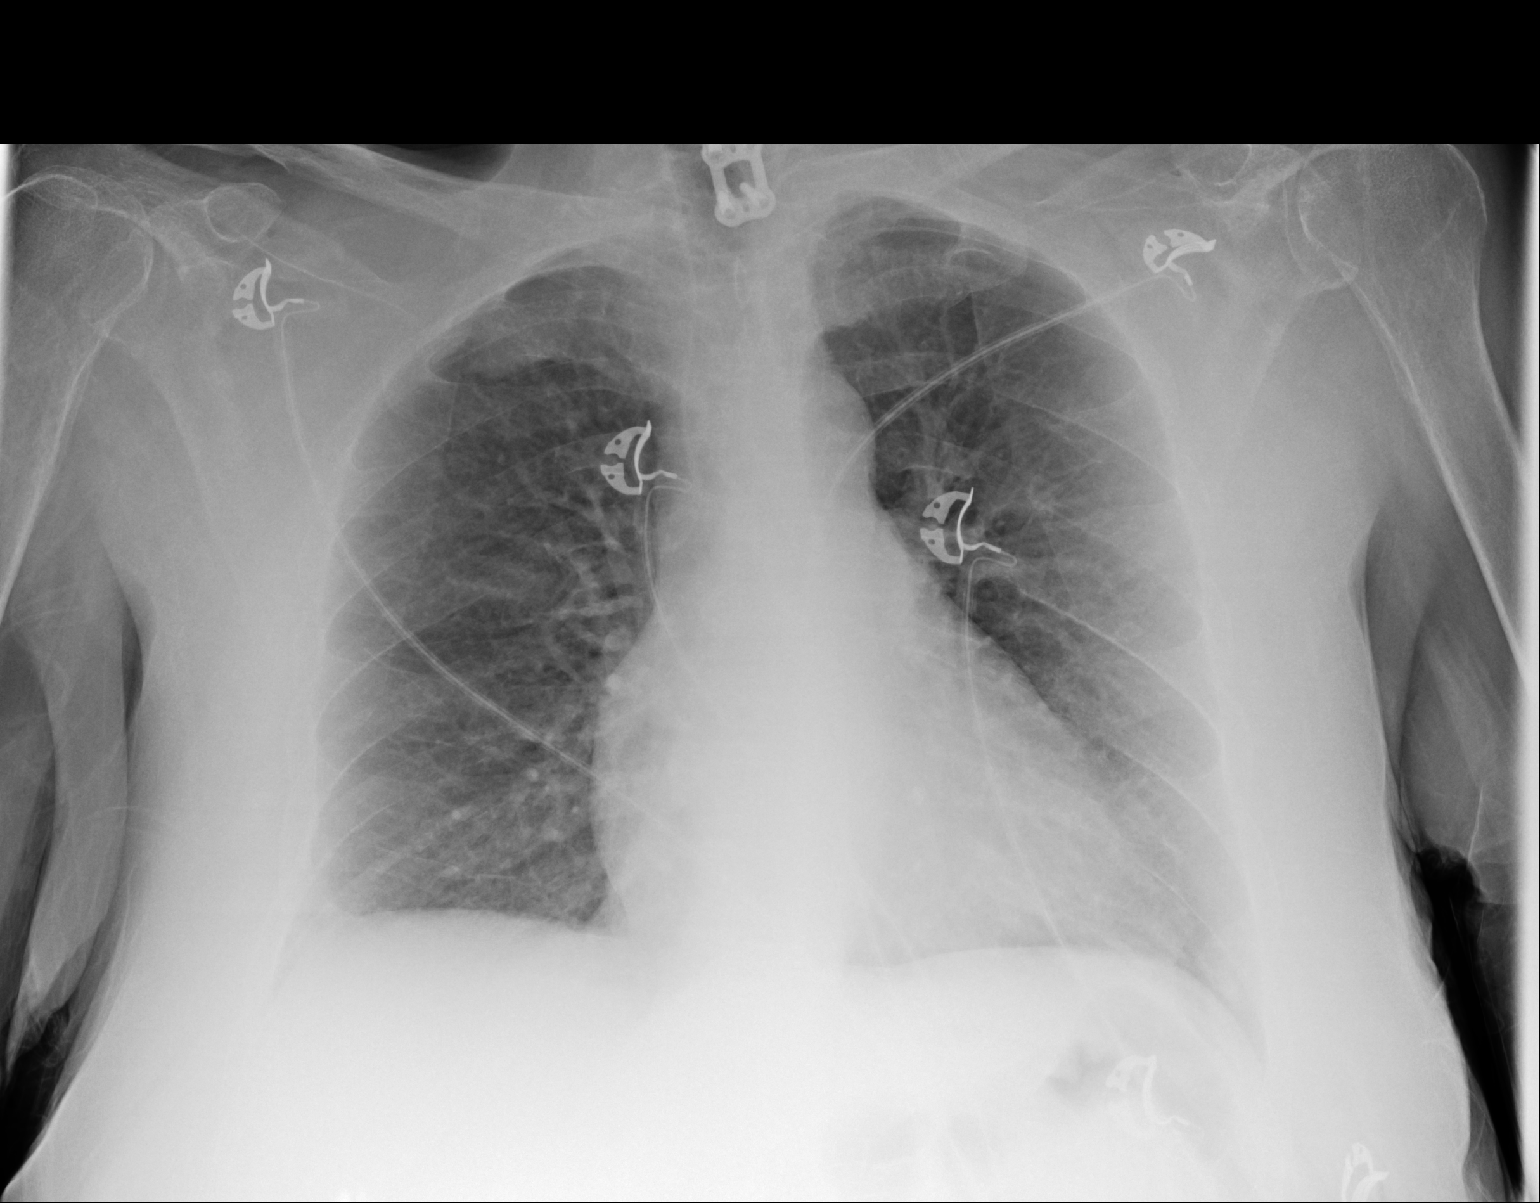
[im 2/2]
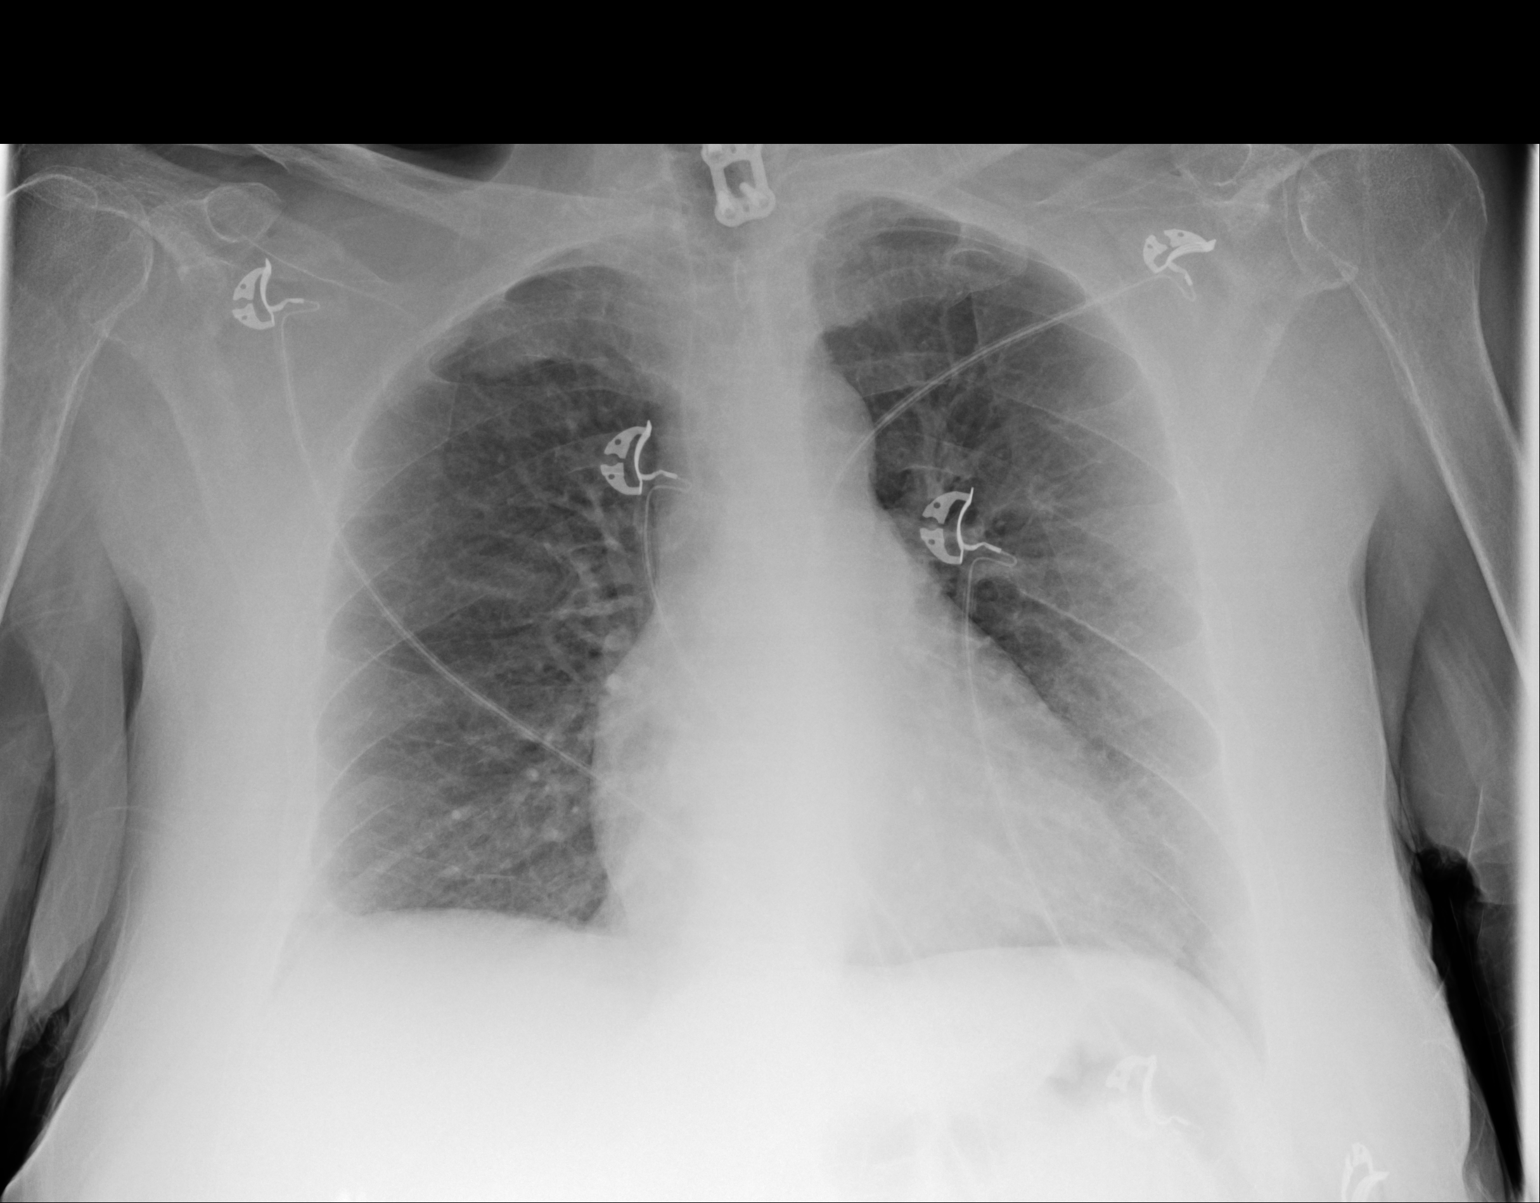

[2 of 2 positions shown; findings below may reference images not displayed]

FINDINGS: The heart size and mediastinal contours are within normal limits.
Both lungs are clear. The visualized skeletal structures are
unremarkable.
IMPRESSION: No active disease.

## 2019-07-15 ENCOUNTER — Telehealth: Payer: Self-pay | Admitting: Orthopaedic Surgery

## 2019-07-15 NOTE — Telephone Encounter (Signed)
Patient requests refill on Oxycodone 15 mgs.  Qty  140  Sig: TAKE 1 TABLET BY MOUTH EVERY FIVE HOURS AS NEEDED FOR PAIN  Patient uses [name] in [place]

## 2019-07-16 MED ORDER — OXYCODONE HCL 15 MG PO TABS: ORAL_TABLET | ORAL | 0 refills | Status: DC

## 2019-07-29 ENCOUNTER — Encounter: Payer: Self-pay | Admitting: Orthopaedic Surgery

## 2019-07-29 ENCOUNTER — Other Ambulatory Visit: Payer: Self-pay

## 2019-07-29 ENCOUNTER — Ambulatory Visit (INDEPENDENT_AMBULATORY_CARE_PROVIDER_SITE_OTHER): Payer: Medicare Other | Admitting: Orthopaedic Surgery

## 2019-07-29 DIAGNOSIS — G8929 Other chronic pain: Secondary | ICD-10-CM | POA: Diagnosis not present

## 2019-07-29 DIAGNOSIS — F17210 Nicotine dependence, cigarettes, uncomplicated: Secondary | ICD-10-CM

## 2019-07-29 DIAGNOSIS — M25512 Pain in left shoulder: Secondary | ICD-10-CM

## 2019-07-29 DIAGNOSIS — G894 Chronic pain syndrome: Secondary | ICD-10-CM

## 2019-07-29 NOTE — Progress Notes (Signed)
I have rash all over  She had a Reclast injection last week and had a reaction to it.  She has a rash all over her body.  I cannot give injection for shoulder today.  I will reschedule for two weeks.  She has no breathing problems.  She has had nurses check her in her house every few days since the other injection.  Encounter Diagnoses  Name Primary?  . Chronic left shoulder pain Yes  . Pain syndrome, chronic   . Cigarette nicotine dependence without complication    Return in two weeks.  Call if any problem.  Precautions discussed.   Electronically Signed Sanjuana Kava, MD 4/28/202110:55 AM

## 2019-07-29 NOTE — Patient Instructions (Signed)

## 2019-08-03 ENCOUNTER — Telehealth (INDEPENDENT_AMBULATORY_CARE_PROVIDER_SITE_OTHER): Payer: Medicare Other | Admitting: Cardiology

## 2019-08-03 ENCOUNTER — Encounter: Payer: Self-pay | Admitting: Cardiology

## 2019-08-03 ENCOUNTER — Encounter: Payer: Self-pay | Admitting: *Deleted

## 2019-08-03 VITALS — Ht 60.0 in | Wt 182.0 lb

## 2019-08-03 DIAGNOSIS — R600 Localized edema: Secondary | ICD-10-CM | POA: Diagnosis not present

## 2019-08-03 NOTE — Progress Notes (Signed)
Virtual Visit via Telephone Note   This visit type was conducted due to national recommendations for restrictions regarding the COVID-19 Pandemic (e.g. social distancing) in an effort to limit this patient's exposure and mitigate transmission in our community.  Due to her co-morbid illnesses, this patient is at least at moderate risk for complications without adequate follow up.  This format is felt to be most appropriate for this patient at this time.  The patient did not have access to video technology/had technical difficulties with video requiring transitioning to audio format only (telephone).  All issues noted in this document were discussed and addressed.  No physical exam could be performed with this format.  Please refer to the patient's chart for her  consent to telehealth for [facility].   The patient was identified using 2 identifiers.  Date:  08/03/2019   ID:  [name], DOB [birth year], MRN [id number]  Patient Location: Home Provider Location: Office  PCP:  Neale Burly, MD  Cardiologist:  Carlyle Dolly, MD  Electrophysiologist:  None   Evaluation Performed:  Follow-Up Visit  Chief Complaint:  Follow up  History of Present Illness:    [name] is a 59 y.o. female seen today for a follow up visit for the following medical problems. This is a focused visit on recent issues with leg edema.   1. LE edema 09/2018 echo LVEF 60-65%, normal RV function. - reports worsening edema - we had her on torsemide. At some point she was changed to [facility] by another provider Im not sure the indication - weight up to 180 lbs, she was 166 lbs back in 12/2018 - last visit we increased torsemide to 80mg  daily -swelling is up and down.  - taking torsemide 80mg  daily.  - weights remain in the low 180s - she believes she had blood work at the [facility] center recently.     The patient does not have symptoms concerning for COVID-19 infection (fever, chills,  cough, or new shortness of breath).    Past Medical History:  Diagnosis Date  . Back pain   . Depression   . Fibromyalgia   . GERD (gastroesophageal reflux disease)   . Hypertension    diet control  . Hypothyroid   . IBS (irritable bowel syndrome)   . Migraines   . Obesity   . Osteoarthritis   . Sleep apnea    Past Surgical History:  Procedure Laterality Date  . AMPUTATION  12/13/2011   Procedure: AMPUTATION DIGIT;  Surgeon: Marcheta Grammes, DPM;  Location: [facility] OR[facility];  Service: Orthopedics;  Laterality: Right;  amputation second toe right foot  . BACK SURGERY    . BIOPSY  12/23/2015   Procedure: BIOPSY;  Surgeon: Rogene Houston, MD;  Location: [facility];  Service: Endoscopy;;  pre pyloric patches  . BREAST BIOPSY    . CHOLECYSTECTOMY    . CYSTOSCOPY W/ URETERAL STENT PLACEMENT Left 10/09/2017   Procedure: CYSTOSCOPY WITH RETROGRADE PYELOGRAM/URETERAL STENT PLACEMENT;  Surgeon: Cleon Gustin, MD;  Location: [facility];  Service: Urology;  Laterality: Left;  . CYSTOSCOPY WITH RETROGRADE PYELOGRAM, URETEROSCOPY AND STENT PLACEMENT Left 10/23/2017   Procedure: CYSTOSCOPY WITH LEFT RETROGRADE PYELOGRAM, LEFT URETEROSCOPY AND STENT EXCHANGE;  Surgeon: Cleon Gustin, MD;  Location: [facility];  Service: Urology;  Laterality: Left;  . ESOPHAGOGASTRODUODENOSCOPY  05/28/2011   Procedure: ESOPHAGOGASTRODUODENOSCOPY (EGD);  Surgeon: Rogene Houston, MD;  Location: [facility];  Service: Endoscopy;  Laterality:  N/A;  730  . ESOPHAGOGASTRODUODENOSCOPY N/A 10/09/2013   Procedure: ESOPHAGOGASTRODUODENOSCOPY (EGD);  Surgeon: Rogene Houston, MD;  [facility];  Service: Endoscopy;  Laterality: N/A;  730  . ESOPHAGOGASTRODUODENOSCOPY (EGD) WITH PROPOFOL N/A 12/23/2015   Procedure: ESOPHAGOGASTRODUODENOSCOPY (EGD) WITH PROPOFOL;  Surgeon: Rogene Houston, MD;  [facility];  Service: Endoscopy;  Laterality: N/A;  . HERNIA REPAIR    . HOLMIUM LASER APPLICATION Left  [id number]   Procedure: LEFT URETEROSCOPY WITH HOLMIUM LASER LITHOTRIPSY;  Surgeon: Cleon Gustin, MD;  Location: [facility];  Service: Urology;  Laterality: Left;  Marland Kitchen MALONEY DILATION N/A 10/09/2013   Procedure: MALONEY DILATION;  Surgeon: Rogene Houston, MD;  Location[facility];  Service: Endoscopy;  Laterality: N/A;  . NASAL SEPTUM SURGERY    . [place], 2008   2 yrs ago  . TOTAL ABDOMINAL HYSTERECTOMY     Left oophroectomy for a lare tumor about 21 yrs. RT ovary removed time of hysterectomy     No outpatient medications have been marked as taking for the 08/03/19 encounter (Appointment) with [name], MD.     Allergies:   Cephalexin, Latex, Betadine [povidone iodine], Cleocin [clindamycin hcl], Other, Sulfa antibiotics, and Toradol [ketorolac tromethamine]   Social History   Tobacco Use  . Smoking status: Current Every Day Smoker    Packs/day: 0.25    Years: 35.00    Pack years: 8.75    Types: Cigarettes  . Smokeless tobacco: Never Used  . Tobacco comment: 2/16 - 4 ciggs per day   Substance Use Topics  . Alcohol use: No  . Drug use: No     Family Hx: The patient's family history includes CAD in her mother; Cancer in her mother and sister; Hypertension in her brother; Liver disease in her brother; Obesity in her brother.  ROS:   Please see the history of present illness.     All other systems reviewed and are negative.   Prior CV studies:   The following studies were reviewed today:  10/2016 echo Study Conclusions  - Left ventricle: The cavity size was normal. Wall thickness was normal. Systolic function was normal. The estimated ejection fraction was in the range of 60% to 65%. Wall motion was normal; there were no regional wall motion abnormalities. Left ventricular diastolic function parameters were normal. - Mitral valve: There was trivial regurgitation. - Right atrium: Central venous pressure (est): 8 mm Hg. - Atrial septum:  No defect or patent foramen ovale was identified. - Tricuspid valve: There was trivial regurgitation. - Pulmonary arteries: PA peak pressure: 15 mm Hg (S). - Pericardium, extracardiac: There was no pericardial effusion.  Impressions:  - Normal LV wall thickness with LVEF 60-65% and normal diastolic function. Trivial mitral and tricuspid regurgitation. Normal estimated PASP 15 mmHg.  09/2018 echo IMPRESSIONS   1. The left ventricle has normal systolic function with an ejection fraction of 60-65%. The cavity size was normal. Left ventricular diastolic parameters were normal. No evidence of left ventricular regional wall motion abnormalities. 2. The right ventricle has normal systolic function. The cavity was normal. There is mildly increased right ventricular wall thickness. 3. Left atrial size was mildly dilated. 4. The mitral valve is grossly normal. There is mild mitral annular calcification present. 5. The tricuspid valve is grossly normal. 6. The aortic valve is grossly normal. 7. The aortic root is normal in size and structure. 8. The inferior vena cava was dilated in size  with <50% respiratory variability.  Labs/Other Tests and Data Reviewed:    EKG:  No ECG reviewed.  Recent Labs: 12/24/2018: TSH 0.811 05/20/2019: ALT 16; BUN 12; Creatinine, Ser 0.68; Hemoglobin 12.4; Platelets 178; Potassium 4.0; Sodium 140   Recent Lipid Panel Lab Results  Component Value Date/Time   CHOL  10/16/2006 01:39 AM    145        ATP III CLASSIFICATION:  <200     mg/dL   Desirable  200-239  mg/dL   Borderline High  >=240    mg/dL   High   TRIG 408 (H) 10/16/2006 01:39 AM   HDL <10 (L) 10/16/2006 01:39 AM   CHOLHDL NOT CALCULATED 10/16/2006 01:39 AM   LDLCALC  10/16/2006 01:39 AM    UNABLE TO CALCULATE IF TRIGLYCERIDE OVER 400 mg/dL        Total Cholesterol/HDL:CHD Risk Coronary Heart Disease Risk Table                     Men   Women  1/2 Average Risk   3.4   3.3     Wt Readings from Last 3 Encounters:  07/02/19 180 lb (81.6 kg)  06/11/19 181 lb 3.2 oz (82.2 kg)  05/19/19 180 lb (81.6 kg)     Objective:    Vital Signs:   Today's Vitals   08/03/19 0820  Weight: 182 lb (82.6 kg)  Height: 5' (1.524 m)   Body mass index is 35.54 kg/m. Normal affect. Normal speech pattern and tone. Comfortable, no apparent distress. No audible signs of sob or wheezing.   ASSESSMENT & PLAN:    1. Leg edema - ongoing edema, not improved on torsemide 80mg  daily - request labs from pcp, if stable then would change torsemide to 60mg  in AM and 40mg  in PM     COVID-19 Education: The signs and symptoms of COVID-19 were discussed with the patient and how to seek care for testing (follow up with PCP or arrange E-visit).  The importance of social distancing was discussed today.  Time:   Today, I have spent 15 minutes with the patient with telehealth technology discussing the above problems.     Medication Adjustments/Labs and Tests Ordered: Current medicines are reviewed at length with the patient today.  Concerns regarding medicines are outlined above.   Tests Ordered: No orders of the defined types were placed in this encounter.   Medication Changes: No orders of the defined types were placed in this encounter.   Follow Up:  Virtual Visit  in 1 month(s)  Signed, Carlyle Dolly, MD  08/03/2019 8:13 AM    [place]

## 2019-08-03 NOTE — Patient Instructions (Signed)
Your physician recommends that you schedule a follow-up appointment in: [place] physician recommends that you continue on your current medications as directed. Please refer to the Current Medication list given to you today.  Thank you for choosing [facility]!!

## 2019-08-12 ENCOUNTER — Other Ambulatory Visit: Payer: Self-pay

## 2019-08-12 ENCOUNTER — Ambulatory Visit (INDEPENDENT_AMBULATORY_CARE_PROVIDER_SITE_OTHER): Payer: Medicare Other | Admitting: Orthopaedic Surgery

## 2019-08-12 ENCOUNTER — Encounter: Payer: Self-pay | Admitting: Orthopaedic Surgery

## 2019-08-12 DIAGNOSIS — G894 Chronic pain syndrome: Secondary | ICD-10-CM

## 2019-08-12 DIAGNOSIS — G8929 Other chronic pain: Secondary | ICD-10-CM

## 2019-08-12 DIAGNOSIS — M25512 Pain in left shoulder: Secondary | ICD-10-CM | POA: Diagnosis not present

## 2019-08-12 DIAGNOSIS — F17210 Nicotine dependence, cigarettes, uncomplicated: Secondary | ICD-10-CM

## 2019-08-12 NOTE — Patient Instructions (Signed)

## 2019-08-12 NOTE — Progress Notes (Signed)
PROCEDURE NOTE:  The patient request injection, verbal consent was obtained.  The left shoulder was prepped appropriately after time out was performed.   Sterile technique was observed and injection of 1 cc of Depo-Medrol 40 mg with several cc's of plain xylocaine. Anesthesia was provided by ethyl chloride and a 20-gauge needle was used to inject the shoulder area. A posterior approach was used.  The injection was tolerated well.  A band aid dressing was applied.  The patient was advised to apply ice later today and tomorrow to the injection sight as needed.  Return in one month.  Electronically Signed Sanjuana Kava, MD 5/12/202110:49 AM

## 2019-08-13 ENCOUNTER — Telehealth: Payer: Self-pay

## 2019-08-13 NOTE — Telephone Encounter (Signed)
Oxycodone 15 mg  Qty 140 Tablets  PATIENT USES [facility]   She forgot to tell you in the office

## 2019-08-14 ENCOUNTER — Other Ambulatory Visit: Payer: Self-pay | Admitting: Orthopaedic Surgery

## 2019-08-17 MED ORDER — OXYCODONE HCL 15 MG PO TABS: ORAL_TABLET | ORAL | 0 refills | Status: DC

## 2019-09-03 ENCOUNTER — Telehealth (INDEPENDENT_AMBULATORY_CARE_PROVIDER_SITE_OTHER): Payer: Medicare Other | Admitting: Cardiology

## 2019-09-03 ENCOUNTER — Encounter: Payer: Self-pay | Admitting: *Deleted

## 2019-09-03 ENCOUNTER — Encounter: Payer: Self-pay | Admitting: Cardiology

## 2019-09-03 VITALS — Ht 60.0 in | Wt 175.0 lb

## 2019-09-03 DIAGNOSIS — R600 Localized edema: Secondary | ICD-10-CM

## 2019-09-03 NOTE — Progress Notes (Signed)
Virtual Visit via Telephone Note   This visit type was conducted due to national recommendations for restrictions regarding the COVID-19 Pandemic (e.g. social distancing) in an effort to limit this patient's exposure and mitigate transmission in our community.  Due to her co-morbid illnesses, this patient is at least at moderate risk for complications without adequate follow up.  This format is felt to be most appropriate for this patient at this time.  The patient did not have access to video technology/had technical difficulties with video requiring transitioning to audio format only (telephone).  All issues noted in this document were discussed and addressed.  No physical exam could be performed with this format.  Please refer to the patient's chart for her  consent to telehealth for [facility].   The patient was identified using 2 identifiers.  Date:  09/03/2019   ID:  [name], DOB [birth year], MRN [id number]  Patient Location: Home Provider Location: Office  PCP:  Neale Burly, MD  Cardiologist:  Carlyle Dolly, MD  Electrophysiologist:  None   Evaluation Performed:  Follow-Up Visit  Chief Complaint:  Follow up  History of Present Illness:    [name] is a 59 y.o. female seen today for follow up of the following medical problems.   1. LE edema 09/2018 echo LVEF 60-65%, normal RV function. - reports worsening edema - we had her on torsemide. At some point she was changed to [facility] by another provider Im not sure the indication - weight up to 180 lbs, she was 166 lbs back in 12/2018 - last visit we increased torsemide to 80mg  daily -swelling is up and down.  - taking torsemide 80mg  daily.  - weights remain in the low 180s - she believes she had blood work at the [facility] center recently.    - 08/03/19 visit home weight 182 lbs, today 173 lbs. - swelling is improving. REmains on torsemide 80mg  daily,. Did not go for labs we had ordered, is due  for labs at cancer center in about 2 weeks.      The patient does not have symptoms concerning for COVID-19 infection (fever, chills, cough, or new shortness of breath).    Past Medical History:  Diagnosis Date  . Back pain   . Depression   . Fibromyalgia   . GERD (gastroesophageal reflux disease)   . Hypertension    diet control  . Hypothyroid   . IBS (irritable bowel syndrome)   . Migraines   . Obesity   . Osteoarthritis   . Sleep apnea    Past Surgical History:  Procedure Laterality Date  . AMPUTATION  12/13/2011   Procedure: AMPUTATION DIGIT;  Surgeon: Marcheta Grammes, DPM;  Location: [facility];  Service: Orthopedics;  Laterality: Right;  amputation second toe right foot  . BACK SURGERY    . BIOPSY  12/23/2015   Procedure: BIOPSY;  Surgeon: Rogene Houston, MD;  Location: [facility];  Service: Endoscopy;;  pre pyloric patches  . BREAST BIOPSY    . CHOLECYSTECTOMY    . CYSTOSCOPY W/ URETERAL STENT PLACEMENT Left 10/09/2017   Procedure: CYSTOSCOPY WITH RETROGRADE PYELOGRAM/URETERAL STENT PLACEMENT;  Surgeon: Cleon Gustin, MD;  Location: [facility];  Service: Urology;  Laterality: Left;  . CYSTOSCOPY WITH RETROGRADE PYELOGRAM, URETEROSCOPY AND STENT PLACEMENT Left 10/23/2017   Procedure: CYSTOSCOPY WITH LEFT RETROGRADE PYELOGRAM, LEFT URETEROSCOPY AND STENT EXCHANGE;  Surgeon: Cleon Gustin, MD;  Location: [facility];  Service: Urology;  Laterality: Left;  . ESOPHAGOGASTRODUODENOSCOPY  05/28/2011   Procedure: ESOPHAGOGASTRODUODENOSCOPY (EGD);  Surgeon: Rogene Houston, MD;  Location[facility];  Service: Endoscopy;  Laterality: N/A;  730  . ESOPHAGOGASTRODUODENOSCOPY N/A 10/09/2013   Procedure: ESOPHAGOGASTRODUODENOSCOPY (EGD);  Surgeon: Rogene Houston, MD;  [facility];  Service: Endoscopy;  Laterality: N/A;  730  . ESOPHAGOGASTRODUODENOSCOPY (EGD) WITH PROPOFOL N/A 12/23/2015   Procedure: ESOPHAGOGASTRODUODENOSCOPY (EGD) WITH PROPOFOL;  Surgeon:  Rogene Houston, MD;  [facility];  Service: Endoscopy;  Laterality: N/A;  . HERNIA REPAIR    . HOLMIUM LASER APPLICATION Left [id number]   Procedure: LEFT URETEROSCOPY WITH HOLMIUM LASER LITHOTRIPSY;  Surgeon: Cleon Gustin, MD;  Location: [facility];  Service: Urology;  Laterality: Left;  Marland Kitchen MALONEY DILATION N/A 10/09/2013   Procedure: MALONEY DILATION;  Surgeon: Rogene Houston, MD;  [facility];  Service: Endoscopy;  Laterality: N/A;  . NASAL SEPTUM SURGERY    . [place], 2008   2 yrs ago  . TOTAL ABDOMINAL HYSTERECTOMY     Left oophroectomy for a lare tumor about 21 yrs. RT ovary removed time of hysterectomy     No outpatient medications have been marked as taking for the 09/03/19 encounter (Appointment) with [name], MD.     Allergies:   Cephalexin, Latex, Betadine [povidone iodine], Cleocin [clindamycin hcl], Other, Sulfa antibiotics, and Toradol [ketorolac tromethamine]   Social History   Tobacco Use  . Smoking status: Current Every Day Smoker    Packs/day: 0.25    Years: 35.00    Pack years: 8.75    Types: Cigarettes  . Smokeless tobacco: Never Used  . Tobacco comment: 2/16 - 4 ciggs per day   Substance Use Topics  . Alcohol use: No  . Drug use: No     Family Hx: The patient's family history includes CAD in her mother; Cancer in her mother and sister; Hypertension in her brother; Liver disease in her brother; Obesity in her brother.  ROS:   Please see the history of present illness.     All other systems reviewed and are negative.   Prior CV studies:   The following studies were reviewed today:  10/2016 echo Study Conclusions  - Left ventricle: The cavity size was normal. Wall thickness was normal. Systolic function was normal. The estimated ejection fraction was in the range of 60% to 65%. Wall motion was normal; there were no regional wall motion abnormalities. Left ventricular diastolic function  parameters were normal. - Mitral valve: There was trivial regurgitation. - Right atrium: Central venous pressure (est): 8 mm Hg. - Atrial septum: No defect or patent foramen ovale was identified. - Tricuspid valve: There was trivial regurgitation. - Pulmonary arteries: PA peak pressure: 15 mm Hg (S). - Pericardium, extracardiac: There was no pericardial effusion.  Impressions:  - Normal LV wall thickness with LVEF 60-65% and normal diastolic function. Trivial mitral and tricuspid regurgitation. Normal estimated PASP 15 mmHg.  09/2018 echo IMPRESSIONS   1. The left ventricle has normal systolic function with an ejection fraction of 60-65%. The cavity size was normal. Left ventricular diastolic parameters were normal. No evidence of left ventricular regional wall motion abnormalities. 2. The right ventricle has normal systolic function. The cavity was normal. There is mildly increased right ventricular wall thickness. 3. Left atrial size was mildly dilated. 4. The mitral valve is grossly normal. There is mild mitral annular calcification present. 5. The tricuspid valve is  grossly normal. 6. The aortic valve is grossly normal. 7. The aortic root is normal in size and structure. 8. The inferior vena cava was dilated in size with <50% respiratory variability.  Labs/Other Tests and Data Reviewed:    EKG:  No ECG reviewed.  Recent Labs: 12/24/2018: TSH 0.811 05/20/2019: ALT 16; BUN 12; Creatinine, Ser 0.68; Hemoglobin 12.4; Platelets 178; Potassium 4.0; Sodium 140   Recent Lipid Panel Lab Results  Component Value Date/Time   CHOL  10/16/2006 01:39 AM    145        ATP III CLASSIFICATION:  <200     mg/dL   Desirable  200-239  mg/dL   Borderline High  >=240    mg/dL   High   TRIG 408 (H) 10/16/2006 01:39 AM   HDL <10 (L) 10/16/2006 01:39 AM   CHOLHDL NOT CALCULATED 10/16/2006 01:39 AM   LDLCALC  10/16/2006 01:39 AM    UNABLE TO CALCULATE IF TRIGLYCERIDE OVER 400  mg/dL        Total Cholesterol/HDL:CHD Risk Coronary Heart Disease Risk Table                     Men   Women  1/2 Average Risk   3.4   3.3    Wt Readings from Last 3 Encounters:  08/03/19 182 lb (82.6 kg)  07/02/19 180 lb (81.6 kg)  06/11/19 181 lb 3.2 oz (82.2 kg)     Objective:    Vital Signs:   Today's Vitals   09/03/19 0953  Weight: 175 lb (79.4 kg)  Height: 5' (1.524 m)   Body mass index is 34.18 kg/m. Normal affect. Normal speech pattern and tone. Comfortable, no apparent distress. No audible signs of sob or wheezing.   ASSESSMENT & PLAN:    1.Leg edema - significant downtrend in weights, continue torsemide - f/u upcoming labs at cancer center    F/u 1 month virtual, pending results may need to adjust torsmide.    COVID-19 Education: The signs and symptoms of COVID-19 were discussed with the patient and how to seek care for testing (follow up with PCP or arrange E-visit).  The importance of social distancing was discussed today.  Time:   Today, I have spent 13 minutes with the patient with telehealth technology discussing the above problems.     Medication Adjustments/Labs and Tests Ordered: Current medicines are reviewed at length with the patient today.  Concerns regarding medicines are outlined above.   Tests Ordered: No orders of the defined types were placed in this encounter.   Medication Changes: No orders of the defined types were placed in this encounter.   Follow Up:  Virtual Visit  in 1 month(s)  Signed, Carlyle Dolly, MD  09/03/2019 8:23 AM    [place]

## 2019-09-03 NOTE — Patient Instructions (Signed)
Medication Instructions:  Continue all current medications.  Labwork: none  Testing/Procedures: none  Follow-Up: 1 month   Any Other Special Instructions Will Be Listed Below (If Applicable).  If you need a refill on your cardiac medications before your next appointment, please call your pharmacy.  

## 2019-09-09 ENCOUNTER — Ambulatory Visit: Payer: Medicare Other | Admitting: Orthopaedic Surgery

## 2019-09-14 ENCOUNTER — Telehealth: Payer: Self-pay | Admitting: Orthopaedic Surgery

## 2019-09-14 NOTE — Telephone Encounter (Signed)
Patent requests refill on Oxycodone 15 mgs.  Qty  140  Sig: TAKE 1 TABLET BY MOUTH EVERY FIVE HOURS AS NEEDED FOR PAIN  Patient uses [facility] 

## 2019-09-15 ENCOUNTER — Inpatient Hospital Stay (HOSPITAL_COMMUNITY): Payer: Medicare Other

## 2019-09-15 MED ORDER — OXYCODONE HCL 15 MG PO TABS: ORAL_TABLET | ORAL | 0 refills | Status: DC

## 2019-09-16 ENCOUNTER — Other Ambulatory Visit: Payer: Self-pay

## 2019-09-16 ENCOUNTER — Inpatient Hospital Stay (HOSPITAL_COMMUNITY): Payer: Medicare Other | Attending: Hematology

## 2019-09-16 DIAGNOSIS — D472 Monoclonal gammopathy: Secondary | ICD-10-CM

## 2019-09-16 LAB — COMPREHENSIVE METABOLIC PANEL
ALT: 15 U/L (ref 0–44)
AST: 18 U/L (ref 15–41)
Albumin: 2.9 g/dL — ABNORMAL LOW (ref 3.5–5.0)
Alkaline Phosphatase: 50 U/L (ref 38–126)
Anion gap: 7 (ref 5–15)
BUN: 9 mg/dL (ref 6–20)
CO2: 25 mmol/L (ref 22–32)
Calcium: 8.4 mg/dL — ABNORMAL LOW (ref 8.9–10.3)
Chloride: 107 mmol/L (ref 98–111)
Creatinine, Ser: 0.71 mg/dL (ref 0.44–1.00)
GFR calc Af Amer: >60 mL/min (ref >60)
GFR calc non Af Amer: >60 mL/min (ref >60)
Glucose, Bld: 100 mg/dL — ABNORMAL HIGH (ref 70–99)
Potassium: 3.7 mmol/L (ref 3.5–5.1)
Sodium: 139 mmol/L (ref 135–145)
Total Bilirubin: 0.3 mg/dL (ref 0.3–1.2)
Total Protein: 7.3 g/dL (ref 6.5–8.1)

## 2019-09-16 LAB — CBC WITH DIFFERENTIAL/PLATELET
Abs Immature Granulocytes: 0.01 10*3/uL (ref 0.00–0.07)
Basophils Absolute: 0 10*3/uL (ref 0.0–0.1)
Basophils Relative: 1 %
Eosinophils Absolute: 0.3 10*3/uL (ref 0.0–0.5)
Eosinophils Relative: 8 %
HCT: 39.1 % (ref 36.0–46.0)
Hemoglobin: 12 g/dL (ref 12.0–15.0)
Immature Granulocytes: 0 %
Lymphocytes Relative: 40 %
Lymphs Abs: 1.6 10*3/uL (ref 0.7–4.0)
MCH: 28.8 pg (ref 26.0–34.0)
MCHC: 30.7 g/dL (ref 30.0–36.0)
MCV: 94 fL (ref 80.0–100.0)
Monocytes Absolute: 0.3 10*3/uL (ref 0.1–1.0)
Monocytes Relative: 9 %
Neutro Abs: 1.7 10*3/uL (ref 1.7–7.7)
Neutrophils Relative %: 42 %
Platelets: 170 10*3/uL (ref 150–400)
RBC: 4.16 MIL/uL (ref 3.87–5.11)
RDW: 14.7 % (ref 11.5–15.5)
WBC: 4 10*3/uL (ref 4.0–10.5)
nRBC: 0 % (ref 0.0–0.2)

## 2019-09-16 LAB — LACTATE DEHYDROGENASE: LDH: 143 U/L (ref 98–192)

## 2019-09-17 LAB — KAPPA/LAMBDA LIGHT CHAINS
Kappa free light chain: 59.8 mg/L — ABNORMAL HIGH (ref 3.3–19.4)
Kappa, lambda light chain ratio: 1.98 — ABNORMAL HIGH (ref 0.26–1.65)
Lambda free light chains: 30.2 mg/L — ABNORMAL HIGH (ref 5.7–26.3)

## 2019-09-18 LAB — PROTEIN ELECTROPHORESIS, SERUM
A/G Ratio: 0.7 (ref 0.7–1.7)
Albumin ELP: 2.8 g/dL — ABNORMAL LOW (ref 2.9–4.4)
Alpha-1-Globulin: 0.3 g/dL (ref 0.0–0.4)
Alpha-2-Globulin: 0.7 g/dL (ref 0.4–1.0)
Beta Globulin: 0.9 g/dL (ref 0.7–1.3)
Gamma Globulin: 2.2 g/dL — ABNORMAL HIGH (ref 0.4–1.8)
Globulin, Total: 4.1 g/dL — ABNORMAL HIGH (ref 2.2–3.9)
M-Spike, %: 1.6 g/dL — ABNORMAL HIGH
Total Protein ELP: 6.9 g/dL (ref 6.0–8.5)

## 2019-09-22 ENCOUNTER — Other Ambulatory Visit: Payer: Self-pay

## 2019-09-22 ENCOUNTER — Ambulatory Visit (HOSPITAL_COMMUNITY): Payer: Medicare Other | Admitting: Oncology

## 2019-09-22 ENCOUNTER — Inpatient Hospital Stay (HOSPITAL_BASED_OUTPATIENT_CLINIC_OR_DEPARTMENT_OTHER): Payer: Medicare Other | Admitting: Nurse Practitioner

## 2019-09-22 DIAGNOSIS — D472 Monoclonal gammopathy: Secondary | ICD-10-CM | POA: Diagnosis not present

## 2019-09-22 NOTE — Assessment & Plan Note (Addendum)
1. IgG kappa MGUS: -Evaluation for peripheral neuropathy showed 1.8 g of IgG kappa monoclonal protein. -PET scan on 01/26/2019 did not show any evidence of hypermetabolic soft tissue or osseous multiple myeloma. -She has refused bone marrow biopsy in the past.  We will continue to monitor closely. -Labs done on 09/16/2019 showed M spike 1.6 g/dL, previously 1.8 g/dL.  Free light chain ratio 1.98. -She will come back in 4 months with repeat labs and bone met survey.  2.  Osteoporosis: -DEXA scan from 06/10/2017 showed T score of -4.6. -She reportedly received Reclast injections 1 dose and once lost to follow-up. -We will discuss about restarting these on her next visit.  3.  Peripheral neuropathy: -She reportedly developed peripheral neuropathy in her 36s and had burning sensation in her feet extending all the way up to her knees. -Also burning sensation of the upper extremities to the level of her shoulders. -She is on Lyrica 100 mg 3 times a day.

## 2019-09-22 NOTE — Progress Notes (Signed)
[facility] Follow up:    Brandi Burly, MD [place] [id number]   DIAGNOSIS: IgG MGUS  CURRENT THERAPY: Observation  INTERVAL HISTORY: [name] 59 y.o. female was called for telephone visit for IgG MGUS.  Patient reports she is doing well since her last visit. Denies any nausea, vomiting, or diarrhea. Denies any new pains. Had not noticed any recent bleeding such as epistaxis, hematuria or hematochezia. Denies recent chest pain on exertion, shortness of breath on minimal exertion, pre-syncopal episodes, or palpitations. Denies any numbness or tingling in hands or feet. Denies any recent fevers, infections, or recent hospitalizations. Patient reports appetite at 75% and energy level at 25%.  She is eating well maintain her weight this time.    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   Grade I hemorrhoids 05/23/2017   Paget's disease of breast, right ([place]) 12/31/2016   Intractable migraine without aura and without status migrainosus 12/17/2016   Chronic right shoulder pain 09/04/2016   Cigarette nicotine dependence without complication 54/65/0354   Morbid obesity due to excess calories ([place]) 09/04/2016   RUQ abdominal pain 12/13/2015   Hematemesis with nausea 12/13/2015   Nausea with vomiting 12/13/2015   Kidney stone 11/17/2015   Right shoulder pain 05/26/2015   Dysphagia, unspecified(787.20) 10/01/2013   Unspecified hypothyroidism 10/01/2013   Unspecified sleep apnea 10/01/2013   Migraines 10/01/2013   GERD (gastroesophageal reflux disease) 10/01/2013   History of IBS 10/01/2013    is allergic to cephalexin, latex, betadine [povidone iodine], cleocin [clindamycin hcl], other, sulfa antibiotics, and toradol [ketorolac tromethamine].  MEDICAL HISTORY: Past Medical History:  Diagnosis  Date   Back pain    Depression    Fibromyalgia    GERD (gastroesophageal reflux disease)    Hypertension    diet control   Hypothyroid    IBS (irritable bowel syndrome)    Migraines    Obesity    Osteoarthritis    Sleep apnea     SURGICAL HISTORY: Past Surgical History:  Procedure Laterality Date   AMPUTATION  12/13/2011   Procedure: AMPUTATION DIGIT;  Surgeon: Marcheta Grammes, DPM;  Location: [facility];  Service: Orthopedics;  Laterality: Right;  amputation second toe right foot   BACK SURGERY     BIOPSY  12/23/2015   Procedure: BIOPSY;  Surgeon: Rogene Houston, MD;  Location[facility];  Service: Endoscopy;;  pre pyloric patches   BREAST BIOPSY     CHOLECYSTECTOMY     CYSTOSCOPY W/ URETERAL STENT PLACEMENT Left 10/09/2017   Procedure: CYSTOSCOPY WITH RETROGRADE PYELOGRAM/URETERAL STENT PLACEMENT;  Surgeon: Cleon Gustin, MD;  Location: [facility];  Service: Urology;  Laterality: Left;   CYSTOSCOPY WITH RETROGRADE PYELOGRAM, URETEROSCOPY AND STENT PLACEMENT Left 10/23/2017   Procedure: CYSTOSCOPY WITH LEFT RETROGRADE PYELOGRAM, LEFT URETEROSCOPY AND STENT EXCHANGE;  Surgeon: Cleon Gustin, MD;  Location: [facility];  Service: Urology;  Laterality: Left;   ESOPHAGOGASTRODUODENOSCOPY  05/28/2011   Procedure: ESOPHAGOGASTRODUODENOSCOPY (EGD);  Surgeon: Rogene Houston, MD;  [facility];  Service: Endoscopy;  Laterality: N/A;  730   ESOPHAGOGASTRODUODENOSCOPY N/A 10/09/2013   Procedure: ESOPHAGOGASTRODUODENOSCOPY (EGD);  Surgeon: Rogene Houston, MD;  [facility];  Service: Endoscopy;  Laterality: N/A;  730   ESOPHAGOGASTRODUODENOSCOPY (EGD) WITH PROPOFOL N/A 12/23/2015   Procedure: ESOPHAGOGASTRODUODENOSCOPY (EGD) WITH PROPOFOL;  Surgeon: Bernadene Person  Gloriann Loan, MD;  [facility];  Service: Endoscopy;  Laterality: N/A;   HERNIA REPAIR     HOLMIUM LASER APPLICATION Left 8/46/9629   Procedure: LEFT URETEROSCOPY WITH HOLMIUM  LASER LITHOTRIPSY;  Surgeon: Cleon Gustin, MD;  Location: [facility];  Service: Urology;  Laterality: Left;   MALONEY DILATION N/A 10/09/2013   Procedure: Venia Minks DILATION;  Surgeon: Rogene Houston, MD;  [facility];  Service: Endoscopy;  Laterality: N/A;   NASAL SEPTUM SURGERY     NECK SURGERY  1998, 2008   2 yrs ago   TOTAL ABDOMINAL HYSTERECTOMY     Left oophroectomy for a lare tumor about 21 yrs. RT ovary removed time of hysterectomy    SOCIAL HISTORY: Social History   Socioeconomic History   Marital status: Single    Spouse name: Not on file   Number of children: 0   Years of education: 9   Highest education level: Not on file  Occupational History   Occupation: Unemployed  Tobacco Use   Smoking status: Current Every Day Smoker    Packs/day: 0.25    Years: 35.00    Pack years: 8.75    Types: Cigarettes   Smokeless tobacco: Never Used   Tobacco comment: 2/16 - 4 ciggs per day   Substance and Sexual Activity   Alcohol use: No   Drug use: No   Sexual activity: Not on file  Other Topics Concern   Not on file  Social History Narrative   Lives with cousin, sister-in-law, nephew   Caffeine- rarely   Social Determinants of Health   Financial Resource Strain:    Difficulty of Paying Living Expenses:   Food Insecurity:    Worried About Charity fundraiser in the Last Year:    Arboriculturist in the Last Year:   Transportation Needs:    Film/video editor (Medical):    Lack of Transportation (Non-Medical):   Physical Activity:    Days of Exercise per Week:    Minutes of Exercise per Session:   Stress:    Feeling of Stress :   Social Connections:    Frequency of Communication with Friends and Family:    Frequency of Social Gatherings with Friends and Family:    Attends Religious Services:    Active Member of Clubs or Organizations:    Attends Music therapist:    Marital Status:   Intimate Partner  Violence:    Fear of Current or Ex-Partner:    Emotionally Abused:    Physically Abused:    Sexually Abused:     FAMILY HISTORY: Family History  Problem Relation Age of Onset   CAD Mother    Cancer Mother    Obesity Brother    Cancer Sister    Liver disease Brother    Hypertension Brother     Review of Systems  Constitutional: Positive for fatigue.  Musculoskeletal: Positive for arthralgias.  All other systems reviewed and are negative.   Vital signs: -Deferred to telephone visit  Physical Exam -Deferred due to telephone visit -Patient was alert and oriented the phone in no acute distress   LABORATORY DATA:  CBC    Component Value Date/Time   WBC 4.0 09/16/2019 1102   RBC 4.16 09/16/2019 1102   HGB 12.0 09/16/2019 1102   HCT 39.1 09/16/2019 1102   PLT 170 09/16/2019 1102   MCV 94.0 09/16/2019 1102   MCH 28.8 09/16/2019 1102   MCHC  30.7 09/16/2019 1102   RDW 14.7 09/16/2019 1102   LYMPHSABS 1.6 09/16/2019 1102   MONOABS 0.3 09/16/2019 1102   EOSABS 0.3 09/16/2019 1102   BASOSABS 0.0 09/16/2019 1102    CMP     Component Value Date/Time   NA 139 09/16/2019 1102   K 3.7 09/16/2019 1102   CL 107 09/16/2019 1102   CO2 25 09/16/2019 1102   GLUCOSE 100 (H) 09/16/2019 1102   BUN 9 09/16/2019 1102   CREATININE 0.71 09/16/2019 1102   CALCIUM 8.4 (L) 09/16/2019 1102   PROT 7.3 09/16/2019 1102   PROT 8.0 12/24/2018 1222   ALBUMIN 2.9 (L) 09/16/2019 1102   AST 18 09/16/2019 1102   ALT 15 09/16/2019 1102   ALKPHOS 50 09/16/2019 1102   BILITOT 0.3 09/16/2019 1102   GFRNONAA >60 09/16/2019 1102   GFRAA >60 09/16/2019 1102    All questions were answered to patient's stated satisfaction. Encouraged patient to call with any new concerns or questions before his next visit to the cancer center and we can certain see him sooner, if needed.     ASSESSMENT and THERAPY PLAN:   IgG monoclonal gammopathy 1. IgG kappa MGUS: -Evaluation for peripheral  neuropathy showed 1.8 g of IgG kappa monoclonal protein. -PET scan on 01/26/2019 did not show any evidence of hypermetabolic soft tissue or osseous multiple myeloma. -She has refused bone marrow biopsy in the past.  We will continue to monitor closely. -Labs done on 09/16/2019 showed M spike 1.6 g/dL, previously 1.8 g/dL.  Free light chain ratio 1.98. -She will come back in 4 months with repeat labs and bone met survey.  2.  Osteoporosis: -DEXA scan from 06/10/2017 showed T score of -4.6. -She reportedly received Reclast injections 1 dose and once lost to follow-up. -We will discuss about restarting these on her next visit.  3.  Peripheral neuropathy: -She reportedly developed peripheral neuropathy in her 42s and had burning sensation in her feet extending all the way up to her knees. -Also burning sensation of the upper extremities to the level of her shoulders. -She is on Lyrica 100 mg 3 times a day.   Orders Placed This Encounter  Procedures   DG Bone Survey Met    Standing Status:   Future    Standing Expiration Date:   03/23/2020    Order Specific Question:   Reason for Exam (SYMPTOM  OR DIAGNOSIS REQUIRED)    Answer:   MGUS    Order Specific Question:   Preferred imaging location?    Answer:   [facility]    Order Specific Question:   Radiology Contrast Protocol - do NOT remove file path    Answer:   [email or web address]    Order Specific Question:   Is patient pregnant?    Answer:   No    Order Specific Question:   Release to patient    Answer:   Immediate   Lactate dehydrogenase    Standing Status:   Future    Standing Expiration Date:   09/21/2020   Protein electrophoresis, serum    Standing Status:   Future    Standing Expiration Date:   09/21/2020   Immunofixation electrophoresis    Standing Status:   Future    Standing Expiration Date:   09/21/2020   Kappa/lambda light chains    Standing Status:   Future    Standing  Expiration Date:   09/21/2020   Vitamin B12    Standing Status:  Future    Standing Expiration Date:   09/21/2020   VITAMIN D 25 Hydroxy (Vit-D Deficiency, Fractures)    Standing Status:   Future    Standing Expiration Date:   09/21/2020   CBC with Differential/Platelet    Standing Status:   Future    Standing Expiration Date:   09/21/2020   Comprehensive metabolic panel    Standing Status:   Future    Standing Expiration Date:   09/21/2020   Ferritin    Standing Status:   Future    Standing Expiration Date:   09/21/2020   Iron and TIBC    Standing Status:   Future    Standing Expiration Date:   09/21/2020    All questions were answered. The patient knows to call the clinic with any problems, questions or concerns. We can certainly see the patient much sooner if necessary. This note was electronically signed.  I provided 30 minutes of non face-to-face telephone visit time during this encounter, and > 50% was spent counseling as documented under my assessment & plan.   Glennie Isle, NP-C 09/22/2019

## 2019-09-23 ENCOUNTER — Ambulatory Visit (INDEPENDENT_AMBULATORY_CARE_PROVIDER_SITE_OTHER): Payer: Medicare Other | Admitting: Orthopaedic Surgery

## 2019-09-23 ENCOUNTER — Encounter: Payer: Self-pay | Admitting: Orthopaedic Surgery

## 2019-09-23 DIAGNOSIS — F17210 Nicotine dependence, cigarettes, uncomplicated: Secondary | ICD-10-CM

## 2019-09-23 DIAGNOSIS — M25512 Pain in left shoulder: Secondary | ICD-10-CM | POA: Diagnosis not present

## 2019-09-23 DIAGNOSIS — G8929 Other chronic pain: Secondary | ICD-10-CM

## 2019-09-23 DIAGNOSIS — G894 Chronic pain syndrome: Secondary | ICD-10-CM | POA: Diagnosis not present

## 2019-09-23 NOTE — Progress Notes (Signed)
PROCEDURE NOTE:  The patient request injection, verbal consent was obtained.  The left shoulder was prepped appropriately after time out was performed.   Sterile technique was observed and injection of 1 cc of Depo-Medrol 40 mg with several cc's of plain xylocaine. Anesthesia was provided by ethyl chloride and a 20-gauge needle was used to inject the shoulder area. A posterior approach was used.  The injection was tolerated well.  A band aid dressing was applied.  The patient was advised to apply ice later today and tomorrow to the injection sight as needed.  I filled out handicap form for DMV.  Return in one month.  Call if any problem.  Precautions discussed.   Electronically Signed Sanjuana Kava, MD 6/23/202110:44 AM

## 2019-10-06 ENCOUNTER — Telehealth (INDEPENDENT_AMBULATORY_CARE_PROVIDER_SITE_OTHER): Payer: Medicare Other | Admitting: Cardiology

## 2019-10-06 ENCOUNTER — Encounter: Payer: Self-pay | Admitting: Cardiology

## 2019-10-06 VITALS — BP 113/70 | Ht 60.0 in | Wt 172.0 lb

## 2019-10-06 DIAGNOSIS — R600 Localized edema: Secondary | ICD-10-CM | POA: Diagnosis not present

## 2019-10-06 NOTE — Progress Notes (Signed)
Virtual Visit via Telephone Note   This visit type was conducted due to national recommendations for restrictions regarding the COVID-19 Pandemic (e.g. social distancing) in an effort to limit this patient's exposure and mitigate transmission in our community.  Due to her co-morbid illnesses, this patient is at least at moderate risk for complications without adequate follow up.  This format is felt to be most appropriate for this patient at this time.  The patient did not have access to video technology/had technical difficulties with video requiring transitioning to audio format only (telephone).  All issues noted in this document were discussed and addressed.  No physical exam could be performed with this format.  Please refer to the patient's chart for her  consent to telehealth for [facility].   The patient was identified using 2 identifiers.  Date:  10/06/2019   ID:  [name], DOB Feb 10, 1961, MRN [id number]  Patient Location: Home Provider Location: Office  PCP:  Neale Burly, MD  Cardiologist:  Carlyle Dolly, MD  Electrophysiologist:  None   Evaluation Performed:  Follow-Up Visit  Chief Complaint:  Follow up   History of Present Illness:    [name] is a 59 y.o. female seen today for follow up of the following medical problems.   1. LE edema 09/2018 echo LVEF 60-65%, normal RV function.  - weight today 172 lbs. Down from 173 lbs last visit. Significant downtrend over the last few months - she is on toresemide 80mg  daily, has done well on - 6/16 labs showed Cr 0.71 BUN 9 K 3.7 - swelling improved, but can be somewhat up and down.   The patient does not have symptoms concerning for COVID-19 infection (fever, chills, cough, or new shortness of breath).    Past Medical History:  Diagnosis Date  . Back pain   . Depression   . Fibromyalgia   . GERD (gastroesophageal reflux disease)   . Hypertension    diet control  . Hypothyroid   . IBS  (irritable bowel syndrome)   . Migraines   . Obesity   . Osteoarthritis   . Sleep apnea    Past Surgical History:  Procedure Laterality Date  . AMPUTATION  12/13/2011   Procedure: AMPUTATION DIGIT;  Surgeon: Marcheta Grammes, DPM;  [facility];  Service: Orthopedics;  Laterality: Right;  amputation second toe right foot  . BACK SURGERY    . BIOPSY  12/23/2015   Procedure: BIOPSY;  Surgeon: Rogene Houston, MD;  [facility];  Service: Endoscopy;;  pre pyloric patches  . BREAST BIOPSY    . CHOLECYSTECTOMY    . CYSTOSCOPY W/ URETERAL STENT PLACEMENT Left 10/09/2017   Procedure: CYSTOSCOPY WITH RETROGRADE PYELOGRAM/URETERAL STENT PLACEMENT;  Surgeon: Cleon Gustin, MD;  Location: [facility];  Service: Urology;  Laterality: Left;  . CYSTOSCOPY WITH RETROGRADE PYELOGRAM, URETEROSCOPY AND STENT PLACEMENT Left 10/23/2017   Procedure: CYSTOSCOPY WITH LEFT RETROGRADE PYELOGRAM, LEFT URETEROSCOPY AND STENT EXCHANGE;  Surgeon: Cleon Gustin, MD;  Location: [facility];  Service: Urology;  Laterality: Left;  . ESOPHAGOGASTRODUODENOSCOPY  05/28/2011   Procedure: ESOPHAGOGASTRODUODENOSCOPY (EGD);  Surgeon: Rogene Houston, MD;  [facility];  Service: Endoscopy;  Laterality: N/A;  730  . ESOPHAGOGASTRODUODENOSCOPY N/A 10/09/2013   Procedure: ESOPHAGOGASTRODUODENOSCOPY (EGD);  Surgeon: Rogene Houston, MD;  [facility];  Service: Endoscopy;  Laterality: N/A;  730  . ESOPHAGOGASTRODUODENOSCOPY (EGD) WITH PROPOFOL N/A 12/23/2015   Procedure: ESOPHAGOGASTRODUODENOSCOPY (EGD)  WITH PROPOFOL;  Surgeon: Rogene Houston, MD;  [facility];  Service: Endoscopy;  Laterality: N/A;  . HERNIA REPAIR    . HOLMIUM LASER APPLICATION Left 0/99/8338   Procedure: LEFT URETEROSCOPY WITH HOLMIUM LASER LITHOTRIPSY;  Surgeon: Cleon Gustin, MD;  Location: [facility];  Service: Urology;  Laterality: Left;  Marland Kitchen MALONEY DILATION N/A 10/09/2013   Procedure: MALONEY DILATION;   Surgeon: Rogene Houston, MD;  [facility];  Service: Endoscopy;  Laterality: N/A;  . NASAL SEPTUM SURGERY    . [place], 2008   2 yrs ago  . TOTAL ABDOMINAL HYSTERECTOMY     Left oophroectomy for a lare tumor about 21 yrs. RT ovary removed time of hysterectomy     Current Meds  Medication Sig  . amLODipine (NORVASC) 2.5 MG tablet TAKE 1 TABLET BY MOUTH EVERY DAY  . beta carotene w/minerals (OCUVITE) tablet Take 1 tablet by mouth daily.    . diclofenac Sodium (VOLTAREN) 1 % GEL apply FOUR grams TO affected joints AS NEEDED FOUR TIMES DAILY  . Docusate Sodium 100 MG capsule Take 100 mg by mouth 2 (two) times daily.  . DULoxetine (CYMBALTA) 20 MG capsule Take 20 mg by mouth 2 (two) times daily.  Marland Kitchen esomeprazole (NEXIUM) 40 MG capsule Take 1 capsule (40 mg total) by mouth daily before breakfast.  . hydrOXYzine (VISTARIL) 25 MG capsule Take 25 mg by mouth every 6 (six) hours as needed for anxiety.   Marland Kitchen levothyroxine (SYNTHROID) 125 MCG tablet Take 125 mcg by mouth daily.  Marland Kitchen linaclotide (LINZESS) 290 MCG CAPS capsule Take 290 mcg by mouth daily before breakfast.  . metoprolol tartrate (LOPRESSOR) 25 MG tablet Take 1 tablet (25 mg total) by mouth 2 (two) times daily.  . Multiple Vitamins-Iron (MULTIVITAMINS WITH IRON) TABS Take 1 tablet by mouth daily.  . Omega-3 Fatty Acids (FISH OIL) 1000 MG CAPS TAKE TWO CAPSULES BY MOUTH TWICE DAILY  . oxyCODONE (ROXICODONE) 15 MG immediate release tablet TAKE 1 TABLET BY MOUTH EVERY FIVE HOURS AS NEEDED FOR PAIN  . polyethylene glycol (MIRALAX / GLYCOLAX) packet Take 17 g by mouth 2 (two) times daily before a meal.    . potassium chloride (MICRO-K) 10 MEQ CR capsule Take 20 mEq by mouth daily.  . pregabalin (LYRICA) 75 MG capsule Take 75 mg by mouth 3 (three) times daily.  . Prucalopride Succinate (MOTEGRITY) 2 MG TABS Take 2 mg by mouth daily.  . silver sulfADIAZINE (SSD) 1 % cream APPLY TO THE AFFECTED AREA(S) TWICE DAILY UNTIL HEALED   . spironolactone (ALDACTONE) 25 MG tablet Take 0.5 tablets (12.5 mg total) by mouth daily.  . sucralfate (CARAFATE) 1 g tablet Take 1 g by mouth 4 (four) times daily.  . SUMAtriptan (IMITREX) 100 MG tablet TAKE 1 TABLET BY MOUTH AT ONSET OF HEADACHE. MAY REPEAT ONCE IN TWO HOURS. NO MORE THAN TWO TABLETS IN 24 HOURS  . topiramate (TOPAMAX) 100 MG tablet Take 50 mg by mouth 2 (two) times daily.   Marland Kitchen torsemide (DEMADEX) 20 MG tablet Take 4 tablets (80 mg total) by mouth daily.  . traZODone (DESYREL) 100 MG tablet Take 100 mg by mouth at bedtime.  . VENTOLIN HFA 108 (90 Base) MCG/ACT inhaler Inhale 1-2 puffs into the lungs every 6 (six) hours as needed for wheezing or shortness of breath.   . [DISCONTINUED] pregabalin (LYRICA) 100 MG capsule Take 100 mg by mouth 3 (three) times daily.   . [  DISCONTINUED] traZODone (DESYREL) 50 MG tablet Take 50 mg by mouth at bedtime.      Allergies:   Cephalexin, Latex, Betadine [povidone iodine], Cleocin [clindamycin hcl], Other, Sulfa antibiotics, and Toradol [ketorolac tromethamine]   Social History   Tobacco Use  . Smoking status: Current Every Day Smoker    Packs/day: 0.25    Years: 35.00    Pack years: 8.75    Types: Cigarettes  . Smokeless tobacco: Never Used  . Tobacco comment: 2/16 - 4 ciggs per day   Substance Use Topics  . Alcohol use: No  . Drug use: No     Family Hx: The patient's family history includes CAD in her mother; Cancer in her mother and sister; Hypertension in her brother; Liver disease in her brother; Obesity in her brother.  ROS:   Please see the history of present illness.     All other systems reviewed and are negative.   Prior CV studies:   The following studies were reviewed today:   Labs/Other Tests and Data Reviewed:    EKG:  No ECG reviewed.  Recent Labs: 12/24/2018: TSH 0.811 09/16/2019: ALT 15; BUN 9; Creatinine, Ser 0.71; Hemoglobin 12.0; Platelets 170; Potassium 3.7; Sodium 139   Recent Lipid  Panel Lab Results  Component Value Date/Time   CHOL  10/16/2006 01:39 AM    145        ATP III CLASSIFICATION:  <200     mg/dL   Desirable  200-239  mg/dL   Borderline High  >=240    mg/dL   High   TRIG 408 (H) 10/16/2006 01:39 AM   HDL <10 (L) 10/16/2006 01:39 AM   CHOLHDL NOT CALCULATED 10/16/2006 01:39 AM   LDLCALC  10/16/2006 01:39 AM    UNABLE TO CALCULATE IF TRIGLYCERIDE OVER 400 mg/dL        Total Cholesterol/HDL:CHD Risk Coronary Heart Disease Risk Table                     Men   Women  1/2 Average Risk   3.4   3.3    Wt Readings from Last 3 Encounters:  10/06/19 172 lb (78 kg)  09/03/19 175 lb (79.4 kg)  08/03/19 182 lb (82.6 kg)     Objective:    Vital Signs:  BP 113/70   Ht 5' (1.524 m)   Wt 172 lb (78 kg)   BMI 33.59 kg/m    NOrmal affect. Normal speech pattern and tone. Comfortable, no apparent distress. No audible signs of sob or wheezing.   ASSESSMENT & PLAN:    1. LE edema Has done well on toresmide 80mg  daily, weights stable around 172 lbs. Labs looked good on this regimen - continue current therapy.   COVID-19 Education: The signs and symptoms of COVID-19 were discussed with the patient and how to seek care for testing (follow up with PCP or arrange E-visit).  The importance of social distancing was discussed today.  Time:   Today, I have spent 12 minutes with the patient with telehealth technology discussing the above problems.     Medication Adjustments/Labs and Tests Ordered: Current medicines are reviewed at length with the patient today.  Concerns regarding medicines are outlined above.   Tests Ordered: No orders of the defined types were placed in this encounter.   Medication Changes: No orders of the defined types were placed in this encounter.   Follow Up:  Either In Person or Virtual in 3 month(s)  Merrily Pew, MD  10/06/2019 11:18 AM

## 2019-10-06 NOTE — Patient Instructions (Signed)
Your physician recommends that you schedule a follow-up appointment in: 3 MONTHS WITH DR BRANCH  Your physician recommends that you continue on your current medications as directed. Please refer to the Current Medication list given to you today.  Thank you for choosing [facility]!!    

## 2019-10-14 ENCOUNTER — Telehealth: Payer: Self-pay | Admitting: Orthopaedic Surgery

## 2019-10-14 MED ORDER — OXYCODONE HCL 15 MG PO TABS: ORAL_TABLET | ORAL | 0 refills | Status: DC

## 2019-10-14 NOTE — Telephone Encounter (Signed)
Patient requests refill on Oxycodone 15 mgs.  Qty  140  Sig: TAKE 1 TABLET BY MOUTH EVERY FIVE HOURS AS NEEDED FOR PAIN  Patient states she uses [facility] 

## 2019-10-21 ENCOUNTER — Encounter: Payer: Self-pay | Admitting: Orthopaedic Surgery

## 2019-10-21 ENCOUNTER — Ambulatory Visit (INDEPENDENT_AMBULATORY_CARE_PROVIDER_SITE_OTHER): Payer: Medicare Other | Admitting: Orthopaedic Surgery

## 2019-10-21 ENCOUNTER — Other Ambulatory Visit: Payer: Self-pay

## 2019-10-21 DIAGNOSIS — F17210 Nicotine dependence, cigarettes, uncomplicated: Secondary | ICD-10-CM | POA: Diagnosis not present

## 2019-10-21 DIAGNOSIS — M25512 Pain in left shoulder: Secondary | ICD-10-CM | POA: Diagnosis not present

## 2019-10-21 DIAGNOSIS — G894 Chronic pain syndrome: Secondary | ICD-10-CM | POA: Diagnosis not present

## 2019-10-21 DIAGNOSIS — G8929 Other chronic pain: Secondary | ICD-10-CM

## 2019-10-21 NOTE — Patient Instructions (Signed)

## 2019-10-21 NOTE — Progress Notes (Signed)
PROCEDURE NOTE:  The patient request injection, verbal consent was obtained.  The left shoulder was prepped appropriately after time out was performed.   Sterile technique was observed and injection of 1 cc of Depo-Medrol 40 mg with several cc's of plain xylocaine. Anesthesia was provided by ethyl chloride and a 20-gauge needle was used to inject the shoulder area. A posterior approach was used.  The injection was tolerated well.  A band aid dressing was applied.  The patient was advised to apply ice later today and tomorrow to the injection sight as needed.   Return in one month.  Electronically Signed Sanjuana Kava, MD 7/21/202111:09 AM

## 2019-10-22 ENCOUNTER — Encounter: Payer: Self-pay | Admitting: Internal Medicine

## 2019-10-22 ENCOUNTER — Ambulatory Visit (INDEPENDENT_AMBULATORY_CARE_PROVIDER_SITE_OTHER): Payer: Medicare Other | Admitting: Internal Medicine

## 2019-10-22 ENCOUNTER — Ambulatory Visit (HOSPITAL_COMMUNITY)
Admission: RE | Admit: 2019-10-22 | Discharge: 2019-10-22 | Disposition: A | Discharge status: Home / Self Care | Payer: Medicare Other | Source: Ambulatory Visit | Attending: Internal Medicine | Admitting: Internal Medicine

## 2019-10-22 VITALS — BP 120/80 | HR 70 | Temp 98.2°F | Ht 60.0 in | Wt 176.0 lb

## 2019-10-22 DIAGNOSIS — J449 Chronic obstructive pulmonary disease, unspecified: Secondary | ICD-10-CM

## 2019-10-22 DIAGNOSIS — F17210 Nicotine dependence, cigarettes, uncomplicated: Secondary | ICD-10-CM

## 2019-10-22 MED ORDER — AZITHROMYCIN 250 MG PO TABS
ORAL_TABLET | ORAL | 0 refills | Status: DC
Start: 2019-10-22 — End: 2020-02-02

## 2019-10-22 NOTE — Assessment & Plan Note (Signed)
Counseled re importance of smoking cessation but did not meet time criteria for separate billing            Each maintenance medication was reviewed in detail including emphasizing most importantly the difference between maintenance and prns and under what circumstances the prns are to be triggered using an action plan format where appropriate.  Total time for H and P, chart review, counseling, teaching device and generating customized AVS unique to this office visit / charting = 45 min      

## 2019-10-22 NOTE — Assessment & Plan Note (Addendum)
Active smoker / severe kyphosis   She has clinically severe combined obst/ restrictive dz and thinks she's on the elipta device so anoro  or Trelegy can be used here.  >>> rec zpak for mild CB    - The proper method of use, as well as anticipated side effects, of an elipta  inhaler are discussed and demonstrated to the patient  Unable to do teachback as she is unvaccinated   Will need pfts p vaccination or screening  Pt informed of the seriousness of COVID 19 infection as a direct risk to lung health  and safey and to close contacts and should continue to wear a facemask in public and minimize exposure to public locations but especially avoid any area or activity where non-close contacts are not observing distancing or wearing an appropriate face mask.  I strongly recommended she take either of the vaccines available through local drugstores based on updated information on millions of Americans treated with the [place] products  which have proven both safe and  effective even against the new delta variant.  Marland Kitchen

## 2019-10-22 NOTE — Progress Notes (Signed)
[name], female    DOB: 1961-02-11,    MRN: [id number]   Brief patient profile:  18 yowf active smoker with clinical dx of copd previously followed by Dr Luan Pulling referred to pulmonary clinic in [facility]  10/22/2019 by Leda Gauze NP    History of Present Illness / maint on ? elipta ?  10/22/2019  Pulmonary/ 1st office eval/Lyna Laningham  Dyspnea: limited by feet /legs hurt x  years and uses either cane or walker  Cough: worse x 6-8 m esp when try to lie down > slt yellow mucus also in am  so usually sits up but also back prevents lying down flat  Sleep: not well due mostly due to pain  SABA use: none now, has albuterol  02 prn up to 2lpm   No obvious day to day or daytime variability or assoc  r mucus plugs or hemoptysis or cp or chest tightness, subjective wheeze or overt sinus or hb symptoms.   Sleeping  without nocturnal  or early am exacerbation  of respiratory  c/o's or need for noct saba. Also denies any obvious fluctuation of symptoms with weather or environmental changes or other aggravating or alleviating factors except as outlined above   No unusual exposure hx or h/o childhood pna/ asthma or knowledge of premature birth.  Current Allergies, Complete Past Medical History, Past Surgical History, Family History, and Social History were reviewed in [employer] record.  ROS  The following are not active complaints unless bolded Hoarseness, sore throat, dysphagia, dental problems, itching, sneezing,  nasal congestion or discharge of excess mucus or purulent secretions, ear ache,   fever, chills, sweats, unintended wt loss or wt gain, classically pleuritic or exertional cp,  orthopnea pnd or arm/hand swelling  or leg swelling, presyncope, palpitations, abdominal pain, anorexia, nausea, vomiting, diarrhea  or change in bowel habits or change in bladder habits, change in stools or change in urine, dysuria, hematuria,  rash, arthralgias, visual complaints,  headache, numbness, weakness or ataxia or problems with walking or coordination,  change in mood or  memory.             Past Medical History:  Diagnosis Date  . Back pain   . Depression   . Fibromyalgia   . GERD (gastroesophageal reflux disease)   . Hypertension    diet control  . Hypothyroid   . IBS (irritable bowel syndrome)   . Migraines   . Obesity   . Osteoarthritis   . Sleep apnea     Outpatient Medications Prior to Visit  Medication Sig Dispense Refill  . amLODipine (NORVASC) 2.5 MG tablet TAKE 1 TABLET BY MOUTH EVERY DAY 90 tablet 1  . beta carotene w/minerals (OCUVITE) tablet Take 1 tablet by mouth daily.      . diclofenac Sodium (VOLTAREN) 1 % GEL apply FOUR grams TO affected joints AS NEEDED FOUR TIMES DAILY 500 g 5  . Docusate Sodium 100 MG capsule Take 100 mg by mouth 2 (two) times daily.    . DULoxetine (CYMBALTA) 20 MG capsule Take 20 mg by mouth 2 (two) times daily.    Marland Kitchen esomeprazole (NEXIUM) 40 MG capsule Take 1 capsule (40 mg total) by mouth daily before breakfast.    . hydrOXYzine (VISTARIL) 25 MG capsule Take 25 mg by mouth every 6 (six) hours as needed for anxiety.   5  . levothyroxine (SYNTHROID) 125 MCG tablet Take 125 mcg by mouth daily.    Marland Kitchen linaclotide Rolan Lipa)  290 MCG CAPS capsule Take 290 mcg by mouth daily before breakfast.    . metoprolol tartrate (LOPRESSOR) 25 MG tablet Take 1 tablet (25 mg total) by mouth 2 (two) times daily. 60 tablet 6  . Multiple Vitamins-Iron (MULTIVITAMINS WITH IRON) TABS Take 1 tablet by mouth daily.    . Omega-3 Fatty Acids (FISH OIL) 1000 MG CAPS TAKE TWO CAPSULES BY MOUTH TWICE DAILY    . oxyCODONE (ROXICODONE) 15 MG immediate release tablet TAKE 1 TABLET BY MOUTH EVERY FIVE HOURS AS NEEDED FOR PAIN 140 tablet 0  . polyethylene glycol (MIRALAX / GLYCOLAX) packet Take 17 g by mouth 2 (two) times daily before a meal.      . potassium chloride (MICRO-K) 10 MEQ CR capsule Take 20 mEq by mouth daily.    . pregabalin  (LYRICA) 75 MG capsule Take 75 mg by mouth 3 (three) times daily.    . Prucalopride Succinate (MOTEGRITY) 2 MG TABS Take 2 mg by mouth daily. 30 tablet 5  . silver sulfADIAZINE (SSD) 1 % cream APPLY TO THE AFFECTED AREA(S) TWICE DAILY UNTIL HEALED    . spironolactone (ALDACTONE) 25 MG tablet Take 0.5 tablets (12.5 mg total) by mouth daily. 15 tablet 6  . sucralfate (CARAFATE) 1 g tablet Take 1 g by mouth 4 (four) times daily.    . SUMAtriptan (IMITREX) 100 MG tablet TAKE 1 TABLET BY MOUTH AT ONSET OF HEADACHE. MAY REPEAT ONCE IN TWO HOURS. NO MORE THAN TWO TABLETS IN 24 HOURS  1  . topiramate (TOPAMAX) 100 MG tablet Take 50 mg by mouth 2 (two) times daily.     . traZODone (DESYREL) 100 MG tablet Take 100 mg by mouth at bedtime.    . VENTOLIN HFA 108 (90 Base) MCG/ACT inhaler Inhale 1-2 puffs into the lungs every 6 (six) hours as needed for wheezing or shortness of breath.     . torsemide (DEMADEX) 20 MG tablet Take 4 tablets (80 mg total) by mouth daily. 120 tablet 3   No facility-administered medications prior to visit.     Objective:     BP 120/80 (BP Location: Left Arm, Patient Position: Sitting, Cuff Size: Large)   Pulse 70   Temp 98.2 F (36.8 C) (Oral)   Ht 5' (1.524 m)   Wt 176 lb (79.8 kg)   SpO2 93%   BMI 34.37 kg/m   SpO2: 93 % RA  Elderly wf >>> stated age    HEENT : pt wearing mask not removed for exam due to covid - 19 concerns.    NECK :  without JVD/Nodes/TM/ nl carotid upstrokes bilaterally   LUNGS: no acc muscle use,  Mild barrel/hyphotic   contour chest wall with bilateral  Distant bs s audible wheeze and  without cough on insp or exp maneuvers  and mild  Hyperresonant  to  percussion bilaterally     CV:  RRR  no s3 or murmur or increase in P2, and no edema   ABD:  soft and nontender with pos end  insp Hoover's  in the supine position. No bruits or organomegaly appreciated, bowel sounds nl  MS:   Nl gait/  ext warm without deformities, calf tenderness,  cyanosis or clubbing No obvious joint restrictions   SKIN: warm and dry without lesions    NEURO:  alert, approp, nl sensorium with  no motor or cerebellar deficits apparent.      CXR PA and Lateral:   10/22/2019 :    I  personally reviewed images and agree with radiology impression as follows:   Mod/severe kyphosis/ copd with non-specific markings/ no acute change      Assessment   COPD  GOLD ? / active smoker with group B symptoms Active smoker / severe kyphosis   She has clinically severe combined obst/ restrictive dz and thinks she's on the elipta device so anoro  or Trelegy can be used here.  >>> rec zpak for mild CB     - The proper method of use, as well as anticipated side effects, of an elipta  inhaler are discussed and demonstrated to the patient  Unable to do teachback as she is unvaccinated   Will need pfts p vaccination or screening  Pt informed of the seriousness of COVID 19 infection as a direct risk to lung health  and safey and to close contacts and should continue to wear a facemask in public and minimize exposure to public locations but especially avoid any area or activity where non-close contacts are not observing distancing or wearing an appropriate face mask.  I strongly recommended she take either of the vaccines available through local drugstores based on updated information on millions of Americans treated with the [place] products  which have proven both safe and  effective even against the new delta variant.  .        Cigarette smoker Counseled re importance of smoking cessation but did not meet time criteria for separate billing      Each maintenance medication was reviewed in detail including emphasizing most importantly the difference between maintenance and prns and under what circumstances the prns are to be triggered using an action plan format where appropriate.  Total time for H and P, chart review, counseling, teaching device and  generating customized AVS unique to this office visit / charting = 45 min         Christinia Gully, MD 10/22/2019

## 2019-10-22 NOTE — Patient Instructions (Addendum)
Continue your inhaler and work on hard to reduce if not eliminate all smoking   zpak should change the mucus back to clear  Call us with the name of your inhaler    I very strongly recommend you get the moderna or pfizer vaccine as soon as possible based on your risk of dying from the virus  and the proven safety and benefit of these vaccines against even the delta variant.  This can save your life as well as  those of your loved ones,  especially if they are also not vaccinated.   Please remember to go to the  x-ray department  for your tests - we will call you with the results when they are available      Please schedule a follow up visit in 3 months with PFTs   but call sooner if needed

## 2019-10-26 ENCOUNTER — Other Ambulatory Visit: Payer: Self-pay | Admitting: Internal Medicine

## 2019-10-26 DIAGNOSIS — J449 Chronic obstructive pulmonary disease, unspecified: Secondary | ICD-10-CM

## 2019-10-26 NOTE — Progress Notes (Signed)
Spoke with pt and notified of results per Dr. Wert. Pt verbalized understanding and denied any questions. 

## 2019-11-11 ENCOUNTER — Telehealth: Payer: Self-pay | Admitting: Orthopaedic Surgery

## 2019-11-11 NOTE — Telephone Encounter (Signed)
Patient requests refill on Oxycodone 15 mgs.  Qty  140  Sig: TAKE 1 TABLET BY MOUTH EVERY FIVE HOURS AS NEEDED FOR PAIN  Patient states she uses [facility]

## 2019-11-12 MED ORDER — OXYCODONE HCL 15 MG PO TABS: ORAL_TABLET | ORAL | 0 refills | Status: DC

## 2019-11-18 ENCOUNTER — Other Ambulatory Visit: Payer: Self-pay

## 2019-11-18 ENCOUNTER — Encounter: Payer: Self-pay | Admitting: Orthopaedic Surgery

## 2019-11-18 ENCOUNTER — Ambulatory Visit (INDEPENDENT_AMBULATORY_CARE_PROVIDER_SITE_OTHER): Payer: Medicare Other | Admitting: Orthopaedic Surgery

## 2019-11-18 DIAGNOSIS — M25512 Pain in left shoulder: Secondary | ICD-10-CM | POA: Diagnosis not present

## 2019-11-18 DIAGNOSIS — G894 Chronic pain syndrome: Secondary | ICD-10-CM | POA: Diagnosis not present

## 2019-11-18 DIAGNOSIS — F17210 Nicotine dependence, cigarettes, uncomplicated: Secondary | ICD-10-CM | POA: Diagnosis not present

## 2019-11-18 DIAGNOSIS — G8929 Other chronic pain: Secondary | ICD-10-CM | POA: Diagnosis not present

## 2019-11-18 NOTE — Patient Instructions (Signed)

## 2019-11-18 NOTE — Progress Notes (Signed)
PROCEDURE NOTE:  The patient request injection, verbal consent was obtained.  The left shoulder was prepped appropriately after time out was performed.   Sterile technique was observed and injection of 1 cc of Depo-Medrol 40 mg with several cc's of plain xylocaine. Anesthesia was provided by ethyl chloride and a 20-gauge needle was used to inject the shoulder area. A posterior approach was used.  The injection was tolerated well.  A band aid dressing was applied.  The patient was advised to apply ice later today and tomorrow to the injection sight as needed.  Return in one month.  Call if any problem.  Precautions discussed.   Electronically Signed Sanjuana Kava, MD 8/18/202111:23 AM

## 2019-12-10 ENCOUNTER — Telehealth: Payer: Self-pay | Admitting: Orthopaedic Surgery

## 2019-12-10 NOTE — Telephone Encounter (Signed)
Patient called at 9:05pm today, Thursday, 9/9/2 to request refill oxyCODONE (ROXICODONE) 15 MG immediate release tablet 140 tablet  -[facility]  -patient is aware of appointment on 12/16/19. Reminded patient of clinic refill policy call-in times for requests.

## 2019-12-11 ENCOUNTER — Other Ambulatory Visit (HOSPITAL_COMMUNITY)
Admission: RE | Admit: 2019-12-11 | Discharge: 2019-12-11 | Disposition: A | Discharge status: Home / Self Care | Payer: Medicare Other | Source: Ambulatory Visit | Attending: Internal Medicine | Admitting: Internal Medicine

## 2019-12-11 ENCOUNTER — Telehealth: Payer: Self-pay | Admitting: Internal Medicine

## 2019-12-11 NOTE — Telephone Encounter (Signed)
Spoke with patient. She stated that she was scheduled for a COVID test today in [place] but was turned away because they told her that if the test was not going to be done at [facility], they couldn't test her. She was scheduled for 9/14 at 2pm at [facility] but had to cancel because she can not have any appointments before 230pm.   She is fully vaccinated and has a copy of her vaccine records.   She wants to know if she can be rescheduled for the PFT at [facility].   PCCs, can you all help her to get rescheduled for a PFT at [facility]? Thanks!

## 2019-12-13 MED ORDER — OXYCODONE HCL 15 MG PO TABS: ORAL_TABLET | ORAL | 0 refills | Status: DC

## 2019-12-14 NOTE — Telephone Encounter (Signed)
Spoke to pt & gave her the phone # for Respiratory Therapy so they can reschedule her PFT at [facility].  Nothing further needed.

## 2019-12-15 ENCOUNTER — Encounter (HOSPITAL_COMMUNITY): Payer: Medicare Other

## 2019-12-16 ENCOUNTER — Encounter: Payer: Self-pay | Admitting: Orthopaedic Surgery

## 2019-12-16 ENCOUNTER — Other Ambulatory Visit: Payer: Self-pay

## 2019-12-16 ENCOUNTER — Ambulatory Visit (INDEPENDENT_AMBULATORY_CARE_PROVIDER_SITE_OTHER): Payer: Medicare Other | Admitting: Orthopaedic Surgery

## 2019-12-16 VITALS — Ht 60.0 in | Wt 176.0 lb

## 2019-12-16 DIAGNOSIS — M25512 Pain in left shoulder: Secondary | ICD-10-CM

## 2019-12-16 DIAGNOSIS — G609 Hereditary and idiopathic neuropathy, unspecified: Secondary | ICD-10-CM

## 2019-12-16 DIAGNOSIS — G8929 Other chronic pain: Secondary | ICD-10-CM | POA: Diagnosis not present

## 2019-12-16 DIAGNOSIS — G894 Chronic pain syndrome: Secondary | ICD-10-CM

## 2019-12-16 DIAGNOSIS — F17210 Nicotine dependence, cigarettes, uncomplicated: Secondary | ICD-10-CM

## 2019-12-16 NOTE — Progress Notes (Signed)
PROCEDURE NOTE:  The patient request injection, verbal consent was obtained.  The left shoulder was prepped appropriately after time out was performed.   Sterile technique was observed and injection of 1 cc of Depo-Medrol 40 mg with several cc's of plain xylocaine. Anesthesia was provided by ethyl chloride and a 20-gauge needle was used to inject the shoulder area. A posterior approach was used.  The injection was tolerated well.  A band aid dressing was applied.  The patient was advised to apply ice later today and tomorrow to the injection sight as needed.  Return in one month.  I will set up pain clinic referral for her also.  Call if any problem.  Precautions discussed.   Electronically Signed Sanjuana Kava, MD 9/15/202110:21 AM

## 2019-12-16 NOTE — Addendum Note (Signed)
Addended by: Elizabeth Sauer on: 12/16/2019 11:46 AM   Modules accepted: Orders

## 2019-12-20 ENCOUNTER — Other Ambulatory Visit: Payer: Self-pay | Admitting: Cardiology

## 2019-12-28 ENCOUNTER — Other Ambulatory Visit (HOSPITAL_COMMUNITY)
Admission: RE | Admit: 2019-12-28 | Discharge: 2019-12-28 | Disposition: A | Discharge status: Home / Self Care | Payer: Medicare Other | Source: Ambulatory Visit | Attending: Internal Medicine | Admitting: Internal Medicine

## 2019-12-28 ENCOUNTER — Other Ambulatory Visit: Payer: Self-pay

## 2019-12-28 DIAGNOSIS — Z20822 Contact with and (suspected) exposure to covid-19: Secondary | ICD-10-CM | POA: Insufficient documentation

## 2019-12-28 DIAGNOSIS — Z01812 Encounter for preprocedural laboratory examination: Secondary | ICD-10-CM | POA: Diagnosis present

## 2019-12-29 ENCOUNTER — Telehealth: Payer: Self-pay

## 2019-12-29 LAB — SARS CORONAVIRUS 2 (TAT 6-24 HRS): SARS Coronavirus 2: NEGATIVE

## 2019-12-29 NOTE — Telephone Encounter (Signed)
Pt was informed of covid 19 results no further action is needed

## 2019-12-31 ENCOUNTER — Other Ambulatory Visit: Payer: Self-pay

## 2019-12-31 ENCOUNTER — Ambulatory Visit (HOSPITAL_COMMUNITY)
Admission: RE | Admit: 2019-12-31 | Discharge: 2019-12-31 | Disposition: A | Discharge status: Home / Self Care | Payer: Medicare Other | Source: Ambulatory Visit | Attending: Internal Medicine | Admitting: Internal Medicine

## 2019-12-31 DIAGNOSIS — J449 Chronic obstructive pulmonary disease, unspecified: Secondary | ICD-10-CM | POA: Insufficient documentation

## 2019-12-31 LAB — PULMONARY FUNCTION TEST
DL/VA % pred: 95 %
DL/VA: 4.16 ml/min/mmHg/L
DLCO unc % pred: 83 %
DLCO unc: 14.74 ml/min/mmHg
FEF 25-75 Post: 3.1 L/sec
FEF 25-75 Pre: 1.89 L/sec
FEF2575-%Change-Post: 63 %
FEF2575-%Pred-Post: 142 %
FEF2575-%Pred-Pre: 87 %
FEV1-%Change-Post: 14 %
FEV1-%Pred-Post: 84 %
FEV1-%Pred-Pre: 73 %
FEV1-Post: 1.87 L
FEV1-Pre: 1.63 L
FEV1FVC-%Change-Post: 0 %
FEV1FVC-%Pred-Pre: 106 %
FEV6-%Change-Post: 13 %
FEV6-%Pred-Post: 80 %
FEV6-%Pred-Pre: 70 %
FEV6-Post: 2.23 L
FEV6-Pre: 1.96 L
FEV6FVC-%Pred-Post: 103 %
FEV6FVC-%Pred-Pre: 103 %
FVC-%Change-Post: 13 %
FVC-%Pred-Post: 78 %
FVC-%Pred-Pre: 68 %
FVC-Post: 2.23 L
FVC-Pre: 1.96 L
Post FEV1/FVC ratio: 84 %
Post FEV6/FVC ratio: 100 %
Pre FEV1/FVC ratio: 83 %
Pre FEV6/FVC Ratio: 100 %
RV % pred: 137 %
RV: 2.43 L
TLC % pred: 103 %
TLC: 4.63 L

## 2019-12-31 MED ORDER — ALBUTEROL SULFATE (2.5 MG/3ML) 0.083% IN NEBU
2.5000 mg | INHALATION_SOLUTION | Freq: Once | RESPIRATORY_TRACT | Status: AC
  Administered 2019-12-31: 2.5 mg via RESPIRATORY_TRACT

## 2020-01-01 NOTE — Progress Notes (Signed)
Called and left message on voicemail to please return phone call to go over results. Contact number provided. 

## 2020-01-04 ENCOUNTER — Telehealth: Payer: Self-pay | Admitting: Internal Medicine

## 2020-01-04 ENCOUNTER — Telehealth: Payer: Self-pay | Admitting: Orthopaedic Surgery

## 2020-01-04 MED ORDER — OXYCODONE HCL 15 MG PO TABS
ORAL_TABLET | ORAL | 0 refills | Status: DC
Start: 2020-01-04 — End: 2020-02-09

## 2020-01-04 NOTE — Telephone Encounter (Signed)
Received a call from [place] the social worker and she was stating that [facility] did not receive the fax. I advised her that it did show it was faxed on 12/16/2019 and 12/23/2019. It was then closed. I advised her that I would fax the paperwork again.   I then talked to Mickel Baas and the phone call was then re-routed and the papers were faxed twice.

## 2020-01-04 NOTE — Telephone Encounter (Signed)
Patent requests refill on Oxycodone 15 mgs.  Qty  140  Sig: TAKE 1 TABLET BY MOUTH EVERY FIVE HOURS AS NEEDED FOR PAIN  Patient uses [facility]

## 2020-01-04 NOTE — Progress Notes (Signed)
Pt notified of results

## 2020-01-04 NOTE — Progress Notes (Signed)
Tried calling the pt and there was no answer and no VM. Will call back.

## 2020-01-04 NOTE — Telephone Encounter (Signed)
Tanda Rockers, MD sent to [name], [place] Call patient : [name] is c/w very mild copd, no change needed for now Be sure patient has/keeps f/u ov so we can go over all the details of this study and get a plan together moving forward - ok to move up f/u if not feeling better and wants to be seen sooner   Spoke with pt and notified of results per Dr. Melvyn Novas. Pt verbalized understanding and denied any questions.

## 2020-01-05 NOTE — Telephone Encounter (Signed)
The pharmacist from [facility] called back and asked if this prescription should read "must last 30 days".  I spoke to Dr. Luna Glasgow and he said yes, the prescription must last 30 days.  This was relayed back to the pharmacist per Dr Luna Glasgow

## 2020-01-08 ENCOUNTER — Telehealth: Payer: Self-pay | Admitting: Internal Medicine

## 2020-01-08 NOTE — Telephone Encounter (Signed)
Call patient : Study is c/w very mild copd, no change needed for now Be sure patient has/keeps f/u ov so we can go over all the details of this study and get a plan together moving forward - ok to move up f/u if not feeling better and wants to be seen sooner  Patient returned called. She was made aware of the results and verbalized understanding. Nothing further needed at time of call.

## 2020-01-12 ENCOUNTER — Ambulatory Visit: Payer: Medicare Other | Admitting: Cardiology

## 2020-01-13 ENCOUNTER — Ambulatory Visit: Payer: Medicare Other | Admitting: Orthopaedic Surgery

## 2020-01-19 ENCOUNTER — Inpatient Hospital Stay (HOSPITAL_COMMUNITY): Payer: Medicare Other

## 2020-01-21 ENCOUNTER — Ambulatory Visit: Payer: Medicare Other | Admitting: Internal Medicine

## 2020-01-26 ENCOUNTER — Ambulatory Visit (HOSPITAL_COMMUNITY): Payer: Medicare Other | Admitting: Hematology

## 2020-01-27 ENCOUNTER — Encounter: Payer: Self-pay | Admitting: Orthopaedic Surgery

## 2020-01-27 ENCOUNTER — Other Ambulatory Visit: Payer: Self-pay

## 2020-01-27 ENCOUNTER — Ambulatory Visit (INDEPENDENT_AMBULATORY_CARE_PROVIDER_SITE_OTHER): Payer: Medicare Other | Admitting: Orthopaedic Surgery

## 2020-01-27 DIAGNOSIS — M25512 Pain in left shoulder: Secondary | ICD-10-CM

## 2020-01-27 DIAGNOSIS — F17210 Nicotine dependence, cigarettes, uncomplicated: Secondary | ICD-10-CM

## 2020-01-27 DIAGNOSIS — G894 Chronic pain syndrome: Secondary | ICD-10-CM

## 2020-01-27 DIAGNOSIS — G8929 Other chronic pain: Secondary | ICD-10-CM

## 2020-01-27 DIAGNOSIS — M546 Pain in thoracic spine: Secondary | ICD-10-CM | POA: Diagnosis not present

## 2020-01-27 NOTE — Progress Notes (Signed)
PROCEDURE NOTE:  The patient request injection, verbal consent was obtained.  The left shoulder was prepped appropriately after time out was performed.   Sterile technique was observed and injection of 1 cc of Depo-Medrol 40 mg with several cc's of plain xylocaine. Anesthesia was provided by ethyl chloride and a 20-gauge needle was used to inject the shoulder area. A posterior approach was used.  The injection was tolerated well.  A band aid dressing was applied.  The patient was advised to apply ice later today and tomorrow to the injection sight as needed.  She also has chronic thoracic back pain.  She may need new MRI.  Copy of records to be sent to her pain management physician.  Encounter Diagnoses  Name Primary?  . Chronic left shoulder pain Yes  . Pain syndrome, chronic   . Cigarette nicotine dependence without complication   . Chronic midline thoracic back pain    I will see her in one month.  Call if any problem.  Precautions discussed.   Electronically Signed Sanjuana Kava, MD 10/27/202110:54 AM   She has no new trauma.

## 2020-02-02 ENCOUNTER — Encounter: Payer: Self-pay | Admitting: Internal Medicine

## 2020-02-02 ENCOUNTER — Other Ambulatory Visit: Payer: Self-pay

## 2020-02-02 ENCOUNTER — Ambulatory Visit (INDEPENDENT_AMBULATORY_CARE_PROVIDER_SITE_OTHER): Payer: Medicare Other | Admitting: Internal Medicine

## 2020-02-02 DIAGNOSIS — J449 Chronic obstructive pulmonary disease, unspecified: Secondary | ICD-10-CM | POA: Diagnosis not present

## 2020-02-02 DIAGNOSIS — R918 Other nonspecific abnormal finding of lung field: Secondary | ICD-10-CM

## 2020-02-02 DIAGNOSIS — F17210 Nicotine dependence, cigarettes, uncomplicated: Secondary | ICD-10-CM | POA: Diagnosis not present

## 2020-02-02 MED ORDER — AZITHROMYCIN 250 MG PO TABS: ORAL_TABLET | ORAL | 0 refills | Status: DC

## 2020-02-02 NOTE — Assessment & Plan Note (Addendum)
Will repeat zpak now and f/u with cxr in 2 weeks as originally planned          Each maintenance medication was reviewed in detail including emphasizing most importantly the difference between maintenance and prns and under what circumstances the prns are to be triggered using an action plan format where appropriate.  Total time for H and P, chart review, counseling  and generating customized AVS unique to this office visit / charting = 20 min

## 2020-02-02 NOTE — Patient Instructions (Addendum)
zpak for nasal problems  The main problem you have is smoking  - it's not too late to quit  If breathing getting worse I'll be happy to re-evaluate you if you bring all your respiratory medications/ devices including your portable system  No regular pulmonary follow up is needed

## 2020-02-02 NOTE — Assessment & Plan Note (Signed)
Active smoker / severe kyphosis  - PFT's  12/31/19   FEV1 1.87 (84 % ) ratio 0.84  p 14 % improvement from saba p albuterol prior to study with DLCO  17.65 (83%) corrects to 4.39 (94%)  for alv volume and FV curve min concavity    Her symptoms have leveled off and again have asked her to call with identification of her inhalers but no change needed for now   If worse > return with all meds in hand using a trust but verify approach to confirm accurate Medication  Reconciliation The principal here is that until we are certain that the  patients are doing what we've asked, it makes no sense to ask them to do more.

## 2020-02-02 NOTE — Assessment & Plan Note (Signed)
Counseled re importance of smoking cessation but did not meet time criteria for separate billing   °

## 2020-02-02 NOTE — Progress Notes (Signed)
[name], female    DOB: 1960/04/26,    MRN: [id number]   Brief patient profile:  16 yowf active smoker with clinical dx of copd previously followed by Dr Luan Pulling referred to pulmonary clinic in [facility]  10/22/2019 by Leda Gauze NP    History of Present Illness / maint on ? elipta ?  10/22/2019  Pulmonary/ 1st office eval/Akina Maish  Dyspnea: limited by feet /legs hurt x  years and uses either cane or walker  Cough: worse x 6-8 m esp when try to lie down > slt yellow mucus also in am  so usually sits up but also back prevents lying down flat  Sleep: not well due mostly due to pain  SABA use: none now, has albuterol  02 prn up to 2lpm  rec Continue your inhaler and work on hard to reduce if not eliminate all smoking  zpak should change the mucus back to clear Call us with the name of your inhaler   Please remember to go to the  x-ray department  for your tests - we will call you with the results when they are available      02/02/2020  f/u ov/[facility] office/Iyani Dresner re: still smoking/ gold 0 copd - still not clear what she takes at home as maint vs prns  Chief Complaint  Patient presents with  . Follow-up    nasal congestion, runny nose for couple weeks   Dyspnea:  50 ft slow by legs  Cough: assoc with pnds  Sleeping: sleeping upright due to pain not breathing problems  SABA use: ? name 02: 2lpm, at bedtime and  Prn    No obvious day to day or daytime variability or assoc excess/ purulent sputum or mucus plugs or hemoptysis or cp or chest tightness, subjective wheeze or overt  hb symptoms.   Sleeping as above without nocturnal  or early am exacerbation  of respiratory  c/o's or need for noct saba. Also denies any obvious fluctuation of symptoms with weather or environmental changes or other aggravating or alleviating factors except as outlined above   No unusual exposure hx or h/o childhood pna/ asthma or knowledge of premature birth.  Current Allergies, Complete Past  Medical History, Past Surgical History, Family History, and Social History were reviewed in [employer] record.  ROS  The following are not active complaints unless bolded Hoarseness, sore throat, dysphagia, dental problems, itching, sneezing,  nasal congestion or discharge of excess mucus or purulent secretions, ear ache,   fever, chills, sweats, unintended wt loss or wt gain, classically pleuritic or exertional cp,  orthopnea pnd or arm/hand swelling  or leg swelling, presyncope, palpitations, abdominal pain, anorexia, nausea, vomiting, diarrhea  or change in bowel habits or change in bladder habits, change in stools or change in urine, dysuria, hematuria,  rash, arthralgias, visual complaints, headache, numbness, weakness or ataxia or problems with walking/walks with cane or coordination,  change in mood or  memory.        Current Meds  Medication Sig  . amLODipine (NORVASC) 2.5 MG tablet TAKE 1 TABLET BY MOUTH EVERY DAY  . beta carotene w/minerals (OCUVITE) tablet Take 1 tablet by mouth daily.    . diclofenac Sodium (VOLTAREN) 1 % GEL apply FOUR grams TO affected joints AS NEEDED FOUR TIMES DAILY  . Docusate Sodium 100 MG capsule Take 100 mg by mouth 2 (two) times daily.  . DULoxetine (CYMBALTA) 20 MG capsule Take 20 mg by mouth 2 (two) times  daily.  . esomeprazole (NEXIUM) 40 MG capsule Take 1 capsule (40 mg total) by mouth daily before breakfast.  . hydrOXYzine (VISTARIL) 25 MG capsule Take 25 mg by mouth every 6 (six) hours as needed for anxiety.   Marland Kitchen levothyroxine (SYNTHROID) 125 MCG tablet Take 125 mcg by mouth daily.  Marland Kitchen linaclotide (LINZESS) 290 MCG CAPS capsule Take 290 mcg by mouth daily before breakfast.  . metoprolol tartrate (LOPRESSOR) 25 MG tablet TAKE 1 TABLET BY MOUTH TWICE DAILY  . Multiple Vitamins-Iron (MULTIVITAMINS WITH IRON) TABS Take 1 tablet by mouth daily.  . Omega-3 Fatty Acids (FISH OIL) 1000 MG CAPS TAKE TWO CAPSULES BY MOUTH TWICE DAILY  .  oxyCODONE (ROXICODONE) 15 MG immediate release tablet TAKE 1 TABLET BY MOUTH EVERY FIVE HOURS AS NEEDED FOR PAIN  . polyethylene glycol (MIRALAX / GLYCOLAX) packet Take 17 g by mouth 2 (two) times daily before a meal.    . potassium chloride (MICRO-K) 10 MEQ CR capsule Take 20 mEq by mouth daily.  . pregabalin (LYRICA) 75 MG capsule Take 75 mg by mouth 3 (three) times daily.  . Prucalopride Succinate (MOTEGRITY) 2 MG TABS Take 2 mg by mouth daily.  . silver sulfADIAZINE (SSD) 1 % cream APPLY TO THE AFFECTED AREA(S) TWICE DAILY UNTIL HEALED  . spironolactone (ALDACTONE) 25 MG tablet TAKE 1/2 TABLET BY MOUTH DAILY  . sucralfate (CARAFATE) 1 g tablet Take 1 g by mouth 4 (four) times daily.  . SUMAtriptan (IMITREX) 100 MG tablet TAKE 1 TABLET BY MOUTH AT ONSET OF HEADACHE. MAY REPEAT ONCE IN TWO HOURS. NO MORE THAN TWO TABLETS IN 24 HOURS  . topiramate (TOPAMAX) 100 MG tablet Take 50 mg by mouth 2 (two) times daily.   . traZODone (DESYREL) 100 MG tablet Take 100 mg by mouth at bedtime.  . VENTOLIN HFA 108 (90 Base) MCG/ACT inhaler Inhale 1-2 puffs into the lungs every 6 (six) hours as needed for wheezing or shortness of breath.                   Past Medical History:  Diagnosis Date  . Back pain   . Depression   . Fibromyalgia   . GERD (gastroesophageal reflux disease)   . Hypertension    diet control  . Hypothyroid   . IBS (irritable bowel syndrome)   . Migraines   . Obesity   . Osteoarthritis   . Sleep apnea        Objective:     02/02/2020        161   02/02/20 161 lb 6.4 oz (73.2 kg)  12/16/19 176 lb (79.8 kg)  10/22/19 176 lb (79.8 kg)     Vital signs reviewed - Note on arrival 02/02/2020  02 sats  100% on RA      HEENT : pt wearing mask not removed for exam due to covid - 19 concerns.   NECK :  without JVD/Nodes/TM/ nl carotid upstrokes bilaterally   LUNGS: no acc muscle use,  Min barrel  contour chest wall with bilateral  Min exp rhonchi and  without cough on  insp or exp maneuvers and min  Hyperresonant  to  percussion bilaterally     CV:  RRR  no s3 or murmur or increase in P2, and no edema   ABD:  soft and nontender with pos end  insp Hoover's  in the supine position. No bruits or organomegaly appreciated, bowel sounds nl  MS:  ext warm without deformities,  calf tenderness, cyanosis or clubbing No obvious joint restrictions   SKIN: warm and dry without lesions    NEURO:  alert, approp, nl sensorium with  no motor or cerebellar deficits apparent.          Assessment

## 2020-02-04 ENCOUNTER — Ambulatory Visit: Payer: Medicare Other | Admitting: Family Medicine

## 2020-02-04 ENCOUNTER — Telehealth: Payer: Self-pay | Admitting: Internal Medicine

## 2020-02-04 NOTE — Telephone Encounter (Signed)
ATC patient unable to leave message will try back tomorrow

## 2020-02-05 NOTE — Telephone Encounter (Signed)
ATC patient x2--unable to leave vm due to mailbox being full. Will close encounter per office protocol.  

## 2020-02-07 NOTE — Progress Notes (Deleted)
Cardiology Office Note  Date: 02/07/2020   ID: [name], DOB [birth year], MRN [id number]  PCP:  Neale Burly, MD  Cardiologist:  Carlyle Dolly, MD Electrophysiologist:  None   Chief Complaint: Follow up LE edema  History of Present Illness: [name] is a 59 y.o. female with a history of LE edema, HTN, GERD, hypothyroidism, obesity, OSA.  Last seen via telemedicine Dr. Harl Bowie 10/06/2019.  Weight was 172 pounds.  Had significantly decreased over the prior few months.  She was taking torsemide 80 mg daily.  Swelling had improved but somewhat up-and-down.  Continuing current therapy.  Past Medical History:  Diagnosis Date  . Back pain   . Depression   . Fibromyalgia   . GERD (gastroesophageal reflux disease)   . Hypertension    diet control  . Hypothyroid   . IBS (irritable bowel syndrome)   . Migraines   . Obesity   . Osteoarthritis   . Sleep apnea     Past Surgical History:  Procedure Laterality Date  . AMPUTATION  12/13/2011   Procedure: AMPUTATION DIGIT;  Surgeon: Marcheta Grammes, DPM;  Location: [facility];  Service: Orthopedics;  Laterality: Right;  amputation second toe right foot  . BACK SURGERY    . BIOPSY  12/23/2015   Procedure: BIOPSY;  Surgeon: Rogene Houston, MD;  Location: [facility];  Service: Endoscopy;;  pre pyloric patches  . BREAST BIOPSY    . CHOLECYSTECTOMY    . CYSTOSCOPY W/ URETERAL STENT PLACEMENT Left 10/09/2017   Procedure: CYSTOSCOPY WITH RETROGRADE PYELOGRAM/URETERAL STENT PLACEMENT;  Surgeon: Cleon Gustin, MD;  Location: [facility];  Service: Urology;  Laterality: Left;  . CYSTOSCOPY WITH RETROGRADE PYELOGRAM, URETEROSCOPY AND STENT PLACEMENT Left 10/23/2017   Procedure: CYSTOSCOPY WITH LEFT RETROGRADE PYELOGRAM, LEFT URETEROSCOPY AND STENT EXCHANGE;  Surgeon: Cleon Gustin, MD;  Location: [facility];  Service: Urology;  Laterality: Left;  . ESOPHAGOGASTRODUODENOSCOPY  05/28/2011   Procedure:  ESOPHAGOGASTRODUODENOSCOPY (EGD);  Surgeon: Rogene Houston, MD;  Location: [facility];  Service: Endoscopy;  Laterality: N/A;  730  . ESOPHAGOGASTRODUODENOSCOPY N/A 10/09/2013   Procedure: ESOPHAGOGASTRODUODENOSCOPY (EGD);  Surgeon: Rogene Houston, MD;  Location: [facility];  Service: Endoscopy;  Laterality: N/A;  730  . ESOPHAGOGASTRODUODENOSCOPY (EGD) WITH PROPOFOL N/A 12/23/2015   Procedure: ESOPHAGOGASTRODUODENOSCOPY (EGD) WITH PROPOFOL;  Surgeon: Rogene Houston, MD;  [facility]: [facility];  Service: Endoscopy;  Laterality: N/A;  . HERNIA REPAIR    . HOLMIUM LASER APPLICATION Left 8/41/3244   Procedure: LEFT URETEROSCOPY WITH HOLMIUM LASER LITHOTRIPSY;  Surgeon: Cleon Gustin, MD;  Location: [facility];  Service: Urology;  Laterality: Left;  Marland Kitchen MALONEY DILATION N/A 10/09/2013   Procedure: MALONEY DILATION;  Surgeon: Rogene Houston, MD;  Location: [facility];  Service: Endoscopy;  Laterality: N/A;  . NASAL SEPTUM SURGERY    . [place], 2008   2 yrs ago  . TOTAL ABDOMINAL HYSTERECTOMY     Left oophroectomy for a lare tumor about 21 yrs. RT ovary removed time of hysterectomy    Current Outpatient Medications  Medication Sig Dispense Refill  . amLODipine (NORVASC) 2.5 MG tablet TAKE 1 TABLET BY MOUTH EVERY DAY 90 tablet 1  . azithromycin (ZITHROMAX) 250 MG tablet Take 2 on day one then 1 daily x 4 days 6 tablet 0  . beta carotene w/minerals (OCUVITE) tablet Take 1 tablet by mouth daily.      . diclofenac  Sodium (VOLTAREN) 1 % GEL apply FOUR grams TO affected joints AS NEEDED FOUR TIMES DAILY 500 g 5  . Docusate Sodium 100 MG capsule Take 100 mg by mouth 2 (two) times daily.    . DULoxetine (CYMBALTA) 20 MG capsule Take 20 mg by mouth 2 (two) times daily.    Marland Kitchen esomeprazole (NEXIUM) 40 MG capsule Take 1 capsule (40 mg total) by mouth daily before breakfast.    . hydrOXYzine (VISTARIL) 25 MG capsule Take 25 mg by mouth every 6 (six) hours as needed for anxiety.   5  .  levothyroxine (SYNTHROID) 125 MCG tablet Take 125 mcg by mouth daily.    Marland Kitchen linaclotide (LINZESS) 290 MCG CAPS capsule Take 290 mcg by mouth daily before breakfast.    . metoprolol tartrate (LOPRESSOR) 25 MG tablet TAKE 1 TABLET BY MOUTH TWICE DAILY 180 tablet 1  . Multiple Vitamins-Iron (MULTIVITAMINS WITH IRON) TABS Take 1 tablet by mouth daily.    . Omega-3 Fatty Acids (FISH OIL) 1000 MG CAPS TAKE TWO CAPSULES BY MOUTH TWICE DAILY    . oxyCODONE (ROXICODONE) 15 MG immediate release tablet TAKE 1 TABLET BY MOUTH EVERY FIVE HOURS AS NEEDED FOR PAIN 140 tablet 0  . polyethylene glycol (MIRALAX / GLYCOLAX) packet Take 17 g by mouth 2 (two) times daily before a meal.      . potassium chloride (MICRO-K) 10 MEQ CR capsule Take 20 mEq by mouth daily.    . pregabalin (LYRICA) 75 MG capsule Take 75 mg by mouth 3 (three) times daily.    . Prucalopride Succinate (MOTEGRITY) 2 MG TABS Take 2 mg by mouth daily. 30 tablet 5  . silver sulfADIAZINE (SSD) 1 % cream APPLY TO THE AFFECTED AREA(S) TWICE DAILY UNTIL HEALED    . spironolactone (ALDACTONE) 25 MG tablet TAKE 1/2 TABLET BY MOUTH DAILY 45 tablet 1  . sucralfate (CARAFATE) 1 g tablet Take 1 g by mouth 4 (four) times daily.    . SUMAtriptan (IMITREX) 100 MG tablet TAKE 1 TABLET BY MOUTH AT ONSET OF HEADACHE. MAY REPEAT ONCE IN TWO HOURS. NO MORE THAN TWO TABLETS IN 24 HOURS  1  . topiramate (TOPAMAX) 100 MG tablet Take 50 mg by mouth 2 (two) times daily.     Marland Kitchen torsemide (DEMADEX) 20 MG tablet Take 4 tablets (80 mg total) by mouth daily. 120 tablet 3  . traZODone (DESYREL) 100 MG tablet Take 100 mg by mouth at bedtime.    . VENTOLIN HFA 108 (90 Base) MCG/ACT inhaler Inhale 1-2 puffs into the lungs every 6 (six) hours as needed for wheezing or shortness of breath.      No current facility-administered medications for this visit.   Allergies:  Cephalexin, Latex, Betadine [povidone iodine], Cleocin [clindamycin hcl], Other, Sulfa antibiotics, and Toradol  [ketorolac tromethamine]   Social History: The patient  reports that she has been smoking cigarettes. She has a 8.75 pack-year smoking history. She has never used smokeless tobacco. She reports that she does not drink alcohol and does not use drugs.   Family History: The patient's family history includes CAD in her mother; Cancer in her mother and sister; Hypertension in her brother; Liver disease in her brother; Obesity in her brother.   ROS:  Please see the history of present illness. Otherwise, complete review of systems is positive for {NONE DEFAULTED:18576::"none"}.  All other systems are reviewed and negative.   Physical Exam: VS:  There were no vitals taken for this visit., BMI There  is no height or weight on file to calculate BMI.  Wt Readings from Last 3 Encounters:  02/02/20 161 lb 6.4 oz (73.2 kg)  12/16/19 176 lb (79.8 kg)  10/22/19 176 lb (79.8 kg)    General: Patient appears comfortable at rest. HEENT: Conjunctiva and lids normal, oropharynx clear with moist mucosa. Neck: Supple, no elevated JVP or carotid bruits, no thyromegaly. Lungs: Clear to auscultation, nonlabored breathing at rest. Cardiac: Regular rate and rhythm, no S3 or significant systolic murmur, no pericardial rub. Abdomen: Soft, nontender, no hepatomegaly, bowel sounds present, no guarding or rebound. Extremities: No pitting edema, distal pulses 2+. Skin: Warm and dry. Musculoskeletal: No kyphosis. Neuropsychiatric: Alert and oriented x3, affect grossly appropriate.  ECG:  {EKG/Telemetry Strips [phone]}  Recent Labwork: 09/16/2019: ALT 15; AST 18; BUN 9; Creatinine, Ser 0.71; Hemoglobin 12.0; Platelets 170; Potassium 3.7; Sodium 139     Component Value Date/Time   CHOL  10/16/2006 0139    145        ATP III CLASSIFICATION:  <200     mg/dL   Desirable  200-239  mg/dL   Borderline High  >=240    mg/dL   High   TRIG 408 (H) 10/16/2006 0139   HDL <10 (L) 10/16/2006 0139   CHOLHDL NOT  CALCULATED 10/16/2006 0139   VLDL UNABLE TO CALCULATE IF TRIGLYCERIDE OVER 400 mg/dL 10/16/2006 0139   LDLCALC  10/16/2006 0139    UNABLE TO CALCULATE IF TRIGLYCERIDE OVER 400 mg/dL        Total Cholesterol/HDL:CHD Risk Coronary Heart Disease Risk Table                     Men   Women  1/2 Average Risk   3.4   3.3    Other Studies Reviewed Today:  Echocardiogram 09/02/2018 IMPRESSIONS  1. The left ventricle has normal systolic function with an ejection  fraction of 60-65%. The cavity size was normal. Left ventricular diastolic  parameters were normal. No evidence of left ventricular regional wall  motion abnormalities.  2. The right ventricle has normal systolic function. The cavity was  normal. There is mildly increased right ventricular wall thickness.  3. Left atrial size was mildly dilated.  4. The mitral valve is grossly normal. There is mild mitral annular  calcification present.  5. The tricuspid valve is grossly normal.  6. The aortic valve is grossly normal.  7. The aortic root is normal in size and structure.  8. The inferior vena cava was dilated in size with <50% respiratory  variability.   FINDINGS  Left Ventricle: The left ventricle has normal systolic function, with an  ejection fraction of 60-65%. The cavity size was normal. There is no  increase in left ventricular wall thickness. Left ventricular diastolic  parameters were normal. No evidence of  left ventricular regional wall motion abnormalities..   Right Ventricle: The right ventricle has normal systolic function. The  cavity was normal. There is mildly increased right ventricular wall  thickness.   Left Atrium: Left atrial size was mildly dilated.   Right Atrium: Right atrial size was normal in size. Right atrial pressure  is estimated at 8 mmHg.   Interatrial Septum: No atrial level shunt detected by color flow Doppler.   Pericardium: There is no evidence of pericardial effusion.   Mitral  Valve: The mitral valve is grossly normal. There is mild mitral  annular calcification present. Mitral valve regurgitation is not  visualized by color flow  Doppler.   Tricuspid Valve: The tricuspid valve is grossly normal. Tricuspid valve  regurgitation is trivial by color flow Doppler.   Aortic Valve: The aortic valve is grossly normal Aortic valve  regurgitation was not visualized by color flow Doppler. There is no  evidence of aortic valve stenosis.   Pulmonic Valve: The pulmonic valve was grossly normal. Pulmonic valve  regurgitation is not visualized by color flow Doppler.   Aorta: The aortic root is normal in size and structure.   Venous: The inferior vena cava is dilated in size with less than 50%  respiratory variability.  Assessment and Plan:  1. Bilateral lower extremity edema      Medication Adjustments/Labs and Tests Ordered: Current medicines are reviewed at length with the patient today.  Concerns regarding medicines are outlined above.   Disposition: Follow-up with ***  Signed, Levell July, NP 02/07/2020 10:32 PM    [place] [facility] [place], [place], [place] [address] Phone: [phone]; Fax: [phone]

## 2020-02-08 ENCOUNTER — Telehealth: Payer: Self-pay | Admitting: *Deleted

## 2020-02-08 ENCOUNTER — Ambulatory Visit: Payer: Medicare Other | Admitting: Family Medicine

## 2020-02-08 ENCOUNTER — Telehealth: Payer: Self-pay | Admitting: Orthopaedic Surgery

## 2020-02-08 DIAGNOSIS — R600 Localized edema: Secondary | ICD-10-CM

## 2020-02-08 NOTE — Telephone Encounter (Signed)
Patient requests refill on Oxycodone 15 mgs.  Sig: TAKE 1 TABLET BY MOUTH EVERY FIVE HOURS AS NEEDED FOR PAIN  Patient states she uses [facility]

## 2020-02-08 NOTE — Telephone Encounter (Signed)
-----   Message from Tanda Rockers, MD sent at 02/02/2020  5:22 PM EDT ----- Needs cxr in 2 weeeks (post zpak) to recheck cxr - was supposed to have this in July 2021 but did not return so make sure she does this time with reminder and letter if she doesn't show up

## 2020-02-08 NOTE — Telephone Encounter (Signed)
Tried calling the pt, NA and her VM box is full so not able to leave msg, WCB.

## 2020-02-09 ENCOUNTER — Encounter: Payer: Self-pay | Admitting: Family Medicine

## 2020-02-09 ENCOUNTER — Encounter: Payer: Self-pay | Admitting: *Deleted

## 2020-02-09 MED ORDER — OXYCODONE HCL 15 MG PO TABS
ORAL_TABLET | ORAL | 0 refills | Status: DC
Start: 2020-02-09 — End: 2020-03-09

## 2020-02-09 NOTE — Telephone Encounter (Signed)
Tried calling the pt and there was still no answer- Letter mailed

## 2020-02-15 ENCOUNTER — Other Ambulatory Visit: Payer: Self-pay | Admitting: Cardiology

## 2020-03-09 ENCOUNTER — Ambulatory Visit: Payer: Medicare Other | Admitting: Orthopaedic Surgery

## 2020-03-09 ENCOUNTER — Other Ambulatory Visit: Payer: Self-pay

## 2020-03-09 MED ORDER — OXYCODONE HCL 15 MG PO TABS
ORAL_TABLET | ORAL | 0 refills | Status: DC
Start: 2020-03-09 — End: 2020-03-28

## 2020-03-28 ENCOUNTER — Other Ambulatory Visit: Payer: Self-pay | Admitting: Orthopaedic Surgery

## 2020-03-28 NOTE — Telephone Encounter (Signed)
This is a Dr. Hilda Lias patient.   Patient uses [facility]. Please advise.

## 2020-04-05 ENCOUNTER — Telehealth: Payer: Self-pay | Admitting: Orthopaedic Surgery

## 2020-04-05 MED ORDER — OXYCODONE HCL 15 MG PO TABS: ORAL_TABLET | ORAL | 0 refills | Status: DC

## 2020-04-05 NOTE — Telephone Encounter (Signed)
Patient requests refill on Oxycodone 15 mgs. ° °Sig: TAKE 1 TABLET BY MOUTH EVERY FIVE HOURS AS NEEDED FOR PAIN ° °Patient states she uses [facility] °

## 2020-04-06 ENCOUNTER — Encounter: Payer: Self-pay | Admitting: Orthopaedic Surgery

## 2020-04-06 ENCOUNTER — Ambulatory Visit (INDEPENDENT_AMBULATORY_CARE_PROVIDER_SITE_OTHER): Payer: Medicare Other | Admitting: Orthopaedic Surgery

## 2020-04-06 ENCOUNTER — Other Ambulatory Visit: Payer: Self-pay

## 2020-04-06 DIAGNOSIS — G8929 Other chronic pain: Secondary | ICD-10-CM

## 2020-04-06 DIAGNOSIS — M25512 Pain in left shoulder: Secondary | ICD-10-CM

## 2020-04-06 DIAGNOSIS — G894 Chronic pain syndrome: Secondary | ICD-10-CM | POA: Diagnosis not present

## 2020-04-06 DIAGNOSIS — F17210 Nicotine dependence, cigarettes, uncomplicated: Secondary | ICD-10-CM

## 2020-04-06 NOTE — Progress Notes (Signed)
PROCEDURE NOTE:  The patient request injection, verbal consent was obtained.  The left shoulder was prepped appropriately after time out was performed.   Sterile technique was observed and injection of 1 cc of Depo-Medrol 40 mg with several cc's of plain xylocaine. Anesthesia was provided by ethyl chloride and a 20-gauge needle was used to inject the shoulder area. A posterior approach was used.  The injection was tolerated well.  A band aid dressing was applied.  The patient was advised to apply ice later today and tomorrow to the injection sight as needed.  Return in one month.  Call if any problem.  Precautions discussed.   Electronically Signed Darreld Mclean, MD 1/5/202211:08 AM

## 2020-04-07 ENCOUNTER — Telehealth: Payer: Self-pay | Admitting: Orthopaedic Surgery

## 2020-04-07 NOTE — Telephone Encounter (Signed)
   [name] called and was upset stating that she felt that she was lied to.  She said Dr Hilda Lias told her that she could get her pain medication on the 7th but when she called [facility] they told her that she couldn't get the prescription until the 8th.  She was upset.  I told her that Dr. Hilda Lias was no longer in the office, that he was here until lunch.  I told her that the only thing I could do would be send a message to Dr. Hilda Lias to let him know of this but I was unsure if he would see it since he was already gone.  She does seemed to understand.

## 2020-04-13 NOTE — Telephone Encounter (Signed)
ERROR

## 2020-05-04 ENCOUNTER — Ambulatory Visit (INDEPENDENT_AMBULATORY_CARE_PROVIDER_SITE_OTHER): Payer: Medicare Other | Admitting: Orthopaedic Surgery

## 2020-05-04 ENCOUNTER — Encounter: Payer: Self-pay | Admitting: Orthopaedic Surgery

## 2020-05-04 DIAGNOSIS — G8929 Other chronic pain: Secondary | ICD-10-CM | POA: Diagnosis not present

## 2020-05-04 DIAGNOSIS — G894 Chronic pain syndrome: Secondary | ICD-10-CM | POA: Diagnosis not present

## 2020-05-04 DIAGNOSIS — M25512 Pain in left shoulder: Secondary | ICD-10-CM

## 2020-05-04 DIAGNOSIS — F17210 Nicotine dependence, cigarettes, uncomplicated: Secondary | ICD-10-CM

## 2020-05-04 NOTE — Progress Notes (Signed)
PROCEDURE NOTE:  The patient request injection, verbal consent was obtained.  The left shoulder was prepped appropriately after time out was performed.   Sterile technique was observed and injection of 1 cc of Depo-Medrol 40 mg with several cc's of plain xylocaine. Anesthesia was provided by ethyl chloride and a 20-gauge needle was used to inject the shoulder area. A posterior approach was used.  The injection was tolerated well.  A band aid dressing was applied.  The patient was advised to apply ice later today and tomorrow to the injection sight as needed.  Return in one month.  Electronically Signed Sanjuana Kava, MD 2/2/202210:39 AM

## 2020-05-04 NOTE — Patient Instructions (Signed)

## 2020-05-10 ENCOUNTER — Telehealth: Payer: Self-pay | Admitting: Orthopaedic Surgery

## 2020-05-10 MED ORDER — OXYCODONE HCL 15 MG PO TABS
ORAL_TABLET | ORAL | 0 refills | Status: DC
Start: 2020-05-10 — End: 2020-06-06

## 2020-05-10 NOTE — Telephone Encounter (Signed)
Patient requests refill on Oxycodone 15 mgs.    Sig: TAKE 1 TABLET BY MOUTH FIVE TIMES DAILY AS NEEDED FOR PAIN - must last 30 DAYS  Patient states she uses [facility]

## 2020-06-01 ENCOUNTER — Encounter: Payer: Self-pay | Admitting: Orthopaedic Surgery

## 2020-06-01 ENCOUNTER — Other Ambulatory Visit: Payer: Self-pay

## 2020-06-01 ENCOUNTER — Ambulatory Visit (INDEPENDENT_AMBULATORY_CARE_PROVIDER_SITE_OTHER): Payer: Medicare Other | Admitting: Orthopaedic Surgery

## 2020-06-01 DIAGNOSIS — M25512 Pain in left shoulder: Secondary | ICD-10-CM

## 2020-06-01 DIAGNOSIS — G8929 Other chronic pain: Secondary | ICD-10-CM | POA: Diagnosis not present

## 2020-06-01 DIAGNOSIS — F17210 Nicotine dependence, cigarettes, uncomplicated: Secondary | ICD-10-CM | POA: Diagnosis not present

## 2020-06-01 DIAGNOSIS — G894 Chronic pain syndrome: Secondary | ICD-10-CM | POA: Diagnosis not present

## 2020-06-01 NOTE — Progress Notes (Signed)
PROCEDURE NOTE:  The patient request injection, verbal consent was obtained.  The left shoulder was prepped appropriately after time out was performed.   Sterile technique was observed and injection of 1 cc of Celestone 6 mg with several cc's of plain xylocaine. Anesthesia was provided by ethyl chloride and a 20-gauge needle was used to inject the shoulder area. A posterior approach was used.  The injection was tolerated well.  A band aid dressing was applied.  The patient was advised to apply ice later today and tomorrow to the injection sight as needed.  I will try to arrange for home health nurse to come by and check on her periodically.  Return in one month.  Call if any problem.  Precautions discussed.   Electronically Signed Sanjuana Kava, MD 3/2/202210:55 AM

## 2020-06-06 ENCOUNTER — Telehealth: Payer: Self-pay | Admitting: Orthopaedic Surgery

## 2020-06-06 MED ORDER — OXYCODONE HCL 15 MG PO TABS
ORAL_TABLET | ORAL | 0 refills | Status: DC
Start: 2020-06-06 — End: 2020-07-06

## 2020-06-06 NOTE — Telephone Encounter (Signed)
Patient requests refill on Oxycodone 15 mgs.    Sig: TAKE 1 TABLET BY MOUTH FIVE TIMES DAILY AS NEEDED FOR PAIN - must last 30 DAYS  Patient states she uses [facility]

## 2020-06-06 NOTE — Telephone Encounter (Signed)
Estill Bamberg from the home health facility called.  She said she needs some clarification on this order regarding nursing and PT.  Please call her at [phone]

## 2020-06-06 NOTE — Telephone Encounter (Signed)
I called Amanda at [facility] and per the last note Dr. Luna Glasgow did not order any physical therapy per the last note.   I let her know that per Dr. Luna Glasgow this patient has a lower leg wound that would need to be addressed. Estill Bamberg will open the case and have someone to go out and help the patient with the wound care since she lives alone.

## 2020-06-14 ENCOUNTER — Other Ambulatory Visit: Payer: Self-pay | Admitting: Cardiology

## 2020-06-17 ENCOUNTER — Telehealth: Payer: Self-pay | Admitting: Internal Medicine

## 2020-06-17 NOTE — Telephone Encounter (Signed)
I have called and LM on VM for the pt to make her aware that MW will be in the [place] office on Monday and that she can drop that DMV form off at that time.

## 2020-06-24 ENCOUNTER — Ambulatory Visit (INDEPENDENT_AMBULATORY_CARE_PROVIDER_SITE_OTHER): Payer: Medicare Other | Admitting: Family Medicine

## 2020-06-24 ENCOUNTER — Encounter: Payer: Self-pay | Admitting: Family Medicine

## 2020-06-24 ENCOUNTER — Other Ambulatory Visit: Payer: Self-pay

## 2020-06-24 VITALS — BP 90/64 | HR 62 | Ht 60.0 in | Wt 163.6 lb

## 2020-06-24 DIAGNOSIS — I10 Essential (primary) hypertension: Secondary | ICD-10-CM | POA: Diagnosis not present

## 2020-06-24 DIAGNOSIS — R600 Localized edema: Secondary | ICD-10-CM | POA: Diagnosis not present

## 2020-06-24 NOTE — Addendum Note (Signed)
Addended by: Julian Hy T on: 06/24/2020 04:39 PM   Modules accepted: Orders

## 2020-06-24 NOTE — Patient Instructions (Addendum)
Medication Instructions:   Stop Amlodipine (Norvasc).    Continue all other medications.    Labwork: none  Testing/Procedures: none  Follow-Up: 6 months   Any Other Special Instructions Will Be Listed Below (If Applicable).  If you need a refill on your cardiac medications before your next appointment, please call your pharmacy.

## 2020-06-24 NOTE — Progress Notes (Signed)
Cardiology Office Note  Date: 06/24/2020   ID: [name], DOB Mar 24, 1961, MRN [id number]  PCP:  Neale Burly, MD  Cardiologist:  Carlyle Dolly, MD Electrophysiologist:  None   Chief Complaint: Follow up  History of Present Illness: [name] is a 60 y.o. female with a history of  Lower extremity edema, OSA, obesity, HTN. GERD. Hypothyroidism.  Last visit via telemedicine with Dr Harl Bowie. She was doing well on Torsemide 80 mg daily and weights were stable around 172 lbs. Labs looked good. She was continuing therapy.  She is here for follow-up today.  She denies any recent acute illnesses or hospitalizations.  Continues with chronic lower extremity edema and continues to take torsemide 80 mg daily.  Weight is maintaining around 160s.  Blood pressure is well controlled.  In fact he has a low today at 90/64.  She denies any changes in medical therapy otherwise.  Denies any anginal or exertional symptoms.  States she does have some occasional dizziness.  But no syncopal or near syncopal episodes.  Denies any palpitations or arrhythmias, CVA or TIA-like symptoms, bleeding, PND, orthopnea.  No claudication-like symptoms.  No DVT or PE-like symptoms.  Past Medical History:  Diagnosis Date  . Back pain   . Depression   . Fibromyalgia   . GERD (gastroesophageal reflux disease)   . Hypertension    diet control  . Hypothyroid   . IBS (irritable bowel syndrome)   . Migraines   . Obesity   . Osteoarthritis   . Sleep apnea     Past Surgical History:  Procedure Laterality Date  . AMPUTATION  12/13/2011   Procedure: AMPUTATION DIGIT;  Surgeon: Marcheta Grammes, DPM;  Location: [facility];  Service: Orthopedics;  Laterality: Right;  amputation second toe right foot  . BACK SURGERY    . BIOPSY  12/23/2015   Procedure: BIOPSY;  Surgeon: Rogene Houston, MD;  Location: [facility];  Service: Endoscopy;;  pre pyloric patches  . BREAST BIOPSY    . CHOLECYSTECTOMY    .  CYSTOSCOPY W/ URETERAL STENT PLACEMENT Left 10/09/2017   Procedure: CYSTOSCOPY WITH RETROGRADE PYELOGRAM/URETERAL STENT PLACEMENT;  Surgeon: Cleon Gustin, MD;  Location: [facility];  Service: Urology;  Laterality: Left;  . CYSTOSCOPY WITH RETROGRADE PYELOGRAM, URETEROSCOPY AND STENT PLACEMENT Left 10/23/2017   Procedure: CYSTOSCOPY WITH LEFT RETROGRADE PYELOGRAM, LEFT URETEROSCOPY AND STENT EXCHANGE;  Surgeon: Cleon Gustin, MD;  Location: [facility];  Service: Urology;  Laterality: Left;  . ESOPHAGOGASTRODUODENOSCOPY  05/28/2011   Procedure: ESOPHAGOGASTRODUODENOSCOPY (EGD);  Surgeon: Rogene Houston, MD;  Location: [facility];  Service: Endoscopy;  Laterality: N/A;  730  . ESOPHAGOGASTRODUODENOSCOPY N/A 10/09/2013   Procedure: ESOPHAGOGASTRODUODENOSCOPY (EGD);  Surgeon: Rogene Houston, MD;  [facility]: [facility];  Service: Endoscopy;  Laterality: N/A;  730  . ESOPHAGOGASTRODUODENOSCOPY (EGD) WITH PROPOFOL N/A 12/23/2015   Procedure: ESOPHAGOGASTRODUODENOSCOPY (EGD) WITH PROPOFOL;  Surgeon: Rogene Houston, MD;  [facility];  Service: Endoscopy;  Laterality: N/A;  . HERNIA REPAIR    . HOLMIUM LASER APPLICATION Left 05/16/863   Procedure: LEFT URETEROSCOPY WITH HOLMIUM LASER LITHOTRIPSY;  Surgeon: Cleon Gustin, MD;  Location: [facility];  Service: Urology;  Laterality: Left;  Marland Kitchen MALONEY DILATION N/A 10/09/2013   Procedure: MALONEY DILATION;  Surgeon: Rogene Houston, MD;  Location: [facility];  Service: Endoscopy;  Laterality: N/A;  . NASAL SEPTUM SURGERY    . Robinson, 2008  2 yrs ago  . TOTAL ABDOMINAL HYSTERECTOMY     Left oophroectomy for a lare tumor about 21 yrs. RT ovary removed time of hysterectomy    Current Outpatient Medications  Medication Sig Dispense Refill  . beta carotene w/minerals (OCUVITE) tablet Take 1 tablet by mouth daily.    . diclofenac Sodium (VOLTAREN) 1 % GEL apply FOUR grams TO affected joints AS NEEDED FOUR TIMES DAILY 500 g 5  .  Docusate Sodium 100 MG capsule Take 100 mg by mouth 2 (two) times daily.    . DULoxetine (CYMBALTA) 20 MG capsule Take 20 mg by mouth 2 (two) times daily.    Marland Kitchen esomeprazole (NEXIUM) 40 MG capsule Take 1 capsule (40 mg total) by mouth daily before breakfast.    . hydrOXYzine (VISTARIL) 25 MG capsule Take 25 mg by mouth every 6 (six) hours as needed for anxiety.   5  . levothyroxine (SYNTHROID) 125 MCG tablet Take 125 mcg by mouth daily.    Marland Kitchen linaclotide (LINZESS) 290 MCG CAPS capsule Take 290 mcg by mouth daily before breakfast.    . metoprolol tartrate (LOPRESSOR) 25 MG tablet TAKE 1 TABLET BY MOUTH TWICE DAILY 180 tablet 1  . Multiple Vitamins-Iron (MULTIVITAMINS WITH IRON) TABS Take 1 tablet by mouth daily.    Marland Kitchen oxyCODONE (ROXICODONE) 15 MG immediate release tablet TAKE 1 TABLET BY MOUTH FIVE TIMES DAILY AS NEEDED FOR PAIN - must last 30 DAYS 140 tablet 0  . polyethylene glycol (MIRALAX / GLYCOLAX) packet Take 17 g by mouth 2 (two) times daily before a meal.    . potassium chloride (MICRO-K) 10 MEQ CR capsule Take 20 mEq by mouth daily.    . pregabalin (LYRICA) 75 MG capsule Take 75 mg by mouth 3 (three) times daily.    . Prucalopride Succinate (MOTEGRITY) 2 MG TABS Take 2 mg by mouth daily. 30 tablet 5  . silver sulfADIAZINE (SILVADENE) 1 % cream APPLY TO THE AFFECTED AREA(S) TWICE DAILY UNTIL HEALED    . spironolactone (ALDACTONE) 25 MG tablet TAKE 1/2 TABLET BY MOUTH DAILY 22 tablet 0  . sucralfate (CARAFATE) 1 g tablet Take 1 g by mouth 4 (four) times daily.    . SUMAtriptan (IMITREX) 100 MG tablet TAKE 1 TABLET BY MOUTH AT ONSET OF HEADACHE. MAY REPEAT ONCE IN TWO HOURS. NO MORE THAN TWO TABLETS IN 24 HOURS  1  . topiramate (TOPAMAX) 100 MG tablet Take 50 mg by mouth 2 (two) times daily.    Marland Kitchen torsemide (DEMADEX) 20 MG tablet TAKE 4 TABLETS BY MOUTH EVERY DAY 60 tablet 0  . traZODone (DESYREL) 100 MG tablet Take 100 mg by mouth at bedtime.    . VENTOLIN HFA 108 (90 Base) MCG/ACT inhaler  Inhale 1-2 puffs into the lungs every 6 (six) hours as needed for wheezing or shortness of breath.      No current facility-administered medications for this visit.   Allergies:  Cephalexin, Latex, Betadine [povidone iodine], Cleocin [clindamycin hcl], Other, Sulfa antibiotics, and Toradol [ketorolac tromethamine]   Social History: The patient  reports that she has been smoking cigarettes. She has a 8.75 pack-year smoking history. She has never used smokeless tobacco. She reports that she does not drink alcohol and does not use drugs.   Family History: The patient's family history includes CAD in her mother; Cancer in her mother and sister; Hypertension in her brother; Liver disease in her brother; Obesity in her brother.   ROS:  Please see the history  of present illness. Otherwise, complete review of systems is positive for none.  All other systems are reviewed and negative.   Physical Exam: VS:  BP 90/64   Pulse 62   Ht 5' (1.524 m)   Wt 163 lb 9.6 oz (74.2 kg)   SpO2 99%   BMI 31.95 kg/m , BMI Body mass index is 31.95 kg/m.  Wt Readings from Last 3 Encounters:  06/24/20 163 lb 9.6 oz (74.2 kg)  02/02/20 161 lb 6.4 oz (73.2 kg)  12/16/19 176 lb (79.8 kg)    General: Patient appears comfortable at rest. Neck: Supple, no elevated JVP or carotid bruits, no thyromegaly. Lungs: Clear to auscultation, nonlabored breathing at rest. Cardiac: Regular rate and rhythm, no S3 or significant systolic murmur, no pericardial rub. Abdomen: Soft, nontender, no hepatomegaly, bowel sounds present, no guarding or rebound. Extremities: Chronic lower extremity edema 2+.  Both lower extremities are wrapped today, unable to check distal pulses in lower extremities. Skin: Warm and dry. Musculoskeletal: No kyphosis. Neuropsychiatric: Alert and oriented x3, affect grossly appropriate.  ECG:  An ECG dated 06/24/2020 was personally reviewed today and demonstrated:  Sinus bradycardia with a rate of 58.  No  acute ST or T wave abnormalities.  Recent Labwork: 09/16/2019: ALT 15; AST 18; BUN 9; Creatinine, Ser 0.71; Hemoglobin 12.0; Platelets 170; Potassium 3.7; Sodium 139     Component Value Date/Time   CHOL  10/16/2006 0139    145        ATP III CLASSIFICATION:  <200     mg/dL   Desirable  200-239  mg/dL   Borderline High  >=240    mg/dL   High   TRIG 408 (H) 10/16/2006 0139   HDL <10 (L) 10/16/2006 0139   CHOLHDL NOT CALCULATED 10/16/2006 0139   VLDL UNABLE TO CALCULATE IF TRIGLYCERIDE OVER 400 mg/dL 10/16/2006 0139   LDLCALC  10/16/2006 0139    UNABLE TO CALCULATE IF TRIGLYCERIDE OVER 400 mg/dL        Total Cholesterol/HDL:CHD Risk Coronary Heart Disease Risk Table                     Men   Women  1/2 Average Risk   3.4   3.3    Other Studies Reviewed Today:  Echocardiogram 09/30/2018 1. The left ventricle has normal systolic function with an ejection  fraction of 60-65%. The cavity size was normal. Left ventricular diastolic  parameters were normal. No evidence of left ventricular regional wall  motion abnormalities.  2. The right ventricle has normal systolic function. The cavity was  normal. There is mildly increased right ventricular wall thickness.  3. Left atrial size was mildly dilated.  4. The mitral valve is grossly normal. There is mild mitral annular  calcification present.  5. The tricuspid valve is grossly normal.  6. The aortic valve is grossly normal.  7. The aortic root is normal in size and structure.  8. The inferior vena cava was dilated in size with <50% respiratory  Variability.  Assessment and Plan:  1. Bilateral lower extremity edema   2. Essential hypertension    1. Bilateral lower extremity edema Continues with chronic bilateral lower extremity edema.  She has both lower extremities wrapped today. Continue torsemide 80 mg daily.  Continue spironolactone 12.5 mg daily.  2. Essential hypertension Blood pressure is low today at 90/64.  DC  amlodipine.  Continue metoprolol 25 mg p.o. twice daily.  Medication Adjustments/Labs and Tests Ordered:  Current medicines are reviewed at length with the patient today.  Concerns regarding medicines are outlined above.   Disposition: Follow-up with Dr. Harl Bowie or APP 6 months.  Signed, Levell July, NP 06/24/2020 4:05 PM    [facility] at [place], [place], [place] [address] Phone: [phone]; Fax: [phone]

## 2020-06-27 ENCOUNTER — Telehealth: Payer: Self-pay | Admitting: Orthopaedic Surgery

## 2020-06-27 NOTE — Telephone Encounter (Addendum)
Michele from [facility] called and stated that she has attempted to get with Ms. Arey to go out to see her but has not been able to speak to her.  She said therefore there has been a delay with the start of her care.  Hopefully she will get to see her this week.  Michelle's phone number is [phone]

## 2020-06-29 ENCOUNTER — Ambulatory Visit: Payer: Medicare Other | Admitting: Orthopaedic Surgery

## 2020-07-05 ENCOUNTER — Telehealth: Payer: Self-pay

## 2020-07-05 NOTE — Telephone Encounter (Signed)
Oxycodone 15 mg Qty 140 Tablets  PATIENT USES [facility]

## 2020-07-06 MED ORDER — OXYCODONE HCL 15 MG PO TABS: ORAL_TABLET | ORAL | 0 refills | Status: DC

## 2020-07-07 ENCOUNTER — Telehealth: Payer: Self-pay | Admitting: Orthopaedic Surgery

## 2020-07-07 ENCOUNTER — Ambulatory Visit: Payer: Medicare Other | Admitting: Internal Medicine

## 2020-07-07 NOTE — Telephone Encounter (Signed)
Dr. Luna Glasgow is aware.

## 2020-07-07 NOTE — Telephone Encounter (Signed)
Clare Gandy, PT assistant with [facility] called yesterday afternoon.  He was with [name] at the time he called.  He wanted to let Dr. Luna Glasgow know that [name] is reporting 9 out of 10 pain in her neck, spine and low back.  He just wanted to let Dr. Luna Glasgow know this.  If you or Dr. Luna Glasgow want to call [name] back, his phone number is [phone]  Thanks

## 2020-07-13 ENCOUNTER — Encounter: Payer: Self-pay | Admitting: Orthopaedic Surgery

## 2020-07-13 ENCOUNTER — Ambulatory Visit (INDEPENDENT_AMBULATORY_CARE_PROVIDER_SITE_OTHER): Payer: Medicare Other | Admitting: Orthopaedic Surgery

## 2020-07-13 ENCOUNTER — Other Ambulatory Visit: Payer: Self-pay | Admitting: Cardiology

## 2020-07-13 ENCOUNTER — Other Ambulatory Visit: Payer: Self-pay

## 2020-07-13 DIAGNOSIS — G894 Chronic pain syndrome: Secondary | ICD-10-CM | POA: Diagnosis not present

## 2020-07-13 DIAGNOSIS — M25512 Pain in left shoulder: Secondary | ICD-10-CM

## 2020-07-13 DIAGNOSIS — G8929 Other chronic pain: Secondary | ICD-10-CM

## 2020-07-13 DIAGNOSIS — F17210 Nicotine dependence, cigarettes, uncomplicated: Secondary | ICD-10-CM | POA: Diagnosis not present

## 2020-07-13 NOTE — Progress Notes (Signed)
PROCEDURE NOTE:  The patient request injection, verbal consent was obtained.  The left shoulder was prepped appropriately after time out was performed.   Sterile technique was observed and injection of 1 cc of Celestone 6 mg with several cc's of plain xylocaine. Anesthesia was provided by ethyl chloride and a 20-gauge needle was used to inject the shoulder area. A posterior approach was used.  The injection was tolerated well.  A band aid dressing was applied.  The patient was advised to apply ice later today and tomorrow to the injection sight as needed.  Return in one month.  I will set her up to see pain clinic for her.  The manufacturer of her pain medicine is beginning to stop making her narcotic.  She needs to have pain management find another type.  Electronically Signed Sanjuana Kava, MD 4/13/202210:41 AM

## 2020-07-19 ENCOUNTER — Telehealth: Payer: Self-pay

## 2020-07-19 NOTE — Telephone Encounter (Signed)
Received message from [place] in [place] on Friday that Clare Gandy (in home care) left a message about patient and requesting a return call. I was not in the office Monday to address his call, so I spoke with Dr. Luna Glasgow first thing this morning.  I spoke with Mr. Elgie Congo to inform him per Dr. Luna Glasgow that patient has been referred to pain management and she was given the number to call them. Mr. Elgie Congo stated that patient was a 10 on the pain scale, but that she continued to do things with no complaint except going outdoors for Mr. Elgie Congo. Stated to Mr. Elgie Congo that patient doesn't complain to Dr. Luna Glasgow that her pain is so bad like she has to him, but that Dr.Keeling is aware of her condition and complaints.

## 2020-07-20 ENCOUNTER — Ambulatory Visit (INDEPENDENT_AMBULATORY_CARE_PROVIDER_SITE_OTHER): Payer: Medicare Other | Admitting: Internal Medicine

## 2020-07-20 ENCOUNTER — Other Ambulatory Visit: Payer: Self-pay

## 2020-07-20 ENCOUNTER — Encounter: Payer: Self-pay | Admitting: Internal Medicine

## 2020-07-20 VITALS — BP 126/82 | HR 66 | Temp 97.2°F | Ht 60.0 in | Wt 157.8 lb

## 2020-07-20 DIAGNOSIS — F17210 Nicotine dependence, cigarettes, uncomplicated: Secondary | ICD-10-CM

## 2020-07-20 DIAGNOSIS — R918 Other nonspecific abnormal finding of lung field: Secondary | ICD-10-CM | POA: Diagnosis not present

## 2020-07-20 DIAGNOSIS — R0600 Dyspnea, unspecified: Secondary | ICD-10-CM | POA: Diagnosis not present

## 2020-07-20 DIAGNOSIS — J449 Chronic obstructive pulmonary disease, unspecified: Secondary | ICD-10-CM

## 2020-07-20 DIAGNOSIS — R0609 Other forms of dyspnea: Secondary | ICD-10-CM

## 2020-07-20 NOTE — Assessment & Plan Note (Addendum)
Active smoker / severe kyphosis  - PFT's  12/31/19   FEV1 1.87 (84 % ) ratio 0.84  p 14 % improvement from saba p albuterol prior to study with DLCO  17.65 (83%) corrects to 4.39 (94%)  for alv volume and FV curve min concavity    Clearly has CB from smoking but min copd/AB component so rec is for prn saba and stop smoking    Could also have chronic rhinitis / pnds contributing to cough > ent f/u locally per PCP

## 2020-07-20 NOTE — Assessment & Plan Note (Addendum)
Counseled re importance of smoking cessation but did not meet time criteria for separate billing    rec 3rd moderna as now 42 y old and chronic illness          Each maintenance medication was reviewed in detail including emphasizing most importantly the difference between maintenance and prns and under what circumstances the prns are to be triggered using an action plan format where appropriate.  Total time for H and P, chart review, counseling, reviewing approp saba device(s) and generating customized AVS unique to this office visit / same day charting = 25 min

## 2020-07-20 NOTE — Progress Notes (Signed)
[name], female    DOB: [birth year],    MRN: [id number]   Brief patient profile:  15 yowf active smoker with clinical dx of copd previously followed by Dr Luan Pulling referred to pulmonary clinic in [facility]  10/22/2019 by Leda Gauze NP   History of Present Illness / maint on ? elipta ?  10/22/2019  Pulmonary/ 1st office eval/[name]  Dyspnea: limited by feet /legs hurt x  years and uses either cane or walker  Cough: worse x 6-8 m esp when try to lie down > slt yellow mucus also in am  so usually sits up but also back prevents lying down flat  Sleep: not well due mostly due to pain  SABA use: none now, has albuterol  02 prn up to 2lpm  rec Continue your inhaler and work on hard to reduce if not eliminate all smoking  zpak should change the mucus back to clear Call us with the name of your inhaler   Please remember to go to the  x-ray department  for your tests - we will call you with the results when they are available      02/02/2020  f/u ov/[place] office/[name] re: still smoking/ gold 0 copd - still not clear what she takes at home as maint vs prns  Chief Complaint  Patient presents with  . Follow-up    nasal congestion, runny nose for couple weeks   Dyspnea:  50 ft slowed by legs  Cough: assoc with pnds  Sleeping: sleeping upright due to pain not breathing problems  SABA use: ? name 02: 2lpm, at bedtime and  Prn  rec zpak for nasal problems The main problem you have is smoking  - it's not too late to quit If breathing getting worse I'll be happy to re-evaluate you if you bring all your respiratory medications/ devices including your portable system No regular pulmonary follow up is needed    07/20/2020  f/u ov/[place] office/[name] re:  GOLD 0 still smoking/ no maint rx   Chief Complaint  Patient presents with  . Follow-up    Sinus drainage, clear in color for past year  Dyspnea:  Rides scooter when does [employer] - limited by legs > sob Cough: smoker's rattle,  clear mucus  Sleeping: sits up in chair to sleep x years  SABA use: total no more than  6 pffs per day, no maint rx  02: 2lpm hs / prn daytime  Covid status: vax x 2  Last one 11/2019     No obvious day to day or daytime variability or assoc   purulent sputum or mucus plugs or hemoptysis or cp or chest tightness, subjective wheeze or overt sinus or hb symptoms.   Sleeping as above  without nocturnal   exacerbation  of respiratory  c/o's or need for noct saba. Also denies any obvious fluctuation of symptoms with weather or environmental changes or other aggravating or alleviating factors except as outlined above   No unusual exposure hx or h/o childhood pna/ asthma or knowledge of premature birth.  Current Allergies, Complete Past Medical History, Past Surgical History, Family History, and Social History were reviewed in [employer] record.  ROS  The following are not active complaints unless bolded Hoarseness, sore throat, dysphagia, dental problems, itching, sneezing,  nasal congestion or discharge of excess mucus or purulent secretions, ear ache,   fever, chills, sweats, unintended wt loss or wt gain, classically pleuritic or exertional cp,  orthopnea  pnd or arm/hand swelling  or leg swelling, presyncope, palpitations, abdominal pain, anorexia, nausea, vomiting, diarrhea  or change in bowel habits or change in bladder habits, change in stools or change in urine, dysuria, hematuria,  rash, arthralgias, visual complaints, headache, numbness, weakness or ataxia or problems with walking/ uses cane  or coordination,  change in mood or  memory.        Current Meds  Medication Sig  . beta carotene w/minerals (OCUVITE) tablet Take 1 tablet by mouth daily.  . diclofenac Sodium (VOLTAREN) 1 % GEL apply FOUR grams TO affected joints AS NEEDED FOUR TIMES DAILY  . Docusate Sodium 100 MG capsule Take 100 mg by mouth 2 (two) times daily.  . DULoxetine (CYMBALTA) 20 MG capsule Take 20  mg by mouth 2 (two) times daily.  Marland Kitchen esomeprazole (NEXIUM) 40 MG capsule Take 1 capsule (40 mg total) by mouth daily before breakfast.  . hydrOXYzine (VISTARIL) 25 MG capsule Take 25 mg by mouth every 6 (six) hours as needed for anxiety.   Marland Kitchen levothyroxine (SYNTHROID) 125 MCG tablet Take 125 mcg by mouth daily.  Marland Kitchen linaclotide (LINZESS) 290 MCG CAPS capsule Take 290 mcg by mouth daily before breakfast.  . metoprolol tartrate (LOPRESSOR) 25 MG tablet TAKE 1 TABLET BY MOUTH TWICE DAILY  . Multiple Vitamins-Iron (MULTIVITAMINS WITH IRON) TABS Take 1 tablet by mouth daily.  Marland Kitchen oxyCODONE (ROXICODONE) 15 MG immediate release tablet TAKE 1 TABLET BY MOUTH FIVE TIMES DAILY AS NEEDED FOR PAIN - must last 30 DAYS  . polyethylene glycol (MIRALAX / GLYCOLAX) packet Take 17 g by mouth 2 (two) times daily before a meal.  . potassium chloride (MICRO-K) 10 MEQ CR capsule Take 20 mEq by mouth daily.  . pregabalin (LYRICA) 75 MG capsule Take 75 mg by mouth 3 (three) times daily.  . Prucalopride Succinate (MOTEGRITY) 2 MG TABS Take 2 mg by mouth daily.  . silver sulfADIAZINE (SILVADENE) 1 % cream APPLY TO THE AFFECTED AREA(S) TWICE DAILY UNTIL HEALED  . spironolactone (ALDACTONE) 25 MG tablet TAKE 1/2 TABLET BY MOUTH DAILY  . sucralfate (CARAFATE) 1 g tablet Take 1 g by mouth 4 (four) times daily.  . SUMAtriptan (IMITREX) 100 MG tablet TAKE 1 TABLET BY MOUTH AT ONSET OF HEADACHE. MAY REPEAT ONCE IN TWO HOURS. NO MORE THAN TWO TABLETS IN 24 HOURS  . topiramate (TOPAMAX) 100 MG tablet Take 50 mg by mouth 2 (two) times daily.  Marland Kitchen torsemide (DEMADEX) 20 MG tablet TAKE 4 TABLETS BY MOUTH EVERY DAY  . traZODone (DESYREL) 100 MG tablet Take 100 mg by mouth at bedtime.  . VENTOLIN HFA 108 (90 Base) MCG/ACT inhaler Inhale 1-2 puffs into the lungs every 6 (six) hours as needed for wheezing or shortness of breath.                         Past Medical History:  Diagnosis Date  . Back pain   . Depression   .  Fibromyalgia   . GERD (gastroesophageal reflux disease)   . Hypertension    diet control  . Hypothyroid   . IBS (irritable bowel syndrome)   . Migraines   . Obesity   . Osteoarthritis   . Sleep apnea        Objective:    07/20/2020        158  02/02/2020        161   02/02/20 161 lb 6.4 oz (73.2 kg)  12/16/19 176 lb (79.8 kg)  10/22/19 176 lb (79.8 kg)     Vital signs reviewed  07/20/2020  - Note at rest 02 sats  99 % on RA   General appearance:   Frail elderly wf >> stated aged / walks with cane      HEENT : pt wearing mask not removed for exam due to covid - 19 concerns.   NECK :  without JVD/Nodes/TM/ nl carotid upstrokes bilaterally   LUNGS: no acc muscle use,  Min barrel / mod severe kyphoscoliosis contour chest wall with bilateral  slightly decreased bs s audible wheeze and  without cough on insp or exp maneuvers and min  Hyperresonant  to  percussion bilaterally     CV:  RRR  no s3 or murmur or increase in P2, and no edema   ABD:  soft and nontender with pos end  insp Hoover's  in the supine position. No bruits or organomegaly appreciated, bowel sounds nl  MS:   Nl gait/  ext warm without deformities, calf tenderness, cyanosis or clubbing No obvious joint restrictions   SKIN: warm and dry without lesions    NEURO:  alert, approp, nl sensorium with  no motor or cerebellar deficits apparent.        CXR PA and Lateral:   07/20/2020 :    I personally reviewed images and agree with radiology impression as follows:    >> did not go for cxr as rec          Assessment

## 2020-07-20 NOTE — Patient Instructions (Addendum)
I very strongly recommend you get the 3rd  moderna  vaccine as soon as possible based on your risk of dying from the virus  and the proven safety and benefit of these vaccines against even the delta and omicron variants.  This can save your life as well as  those of your loved ones,  especially if they are also not vaccinated.   The key is to stop smoking completely before smoking completely stops you!  Please remember to go to the  x-ray department  @  [facility] for your tests - we will call you with the results when they are available     Ask your PCP to refer you to a local  ENT (sinus doctor)  Follow up here is as needed

## 2020-07-20 NOTE — Assessment & Plan Note (Signed)
See cxr 10/22/19   rec repeat cxr > did not go as rec

## 2020-08-03 ENCOUNTER — Telehealth: Payer: Self-pay | Admitting: Orthopaedic Surgery

## 2020-08-03 MED ORDER — OXYCODONE HCL 15 MG PO TABS: ORAL_TABLET | ORAL | 0 refills | Status: DC

## 2020-08-03 NOTE — Telephone Encounter (Signed)
Patient requests Oxycodone 15 mgs.  Qty  140   Sig: TAKE 1 TABLET BY MOUTH FIVE TIMES DAILY AS NEEDED FOR PAIN - must last 30 DAYS   Patient uses [facility] 

## 2020-08-10 ENCOUNTER — Other Ambulatory Visit: Payer: Self-pay

## 2020-08-10 ENCOUNTER — Encounter: Payer: Self-pay | Admitting: Orthopaedic Surgery

## 2020-08-10 ENCOUNTER — Ambulatory Visit (INDEPENDENT_AMBULATORY_CARE_PROVIDER_SITE_OTHER): Payer: Medicare Other | Admitting: Orthopaedic Surgery

## 2020-08-10 DIAGNOSIS — G8929 Other chronic pain: Secondary | ICD-10-CM | POA: Diagnosis not present

## 2020-08-10 DIAGNOSIS — F17210 Nicotine dependence, cigarettes, uncomplicated: Secondary | ICD-10-CM | POA: Diagnosis not present

## 2020-08-10 DIAGNOSIS — G894 Chronic pain syndrome: Secondary | ICD-10-CM | POA: Diagnosis not present

## 2020-08-10 DIAGNOSIS — M25512 Pain in left shoulder: Secondary | ICD-10-CM | POA: Diagnosis not present

## 2020-08-10 NOTE — Progress Notes (Signed)
PROCEDURE NOTE:  The patient request injection, verbal consent was obtained.  The left shoulder was prepped appropriately after time out was performed.   Sterile technique was observed and injection of 1 cc of Celestone 6 mg with several cc's of plain xylocaine. Anesthesia was provided by ethyl chloride and a 20-gauge needle was used to inject the shoulder area. A posterior approach was used.  The injection was tolerated well.  A band aid dressing was applied.  The patient was advised to apply ice later today and tomorrow to the injection sight as needed.  She has more pain.  She has started going to the pain clinic.  We will need to contact them for update.  Return in one month.  Call if any problem.  Precautions discussed.   Electronically Signed Sanjuana Kava, MD 5/11/202210:51 AM

## 2020-08-10 NOTE — Patient Instructions (Signed)

## 2020-08-24 ENCOUNTER — Other Ambulatory Visit: Payer: Self-pay | Admitting: Cardiology

## 2020-09-01 ENCOUNTER — Telehealth: Payer: Self-pay | Admitting: Internal Medicine

## 2020-09-01 ENCOUNTER — Telehealth: Payer: Self-pay | Admitting: Orthopaedic Surgery

## 2020-09-01 MED ORDER — OXYCODONE HCL 15 MG PO TABS: ORAL_TABLET | ORAL | 0 refills | Status: DC

## 2020-09-01 NOTE — Telephone Encounter (Signed)
Called and spoke with pt and she stated that she dropped the [facility] form off at the office and she wanted to let MW know this will need to be faxed  Back to the [facility].  Will forward to [place] and MW to follow up on.

## 2020-09-01 NOTE — Telephone Encounter (Signed)
Called and spoke with patient who stated she needed Medical forms from [facility] reprinted and filled out since [facility] is stating they didn't get the original forms via fax or from when patient mailed them. Writer printed forms out and spoke with DMV to confirm they were the right forms. Dr Melvyn Novas filled out Pulmonary section and writer called patient to update. Patient stated to send by mail the filled out packet so she can get the rest done. Copy made of Dr Gustavus Bryant section and kept in [place] folder just in case and original paperwork sent out by mail to patient per patient request. Printed sections that were scanned in by other providers per patient request and sent to patient in same envelope. Patient address confirmed. Patient advised to please call back if anything else is needed. Patient stated nothing further is needed at this time.

## 2020-09-01 NOTE — Telephone Encounter (Signed)
Patient requests Oxycodone 15 mgs.  Qty  140  Sig: TAKE 1 TABLET BY MOUTH FIVE TIMES DAILY AS NEEDED FOR PAIN - must last 30 DAYS  Patient uses [employer], pt says she thinks she is being shorted pills everytime she gets them.  If taken according to directions, she should get 150 pills.  She is only getting 140 pills.

## 2020-09-07 ENCOUNTER — Other Ambulatory Visit: Payer: Self-pay

## 2020-09-07 ENCOUNTER — Ambulatory Visit (INDEPENDENT_AMBULATORY_CARE_PROVIDER_SITE_OTHER): Payer: Medicare Other | Admitting: Orthopaedic Surgery

## 2020-09-07 ENCOUNTER — Encounter: Payer: Self-pay | Admitting: Orthopaedic Surgery

## 2020-09-07 DIAGNOSIS — G8929 Other chronic pain: Secondary | ICD-10-CM | POA: Diagnosis not present

## 2020-09-07 DIAGNOSIS — M25512 Pain in left shoulder: Secondary | ICD-10-CM

## 2020-09-07 DIAGNOSIS — F17210 Nicotine dependence, cigarettes, uncomplicated: Secondary | ICD-10-CM

## 2020-09-07 DIAGNOSIS — G894 Chronic pain syndrome: Secondary | ICD-10-CM

## 2020-09-07 NOTE — Progress Notes (Signed)
PROCEDURE NOTE:  The patient request injection, verbal consent was obtained.  The left shoulder was prepped appropriately after time out was performed.   Sterile technique was observed and injection of 1 cc of DepoMedrol 40mg  with several cc's of plain xylocaine. Anesthesia was provided by ethyl chloride and a 20-gauge needle was used to inject the shoulder area. A posterior approach was used.  The injection was tolerated well.  A band aid dressing was applied.  The patient was advised to apply ice later today and tomorrow to the injection sight as needed.   She says she was told by family doctor to go to oncology for evaluation of possible leukemia.  I will see her in one month.  Call if any problem.  Precautions discussed.  Electronically Signed Sanjuana Kava, MD 6/8/202210:51 AM

## 2020-09-19 ENCOUNTER — Other Ambulatory Visit (HOSPITAL_COMMUNITY): Payer: Self-pay | Admitting: Surgery

## 2020-09-19 DIAGNOSIS — D472 Monoclonal gammopathy: Secondary | ICD-10-CM

## 2020-09-19 DIAGNOSIS — M899 Disorder of bone, unspecified: Secondary | ICD-10-CM

## 2020-09-19 DIAGNOSIS — D518 Other vitamin B12 deficiency anemias: Secondary | ICD-10-CM

## 2020-09-19 DIAGNOSIS — D509 Iron deficiency anemia, unspecified: Secondary | ICD-10-CM

## 2020-09-20 ENCOUNTER — Other Ambulatory Visit (HOSPITAL_COMMUNITY): Payer: Medicare Other

## 2020-09-28 ENCOUNTER — Ambulatory Visit (HOSPITAL_COMMUNITY): Payer: Medicare Other | Admitting: Physician Assistant

## 2020-09-29 ENCOUNTER — Telehealth: Payer: Self-pay | Admitting: Orthopaedic Surgery

## 2020-09-29 MED ORDER — OXYCODONE HCL 15 MG PO TABS: ORAL_TABLET | ORAL | 0 refills | Status: DC

## 2020-09-29 NOTE — Telephone Encounter (Signed)
Patient requests Oxycodone 15 mgs.  Qty  140   Sig: TAKE 1 TABLET BY MOUTH FIVE TIMES DAILY AS NEEDED FOR PAIN - must last 30 DAYS   Patient uses [facility]

## 2020-10-07 DIAGNOSIS — C801 Malignant (primary) neoplasm, unspecified: Secondary | ICD-10-CM | POA: Insufficient documentation

## 2020-10-19 ENCOUNTER — Ambulatory Visit (INDEPENDENT_AMBULATORY_CARE_PROVIDER_SITE_OTHER): Payer: Medicare Other | Admitting: Orthopaedic Surgery

## 2020-10-19 ENCOUNTER — Encounter: Payer: Self-pay | Admitting: Orthopaedic Surgery

## 2020-10-19 ENCOUNTER — Other Ambulatory Visit: Payer: Self-pay

## 2020-10-19 DIAGNOSIS — G8929 Other chronic pain: Secondary | ICD-10-CM | POA: Diagnosis not present

## 2020-10-19 DIAGNOSIS — G894 Chronic pain syndrome: Secondary | ICD-10-CM | POA: Diagnosis not present

## 2020-10-19 DIAGNOSIS — F17210 Nicotine dependence, cigarettes, uncomplicated: Secondary | ICD-10-CM

## 2020-10-19 DIAGNOSIS — M25512 Pain in left shoulder: Secondary | ICD-10-CM

## 2020-10-19 NOTE — Progress Notes (Signed)
PROCEDURE NOTE:  The patient request injection, verbal consent was obtained.  The left shoulder was prepped appropriately after time out was performed.   Sterile technique was observed and injection of 1 cc of Celestone 6 mg with several cc's of plain xylocaine. Anesthesia was provided by ethyl chloride and a 20-gauge needle was used to inject the shoulder area. A posterior approach was used.  The injection was tolerated well.  A band aid dressing was applied.  The patient was advised to apply ice later today and tomorrow to the injection sight as needed.   Return in one month.  Call if any problem.  Precautions discussed.  Electronically Signed Sanjuana Kava, MD 7/20/202210:44 AM

## 2020-11-01 ENCOUNTER — Telehealth: Payer: Self-pay | Admitting: Orthopaedic Surgery

## 2020-11-01 NOTE — Telephone Encounter (Signed)
Patient requests Oxycodone 15 mgs.  Qty  140   Sig: TAKE 1 TABLET BY MOUTH FIVE TIMES DAILY AS NEEDED FOR PAIN - must last 30 DAYS   Patient uses [facility]

## 2020-11-02 MED ORDER — OXYCODONE HCL 15 MG PO TABS: ORAL_TABLET | ORAL | 0 refills | Status: DC

## 2020-11-16 ENCOUNTER — Ambulatory Visit: Payer: Medicare Other | Admitting: Orthopaedic Surgery

## 2020-11-30 ENCOUNTER — Encounter: Payer: Self-pay | Admitting: Orthopaedic Surgery

## 2020-11-30 ENCOUNTER — Other Ambulatory Visit: Payer: Self-pay

## 2020-11-30 ENCOUNTER — Ambulatory Visit (INDEPENDENT_AMBULATORY_CARE_PROVIDER_SITE_OTHER): Payer: Medicare Other | Admitting: Orthopaedic Surgery

## 2020-11-30 DIAGNOSIS — G894 Chronic pain syndrome: Secondary | ICD-10-CM | POA: Diagnosis not present

## 2020-11-30 DIAGNOSIS — G8929 Other chronic pain: Secondary | ICD-10-CM | POA: Diagnosis not present

## 2020-11-30 DIAGNOSIS — M25512 Pain in left shoulder: Secondary | ICD-10-CM | POA: Diagnosis not present

## 2020-11-30 DIAGNOSIS — F17210 Nicotine dependence, cigarettes, uncomplicated: Secondary | ICD-10-CM

## 2020-11-30 MED ORDER — OXYCODONE HCL 15 MG PO TABS: ORAL_TABLET | ORAL | 0 refills | Status: DC

## 2020-11-30 NOTE — Progress Notes (Signed)
PROCEDURE NOTE:  The patient request injection, verbal consent was obtained.  The left shoulder was prepped appropriately after time out was performed.   Sterile technique was observed and injection of 1 cc of Celestone 6 mg with several cc's of plain xylocaine. Anesthesia was provided by ethyl chloride and a 20-gauge needle was used to inject the shoulder area. A posterior approach was used.  The injection was tolerated well.  A band aid dressing was applied.  The patient was advised to apply ice later today and tomorrow to the injection sight as needed.   I have reviewed the [place] web site prior to prescribing narcotic medicine for this patient.  Return in one month.  Call if any problem.  Precautions discussed.  Electronically Signed Sanjuana Kava, MD 8/31/202210:32 AM

## 2020-12-28 ENCOUNTER — Ambulatory Visit (INDEPENDENT_AMBULATORY_CARE_PROVIDER_SITE_OTHER): Payer: Medicare Other | Admitting: Orthopaedic Surgery

## 2020-12-28 ENCOUNTER — Encounter: Payer: Self-pay | Admitting: Orthopaedic Surgery

## 2020-12-28 DIAGNOSIS — G8929 Other chronic pain: Secondary | ICD-10-CM

## 2020-12-28 DIAGNOSIS — M25512 Pain in left shoulder: Secondary | ICD-10-CM

## 2020-12-28 DIAGNOSIS — G894 Chronic pain syndrome: Secondary | ICD-10-CM

## 2020-12-28 DIAGNOSIS — F17210 Nicotine dependence, cigarettes, uncomplicated: Secondary | ICD-10-CM | POA: Diagnosis not present

## 2020-12-28 MED ORDER — OXYCODONE HCL 15 MG PO TABS: ORAL_TABLET | ORAL | 0 refills | Status: DC

## 2020-12-28 NOTE — Progress Notes (Signed)
PROCEDURE NOTE:  The patient request injection, verbal consent was obtained.  The left shoulder was prepped appropriately after time out was performed.   Sterile technique was observed and injection of 1 cc of Celestone 6 mg with several cc's of plain xylocaine. Anesthesia was provided by ethyl chloride and a 20-gauge needle was used to inject the shoulder area. A posterior approach was used.  The injection was tolerated well.  A band aid dressing was applied.  The patient was advised to apply ice later today and tomorrow to the injection sight as needed.  Return in one month.  I have reviewed the [place] web site prior to prescribing narcotic medicine for this patient.  I refilled her pain medicine.  Call if any problem.  Precautions discussed.  Electronically Signed Sanjuana Kava, MD 9/28/202210:24 AM

## 2020-12-29 ENCOUNTER — Telehealth: Payer: Self-pay | Admitting: Cardiology

## 2020-12-29 ENCOUNTER — Encounter: Payer: Self-pay | Admitting: Cardiology

## 2020-12-29 ENCOUNTER — Ambulatory Visit (INDEPENDENT_AMBULATORY_CARE_PROVIDER_SITE_OTHER): Payer: Medicare Other | Admitting: Cardiology

## 2020-12-29 VITALS — Ht 60.0 in | Wt 146.0 lb

## 2020-12-29 DIAGNOSIS — R600 Localized edema: Secondary | ICD-10-CM | POA: Diagnosis not present

## 2020-12-29 DIAGNOSIS — I10 Essential (primary) hypertension: Secondary | ICD-10-CM | POA: Diagnosis not present

## 2020-12-29 NOTE — Progress Notes (Signed)
Virtual Visit via Telephone Note   This visit type was conducted due to national recommendations for restrictions regarding the COVID-19 Pandemic (e.g. social distancing) in an effort to limit this patient's exposure and mitigate transmission in our community.  Due to her co-morbid illnesses, this patient is at least at moderate risk for complications without adequate follow up.  This format is felt to be most appropriate for this patient at this time.  The patient did not have access to video technology/had technical difficulties with video requiring transitioning to audio format only (telephone).  All issues noted in this document were discussed and addressed.  No physical exam could be performed with this format.  Please refer to the patient's chart for her  consent to telehealth for [facility].    Date:  12/29/2020   ID:  [name], DOB January 25, 1961, MRN [id number] The patient was identified using 2 identifiers.  Patient Location: Home Provider Location: Office/Clinic   PCP:  Neale Burly, MD   [facility] Providers Cardiologist:  Carlyle Dolly, MD     Evaluation Performed:  Follow-Up Visit  Chief Complaint:  Follow up  History of Present Illness:    [name] is a 60 y.o. female seen today for follow up of the following medical problems.    1. LE edema 09/2018 echo LVEF 60-65%, normal RV function.    - weight today 172 lbs. Down from 173 lbs last visit. Significant downtrend over the last few months - she is on toresemide 80mg  daily, has done well on  - 6/16 labs showed Cr 0.71 BUN 9 K 3.7 - swelling improved, but can be somewhat up and down.   - swelling improving, can have some weeping areas of fluid - ongoing significant weight loss, down to 146 lbs.  - taking torsemide 80mg  once daily, aldactone 12.5mg  daily  2. Chest pain CAD risk factors: HTN, diet controlled DM2 previously on insulin, +tobacco x 25 years. Mother "heart troubles" in her mid  60s, multiple siblings with heart troubles at early age.   - echo 10/2016 LVEF 60-65%.    - no showed for lexiscan 01/2017  - 09/2018 echo LVEF 60-65%, normal RV function.    - was to have repeat stress test in 09/2018. Episode of syncope and SOB with wearing a facemask, seen in ER without evidence of ACS. She refused to reschedule test - last visit with PA Strader emperic trial of imdur - headaches on imdur, she stopped taking. We changed to norvasc 2.5mg  daily        - symptoms resolved with restarting lopressor.  - no recent symptoms   3. HTN - low bp's last visit with PA Leonides Sake, norvasc was stopped      The patient does not have symptoms concerning for COVID-19 infection (fever, chills, cough, or new shortness of breath).    Past Medical History:  Diagnosis Date   Back pain    Depression    Fibromyalgia    GERD (gastroesophageal reflux disease)    Hypertension    diet control   Hypothyroid    IBS (irritable bowel syndrome)    Migraines    Obesity    Osteoarthritis    Sleep apnea    Past Surgical History:  Procedure Laterality Date   AMPUTATION  12/13/2011   Procedure: AMPUTATION DIGIT;  Surgeon: Marcheta Grammes, DPM;  Location: [facility];  Service: Orthopedics;  Laterality: Right;  amputation second toe right foot   BACK  SURGERY     BIOPSY  12/23/2015   Procedure: BIOPSY;  Surgeon: Rogene Houston, MD;  Location: [facility];  Service: Endoscopy;;  pre pyloric patches   BREAST BIOPSY     CHOLECYSTECTOMY     CYSTOSCOPY W/ URETERAL STENT PLACEMENT Left 10/09/2017   Procedure: CYSTOSCOPY WITH RETROGRADE PYELOGRAM/URETERAL STENT PLACEMENT;  Surgeon: Cleon Gustin, MD;  Location: [facility];  Service: Urology;  Laterality: Left;   CYSTOSCOPY WITH RETROGRADE PYELOGRAM, URETEROSCOPY AND STENT PLACEMENT Left 10/23/2017   Procedure: CYSTOSCOPY WITH LEFT RETROGRADE PYELOGRAM, LEFT URETEROSCOPY AND STENT EXCHANGE;  Surgeon: Cleon Gustin, MD;  Location: [facility];   Service: Urology;  Laterality: Left;   ESOPHAGOGASTRODUODENOSCOPY  05/28/2011   Procedure: ESOPHAGOGASTRODUODENOSCOPY (EGD);  Surgeon: Rogene Houston, MD;  Location: [facility];  Service: Endoscopy;  Laterality: N/A;  730   ESOPHAGOGASTRODUODENOSCOPY N/A 10/09/2013   Procedure: ESOPHAGOGASTRODUODENOSCOPY (EGD);  Surgeon: Rogene Houston, MD;  Location: [facility];  Service: Endoscopy;  Laterality: N/A;  730   ESOPHAGOGASTRODUODENOSCOPY (EGD) WITH PROPOFOL N/A 12/23/2015   Procedure: ESOPHAGOGASTRODUODENOSCOPY (EGD) WITH PROPOFOL;  Surgeon: Rogene Houston, MD;  [facility];  Service: Endoscopy;  Laterality: N/A;   HERNIA REPAIR     HOLMIUM LASER APPLICATION Left 05/31/6008   Procedure: LEFT URETEROSCOPY WITH HOLMIUM LASER LITHOTRIPSY;  Surgeon: Cleon Gustin, MD;  Location: [facility];  Service: Urology;  Laterality: Left;   MALONEY DILATION N/A 10/09/2013   Procedure: Venia Minks DILATION;  Surgeon: Rogene Houston, MD;  Location: [facility];  Service: Endoscopy;  Laterality: N/A;   NASAL SEPTUM SURGERY     NECK SURGERY  1998, 2008   2 yrs ago   TOTAL ABDOMINAL HYSTERECTOMY     Left oophroectomy for a lare tumor about 21 yrs. RT ovary removed time of hysterectomy     No outpatient medications have been marked as taking for the 12/29/20 encounter (Appointment) with [name], MD.     Allergies:   Cephalexin, Latex, Betadine [povidone iodine], Cleocin [clindamycin hcl], Gentamicin, Other, Sulfa antibiotics, and Toradol [ketorolac tromethamine]   Social History   Tobacco Use   Smoking status: Every Day    Packs/day: 0.25    Years: 35.00    Pack years: 8.75    Types: Cigarettes   Smokeless tobacco: Never   Tobacco comments:    smokes 3 cigarettes per day 07/20/2020  Vaping Use   Vaping Use: Never used  Substance Use Topics   Alcohol use: No   Drug use: No     Family Hx: The patient's family history includes CAD in her mother; Cancer in her mother and  sister; Hypertension in her brother; Liver disease in her brother; Obesity in her brother.  ROS:   Please see the history of present illness.     All other systems reviewed and are negative.   Prior CV studies:   The following studies were reviewed today:    Labs/Other Tests and Data Reviewed:    EKG:  No ECG reviewed.  Recent Labs: No results found for requested labs within last 8760 hours.   Recent Lipid Panel Lab Results  Component Value Date/Time   CHOL  10/16/2006 01:39 AM    145        ATP III CLASSIFICATION:  <200     mg/dL   Desirable  200-239  mg/dL   Borderline High  >=240    mg/dL   High   TRIG 408 (H)  10/16/2006 01:39 AM   HDL <10 (L) 10/16/2006 01:39 AM   CHOLHDL NOT CALCULATED 10/16/2006 01:39 AM   LDLCALC  10/16/2006 01:39 AM    UNABLE TO CALCULATE IF TRIGLYCERIDE OVER 400 mg/dL        Total Cholesterol/HDL:CHD Risk Coronary Heart Disease Risk Table                     Men   Women  1/2 Average Risk   3.4   3.3    Wt Readings from Last 3 Encounters:  07/20/20 157 lb 12.8 oz (71.6 kg)  06/24/20 163 lb 9.6 oz (74.2 kg)  02/02/20 161 lb 6.4 oz (73.2 kg)     Risk Assessment/Calculations:          Objective:    Vital Signs:   Today's Vitals   12/29/20 1452  Weight: 146 lb (66.2 kg)  Height: 5' (1.524 m)   Body mass index is 28.51 kg/m. Normal affect. Normal speech pattern and tone. Comfortable, no apparent distress. No audible signs of sob or wheezing.   ASSESSMENT & PLAN:    LE edema Has done well on toresmide 80mg  daily, weights continue to come down significantly over time - continue current diuretic  2. HTN - more recent issues with low bp's related to significant weight loss, bp's back to normal off norvac - continue to monitor.         COVID-19 Education: The signs and symptoms of COVID-19 were discussed with the patient and how to seek care for testing (follow up with PCP or arrange E-visit).  The importance of social  distancing was discussed today.  Time:   Today, I have spent 23 minutes with the patient with telehealth technology discussing the above problems.     Medication Adjustments/Labs and Tests Ordered: Current medicines are reviewed at length with the patient today.  Concerns regarding medicines are outlined above.   Tests Ordered: No orders of the defined types were placed in this encounter.   Medication Changes: No orders of the defined types were placed in this encounter.   Follow Up:  In Person 4 months  Signed, Carlyle Dolly, MD  12/29/2020 1:07 PM    [place]

## 2020-12-29 NOTE — Telephone Encounter (Signed)
  Patient Consent for Virtual Visit    CONSENT FOR VIRTUAL VISIT FOR:  [name]  By participating in this virtual visit I agree to the following:  I hereby voluntarily request, consent and authorize [facility] and its employed or contracted physicians, Engineer, materials, nurse practitioners or other licensed health care professionals (the Practitioner), to provide me with telemedicine health care services (the "Services") as deemed necessary by the treating Practitioner. I acknowledge and consent to receive the Services by the Practitioner via telemedicine. I understand that the telemedicine visit will involve communicating with the Practitioner through live audiovisual communication technology and the disclosure of certain medical information by electronic transmission. I acknowledge that I have been given the opportunity to request an in-person assessment or other available alternative prior to the telemedicine visit and am voluntarily participating in the telemedicine visit.  I understand that I have the right to withhold or withdraw my consent to the use of telemedicine in the course of my care at any time, without affecting my right to future care or treatment, and that the Practitioner or I may terminate the telemedicine visit at any time. I understand that I have the right to inspect all information obtained and/or recorded in the course of the telemedicine visit and may receive copies of available information for a reasonable fee.  I understand that some of the potential risks of receiving the Services via telemedicine include:  Delay or interruption in medical evaluation due to technological equipment failure or disruption; Information transmitted may not be sufficient (e.g. poor resolution of images) to allow for appropriate medical decision making by the Practitioner; and/or  In rare instances, security protocols could fail, causing a breach of personal health  information.  Furthermore, I acknowledge that it is my responsibility to provide information about my medical history, conditions and care that is complete and accurate to the best of my ability. I acknowledge that Practitioner's advice, recommendations, and/or decision may be based on factors not within their control, such as incomplete or inaccurate data provided by me or distortions of diagnostic images or specimens that may result from electronic transmissions. I understand that the practice of medicine is not an exact science and that Practitioner makes no warranties or guarantees regarding treatment outcomes. I acknowledge that a copy of this consent can be made available to me via my patient portal ([place]), or I can request a printed copy by calling the office of [facility].    I understand that my insurance will be billed for this visit.   I have read or had this consent read to me. I understand the contents of this consent, which adequately explains the benefits and risks of the Services being provided via telemedicine.  I have been provided ample opportunity to ask questions regarding this consent and the Services and have had my questions answered to my satisfaction. I give my informed consent for the services to be provided through the use of telemedicine in my medical care

## 2020-12-29 NOTE — Patient Instructions (Signed)
Medication Instructions:  Continue all current medications.  Labwork: none  Testing/Procedures: none  Follow-Up: 4 months   Any Other Special Instructions Will Be Listed Below (If Applicable).  If you need a refill on your cardiac medications before your next appointment, please call your pharmacy.\ 

## 2021-01-19 ENCOUNTER — Telehealth: Payer: Self-pay

## 2021-01-19 MED ORDER — OXYCODONE HCL 15 MG PO TABS: ORAL_TABLET | ORAL | 0 refills | Status: DC

## 2021-01-19 NOTE — Telephone Encounter (Signed)
Oxycodone-15 mg  Qty 145 Tablets   PATIENT USES [facility]

## 2021-01-25 ENCOUNTER — Telehealth: Payer: Self-pay | Admitting: Orthopaedic Surgery

## 2021-01-25 ENCOUNTER — Ambulatory Visit: Payer: Medicare Other | Admitting: Orthopaedic Surgery

## 2021-01-25 NOTE — Telephone Encounter (Signed)
Patient called to inquire about her pain medication refill:  Called back to patient to relay per chart note, done by Dr Luna Glasgow on 01/19/21.

## 2021-02-08 ENCOUNTER — Ambulatory Visit (INDEPENDENT_AMBULATORY_CARE_PROVIDER_SITE_OTHER): Payer: Medicare Other | Admitting: Orthopaedic Surgery

## 2021-02-08 ENCOUNTER — Encounter: Payer: Self-pay | Admitting: Orthopaedic Surgery

## 2021-02-08 DIAGNOSIS — F17210 Nicotine dependence, cigarettes, uncomplicated: Secondary | ICD-10-CM | POA: Diagnosis not present

## 2021-02-08 DIAGNOSIS — G894 Chronic pain syndrome: Secondary | ICD-10-CM | POA: Diagnosis not present

## 2021-02-08 DIAGNOSIS — G8929 Other chronic pain: Secondary | ICD-10-CM

## 2021-02-08 DIAGNOSIS — M25512 Pain in left shoulder: Secondary | ICD-10-CM

## 2021-02-08 NOTE — Progress Notes (Signed)
PROCEDURE NOTE:  The patient request injection, verbal consent was obtained.  The left shoulder was prepped appropriately after time out was performed.   Sterile technique was observed and injection of 1 cc of DepoMedrol 40mg  with several cc's of plain xylocaine. Anesthesia was provided by ethyl chloride and a 20-gauge needle was used to inject the shoulder area. A posterior approach was used.  The injection was tolerated well.  A band aid dressing was applied.  The patient was advised to apply ice later today and tomorrow to the injection sight as needed.   Encounter Diagnoses  Name Primary?   Chronic left shoulder pain Yes   Pain syndrome, chronic    Cigarette nicotine dependence without complication    Return in one month.  Forms for DMV completed.  Call if any problem.  Precautions discussed.  Electronically Signed Sanjuana Kava, MD 11/9/202210:43 AM

## 2021-02-27 ENCOUNTER — Telehealth: Payer: Self-pay | Admitting: Radiology

## 2021-02-27 MED ORDER — OXYCODONE HCL 15 MG PO TABS: ORAL_TABLET | ORAL | 0 refills | Status: DC

## 2021-02-27 NOTE — Telephone Encounter (Signed)
Patient called and asked for refill oxycodone.    [facility].

## 2021-03-08 ENCOUNTER — Ambulatory Visit: Payer: Medicare Other | Admitting: Orthopaedic Surgery

## 2021-03-17 ENCOUNTER — Encounter: Payer: Self-pay | Admitting: Cardiology

## 2021-03-22 ENCOUNTER — Encounter: Payer: Self-pay | Admitting: Orthopaedic Surgery

## 2021-03-22 ENCOUNTER — Ambulatory Visit (INDEPENDENT_AMBULATORY_CARE_PROVIDER_SITE_OTHER): Payer: Medicare Other | Admitting: Orthopaedic Surgery

## 2021-03-22 DIAGNOSIS — F17210 Nicotine dependence, cigarettes, uncomplicated: Secondary | ICD-10-CM | POA: Diagnosis not present

## 2021-03-22 DIAGNOSIS — M25512 Pain in left shoulder: Secondary | ICD-10-CM | POA: Diagnosis not present

## 2021-03-22 DIAGNOSIS — G8929 Other chronic pain: Secondary | ICD-10-CM

## 2021-03-22 DIAGNOSIS — G894 Chronic pain syndrome: Secondary | ICD-10-CM | POA: Diagnosis not present

## 2021-03-22 MED ORDER — OXYCODONE HCL 15 MG PO TABS: ORAL_TABLET | ORAL | 0 refills | Status: DC

## 2021-03-22 NOTE — Patient Instructions (Signed)

## 2021-03-22 NOTE — Progress Notes (Signed)
PROCEDURE NOTE:  The patient request injection, verbal consent was obtained.  The left shoulder was prepped appropriately after time out was performed.   Sterile technique was observed and injection of 1 cc of DepoMedrol 40mg  with several cc's of plain xylocaine. Anesthesia was provided by ethyl chloride and a 20-gauge needle was used to inject the shoulder area. A posterior approach was used.  The injection was tolerated well.  A band aid dressing was applied.  The patient was advised to apply ice later today and tomorrow to the injection sight as needed.   Encounter Diagnoses  Name Primary?   Chronic left shoulder pain Yes   Pain syndrome, chronic    Cigarette nicotine dependence without complication    She is planning to visit her niece in [place] for holidays.  I will refill medicine early and ask pharmacy to fill early.  She has not been out of town in many, many years and getting out to see family should be good for her mentally.  I have reviewed the [place] web site prior to prescribing narcotic medicine for this patient.  Return in one month.  Call if any problem.  Precautions discussed.  Electronically [place], MD 12/21/202211:03 AM

## 2021-04-19 ENCOUNTER — Ambulatory Visit: Payer: Medicare Other | Admitting: Orthopaedic Surgery

## 2021-04-24 ENCOUNTER — Telehealth: Payer: Self-pay | Admitting: Orthopaedic Surgery

## 2021-04-24 MED ORDER — OXYCODONE HCL 15 MG PO TABS: ORAL_TABLET | ORAL | 0 refills | Status: DC

## 2021-04-24 NOTE — Telephone Encounter (Signed)
Patient called requesting refill for her pain medicine.    oxyCODONE (ROXICODONE) 15 MG immediate release tablet  Pharmacy  [facility]

## 2021-05-02 ENCOUNTER — Ambulatory Visit: Payer: Medicare Other | Admitting: Family Medicine

## 2021-05-03 ENCOUNTER — Encounter: Payer: Self-pay | Admitting: Orthopaedic Surgery

## 2021-05-03 ENCOUNTER — Ambulatory Visit (INDEPENDENT_AMBULATORY_CARE_PROVIDER_SITE_OTHER): Payer: Medicare Other | Admitting: Orthopaedic Surgery

## 2021-05-03 ENCOUNTER — Other Ambulatory Visit: Payer: Self-pay

## 2021-05-03 DIAGNOSIS — M25512 Pain in left shoulder: Secondary | ICD-10-CM | POA: Diagnosis not present

## 2021-05-03 DIAGNOSIS — G894 Chronic pain syndrome: Secondary | ICD-10-CM

## 2021-05-03 DIAGNOSIS — G8929 Other chronic pain: Secondary | ICD-10-CM | POA: Diagnosis not present

## 2021-05-03 NOTE — Progress Notes (Signed)
PROCEDURE NOTE  The left shoulder was prepped appropriately after time out was performed.   Sterile technique was observed and injection of 1 cc of DepoMedrol 40mg  with several cc's of plain xylocaine. Anesthesia was provided by ethyl chloride and a 20-gauge needle was used to inject the shoulder area. A posterior approach was used.  The injection was tolerated well.  A band aid dressing was applied.  The patient was advised to apply ice later today and tomorrow to the injection sight as needed.  She got to go to [place] for Christmas and enjoyed her visit.  Encounter Diagnoses  Name Primary?   Chronic left shoulder pain Yes   Pain syndrome, chronic    Return in one month.  Call if any problem.  Precautions discussed.  Electronically Signed Sanjuana Kava, MD 2/1/202311:42 AM

## 2021-05-24 ENCOUNTER — Telehealth: Payer: Self-pay

## 2021-05-24 NOTE — Telephone Encounter (Signed)
Oxycodone 15MG   Qty 145 Tablets  PATIENT USES [name]

## 2021-05-25 MED ORDER — OXYCODONE HCL 15 MG PO TABS: ORAL_TABLET | ORAL | 0 refills | Status: DC

## 2021-05-31 ENCOUNTER — Ambulatory Visit: Payer: Medicare Other | Admitting: Orthopaedic Surgery

## 2021-06-14 ENCOUNTER — Encounter: Payer: Self-pay | Admitting: Orthopaedic Surgery

## 2021-06-14 ENCOUNTER — Ambulatory Visit (INDEPENDENT_AMBULATORY_CARE_PROVIDER_SITE_OTHER): Payer: Medicare Other | Admitting: Orthopaedic Surgery

## 2021-06-14 ENCOUNTER — Other Ambulatory Visit: Payer: Self-pay

## 2021-06-14 DIAGNOSIS — G894 Chronic pain syndrome: Secondary | ICD-10-CM

## 2021-06-14 DIAGNOSIS — M25512 Pain in left shoulder: Secondary | ICD-10-CM | POA: Diagnosis not present

## 2021-06-14 DIAGNOSIS — F17210 Nicotine dependence, cigarettes, uncomplicated: Secondary | ICD-10-CM

## 2021-06-14 DIAGNOSIS — G8929 Other chronic pain: Secondary | ICD-10-CM | POA: Diagnosis not present

## 2021-06-14 NOTE — Progress Notes (Signed)
PROCEDURE NOTE: ? ?The patient request injection, verbal consent was obtained. ? ?The left shoulder was prepped appropriately after time out was performed.  ? ?Sterile technique was observed and injection of 1 cc of DepoMedrol '40mg'$  with several cc's of plain xylocaine. Anesthesia was provided by ethyl chloride and a 20-gauge needle was used to inject the shoulder area. A posterior approach was used.  The injection was tolerated well. ? ?A band aid dressing was applied. ? ?The patient was advised to apply ice later today and tomorrow to the injection sight as needed. ? ?Encounter Diagnoses  ?Name Primary?  ? Chronic left shoulder pain Yes  ? Pain syndrome, chronic   ? Cigarette nicotine dependence without complication   ? ?Return in one month. ? ?Call if any problem. ? ?Precautions discussed. ? ?Electronically Signed ?Sanjuana Kava, MD ?3/15/202310:58 AM ? ?

## 2021-06-21 ENCOUNTER — Telehealth: Payer: Self-pay | Admitting: Orthopaedic Surgery

## 2021-06-21 NOTE — Telephone Encounter (Signed)
Patient called for refill: ?oxyCODONE (ROXICODONE) 15 MG immediate release tablet 145 tablet  ?   [facility] ?

## 2021-06-22 MED ORDER — OXYCODONE HCL 15 MG PO TABS: ORAL_TABLET | ORAL | 0 refills | Status: DC

## 2021-06-26 ENCOUNTER — Other Ambulatory Visit: Payer: Self-pay | Admitting: Cardiology

## 2021-06-30 ENCOUNTER — Telehealth: Payer: Self-pay

## 2021-06-30 NOTE — Telephone Encounter (Signed)
Attempted to contact patient to schedule a Palliative Care consult appointment. No answer left a message to return call.  

## 2021-07-05 ENCOUNTER — Telehealth: Payer: Self-pay

## 2021-07-05 NOTE — Telephone Encounter (Signed)
Attempted to contact patient to schedule a Palliative Care consult appointment. No answer left a message to return call.  

## 2021-07-12 ENCOUNTER — Ambulatory Visit (INDEPENDENT_AMBULATORY_CARE_PROVIDER_SITE_OTHER): Payer: Medicare Other | Admitting: Orthopaedic Surgery

## 2021-07-12 ENCOUNTER — Encounter: Payer: Self-pay | Admitting: Orthopaedic Surgery

## 2021-07-12 VITALS — Ht 60.0 in | Wt 143.0 lb

## 2021-07-12 DIAGNOSIS — G8929 Other chronic pain: Secondary | ICD-10-CM

## 2021-07-12 DIAGNOSIS — M25512 Pain in left shoulder: Secondary | ICD-10-CM

## 2021-07-12 DIAGNOSIS — F17210 Nicotine dependence, cigarettes, uncomplicated: Secondary | ICD-10-CM

## 2021-07-12 DIAGNOSIS — G894 Chronic pain syndrome: Secondary | ICD-10-CM

## 2021-07-12 NOTE — Progress Notes (Signed)
PROCEDURE NOTE: ? ?The patient request injection, verbal consent was obtained. ? ?The left shoulder was prepped appropriately after time out was performed.  ? ?Sterile technique was observed and injection of 1 cc of DepoMedrol '40mg'$  with several cc's of plain xylocaine. Anesthesia was provided by ethyl chloride and a 20-gauge needle was used to inject the shoulder area. A posterior approach was used.  The injection was tolerated well. ? ?A band aid dressing was applied. ? ?The patient was advised to apply ice later today and tomorrow to the injection sight as needed. ? ?Encounter Diagnoses  ?Name Primary?  ? Chronic left shoulder pain Yes  ? Pain syndrome, chronic   ? Cigarette nicotine dependence without complication   ? ?Return in one month. ? ?Call if any problem. ? ?Precautions discussed. ? ?Electronically Signed ?Sanjuana Kava, MD ?4/12/202310:38 AM ? ?

## 2021-07-12 NOTE — Patient Instructions (Signed)

## 2021-07-13 ENCOUNTER — Telehealth: Payer: Self-pay

## 2021-07-13 NOTE — Telephone Encounter (Signed)
Attempted to contact patient and patient's sister Tye [place] to schedule a Palliative Care consult appointment. No answer left a message to return call.  ?

## 2021-07-20 ENCOUNTER — Telehealth: Payer: Self-pay | Admitting: Orthopaedic Surgery

## 2021-07-20 NOTE — Telephone Encounter (Signed)
Patient called for refill for her pain medicine  ? ?oxyCODONE (ROXICODONE) 15 MG immediate release tablet ? ? ?Pharmacy:  [facility]  ?

## 2021-07-20 NOTE — Telephone Encounter (Signed)
I advised her it will be next week before her medicine will be filled.  Patient said she was not aware of the cut off for medicine 12:00 pm on Thursday, as long as she has been seeing Dr. Luna Glasgow she was NEVER told that and she will talk with him about this, her medicine runs out this Saturday and she needs her medicine.  ?

## 2021-07-21 MED ORDER — OXYCODONE HCL 15 MG PO TABS: ORAL_TABLET | ORAL | 0 refills | Status: DC

## 2021-08-09 ENCOUNTER — Encounter: Payer: Self-pay | Admitting: Orthopaedic Surgery

## 2021-08-09 ENCOUNTER — Ambulatory Visit (INDEPENDENT_AMBULATORY_CARE_PROVIDER_SITE_OTHER): Payer: Medicare Other | Admitting: Orthopaedic Surgery

## 2021-08-09 DIAGNOSIS — G894 Chronic pain syndrome: Secondary | ICD-10-CM

## 2021-08-09 DIAGNOSIS — F17210 Nicotine dependence, cigarettes, uncomplicated: Secondary | ICD-10-CM | POA: Diagnosis not present

## 2021-08-09 DIAGNOSIS — G8929 Other chronic pain: Secondary | ICD-10-CM | POA: Diagnosis not present

## 2021-08-09 DIAGNOSIS — M25512 Pain in left shoulder: Secondary | ICD-10-CM | POA: Diagnosis not present

## 2021-08-09 MED ORDER — OXYCODONE HCL 15 MG PO TABS: ORAL_TABLET | ORAL | 0 refills | Status: DC

## 2021-08-09 NOTE — Progress Notes (Signed)
S 

## 2021-08-09 NOTE — Addendum Note (Signed)
Addended by: Willette Pa on: 08/09/2021 10:57 AM ? ? Modules accepted: Orders ? ?

## 2021-08-09 NOTE — Progress Notes (Addendum)
PROCEDURE NOTE: ? ?The patient request injection, verbal consent was obtained. ? ?The left shoulder was prepped appropriately after time out was performed.  ? ?Sterile technique was observed and injection of 1 cc of DepoMedrol '40mg'$  with several cc's of plain xylocaine. Anesthesia was provided by ethyl chloride and a 20-gauge needle was used to inject the shoulder area. A posterior approach was used.  The injection was tolerated well. ? ?A band aid dressing was applied. ? ?The patient was advised to apply ice later today and tomorrow to the injection sight as needed. ?  ?Encounter Diagnoses  ?Name Primary?  ? Chronic left shoulder pain Yes  ? Pain syndrome, chronic   ? Cigarette nicotine dependence without complication   ? ?Return in one month. ? ?Call if any problem. ? ?Precautions discussed. ? ?Electronically Signed ?Sanjuana Kava, MD ?5/10/202310:55 AM ? ?I have reviewed the [facility] web site prior to prescribing narcotic medicine for this patient. ? ?Electronically Signed ?Sanjuana Kava, MD ?5/10/202310:57 AM ? ? ?

## 2021-08-22 ENCOUNTER — Other Ambulatory Visit: Payer: Self-pay | Admitting: Cardiology

## 2021-09-06 ENCOUNTER — Ambulatory Visit (INDEPENDENT_AMBULATORY_CARE_PROVIDER_SITE_OTHER): Payer: Medicare Other | Admitting: Orthopaedic Surgery

## 2021-09-06 ENCOUNTER — Encounter: Payer: Self-pay | Admitting: Orthopaedic Surgery

## 2021-09-06 DIAGNOSIS — G894 Chronic pain syndrome: Secondary | ICD-10-CM

## 2021-09-06 DIAGNOSIS — F17210 Nicotine dependence, cigarettes, uncomplicated: Secondary | ICD-10-CM | POA: Diagnosis not present

## 2021-09-06 DIAGNOSIS — G8929 Other chronic pain: Secondary | ICD-10-CM | POA: Diagnosis not present

## 2021-09-06 DIAGNOSIS — M25512 Pain in left shoulder: Secondary | ICD-10-CM

## 2021-09-06 NOTE — Progress Notes (Signed)
PROCEDURE NOTE:  The patient request injection, verbal consent was obtained.  The left shoulder was prepped appropriately after time out was performed.   Sterile technique was observed and injection of 1 cc of DepoMedrol '40mg'$  with several cc's of plain xylocaine. Anesthesia was provided by ethyl chloride and a 20-gauge needle was used to inject the shoulder area. A posterior approach was used.  The injection was tolerated well.  A band aid dressing was applied.  The patient was advised to apply ice later today and tomorrow to the injection sight as needed.  Encounter Diagnoses  Name Primary?   Chronic left shoulder pain Yes   Pain syndrome, chronic    Cigarette nicotine dependence without complication    See in [place] office in five or six weeks.  Call if any problem.  Precautions discussed.  Electronically Signed Sanjuana Kava, MD 6/7/202310:45 AM

## 2021-09-14 ENCOUNTER — Other Ambulatory Visit: Payer: Self-pay | Admitting: Cardiology

## 2021-09-19 ENCOUNTER — Telehealth: Payer: Self-pay

## 2021-09-19 ENCOUNTER — Telehealth: Payer: Self-pay | Admitting: Orthopaedic Surgery

## 2021-09-19 MED ORDER — OXYCODONE HCL 15 MG PO TABS: ORAL_TABLET | ORAL | 0 refills | Status: DC

## 2021-09-19 NOTE — Telephone Encounter (Signed)
Patient called and asked for [name], advised her [name] is in clinic with Dr. Luna Glasgow right now.   She said she was told to call today and ask for a refill for her pain medicine because Dr. Luna Glasgow is going to be gone for 2 weeks.   I advised her if it is to early it won't be filled   She wants a refill on her   oxyCODONE (ROXICODONE) 15 MG immediate release tablet  Pharmacy: [facility]

## 2021-09-19 NOTE — Telephone Encounter (Signed)
Oxycodone 15 MG  Qty 145 Tablets  PATIENT USES [facility]  Patient states her medication is due on the 22nd and to let you know that she spoke with the pharmacy and they said   to let you know to cancel the script you had already sent in and redo it for 09/21/21

## 2021-09-20 MED ORDER — OXYCODONE HCL 15 MG PO TABS: ORAL_TABLET | ORAL | 0 refills | Status: DC

## 2021-10-18 ENCOUNTER — Other Ambulatory Visit: Payer: Self-pay

## 2021-10-18 ENCOUNTER — Telehealth: Payer: Self-pay | Admitting: Orthopaedic Surgery

## 2021-10-18 MED ORDER — OXYCODONE HCL 15 MG PO TABS: ORAL_TABLET | ORAL | 0 refills | Status: DC

## 2021-10-18 NOTE — Telephone Encounter (Signed)
Patient requests refill oxyCODONE (ROXICODONE) 15 MG immediate release tablet 145 tablet      [facility]

## 2021-10-18 NOTE — Telephone Encounter (Signed)
Sent to provider 

## 2021-10-25 ENCOUNTER — Encounter: Payer: Self-pay | Admitting: Orthopaedic Surgery

## 2021-10-25 ENCOUNTER — Ambulatory Visit (INDEPENDENT_AMBULATORY_CARE_PROVIDER_SITE_OTHER): Payer: Medicare Other | Admitting: Orthopaedic Surgery

## 2021-10-25 DIAGNOSIS — G8929 Other chronic pain: Secondary | ICD-10-CM

## 2021-10-25 DIAGNOSIS — F17210 Nicotine dependence, cigarettes, uncomplicated: Secondary | ICD-10-CM

## 2021-10-25 DIAGNOSIS — M25512 Pain in left shoulder: Secondary | ICD-10-CM | POA: Diagnosis not present

## 2021-10-25 DIAGNOSIS — G894 Chronic pain syndrome: Secondary | ICD-10-CM | POA: Diagnosis not present

## 2021-10-25 NOTE — Progress Notes (Signed)
PROCEDURE NOTE:  The patient request injection, verbal consent was obtained.  The left shoulder was prepped appropriately after time out was performed.   Sterile technique was observed and injection of 1 cc of DepoMedrol '40mg'$  with several cc's of plain xylocaine. Anesthesia was provided by ethyl chloride and a 20-gauge needle was used to inject the shoulder area. A posterior approach was used.  The injection was tolerated well.  A band aid dressing was applied.  The patient was advised to apply ice later today and tomorrow to the injection sight as needed.  Encounter Diagnoses  Name Primary?   Chronic left shoulder pain Yes   Pain syndrome, chronic    Cigarette nicotine dependence without complication    Return in three months.  Call if any problem.  Precautions discussed.  Electronically Signed Sanjuana Kava, MD 7/26/202310:42 AM

## 2021-11-12 ENCOUNTER — Other Ambulatory Visit: Payer: Self-pay | Admitting: Cardiology

## 2021-11-15 ENCOUNTER — Telehealth: Payer: Self-pay

## 2021-11-15 NOTE — Telephone Encounter (Signed)
Oxycodone 15 MG immediate release  Qty 145 Tablets  PATIENT USES [facility]

## 2021-11-16 MED ORDER — OXYCODONE HCL 15 MG PO TABS: ORAL_TABLET | ORAL | 0 refills | Status: DC

## 2021-12-14 ENCOUNTER — Telehealth: Payer: Self-pay | Admitting: Orthopaedic Surgery

## 2021-12-14 MED ORDER — OXYCODONE HCL 15 MG PO TABS: ORAL_TABLET | ORAL | 0 refills | Status: DC

## 2021-12-14 NOTE — Telephone Encounter (Signed)
Patient requests refill:  oxyCODONE (ROXICODONE) 15 MG immediate release tablet 145 tablet       [facility]

## 2021-12-18 ENCOUNTER — Other Ambulatory Visit: Payer: Self-pay | Admitting: Cardiology

## 2022-01-10 ENCOUNTER — Other Ambulatory Visit: Payer: Self-pay | Admitting: Radiology

## 2022-01-10 NOTE — Telephone Encounter (Signed)
Patient called, asked for refill of hydrocodone to [facility].  Due 01/13/22.

## 2022-01-11 MED ORDER — OXYCODONE HCL 15 MG PO TABS: ORAL_TABLET | ORAL | 0 refills | Status: DC

## 2022-01-25 ENCOUNTER — Ambulatory Visit (HOSPITAL_COMMUNITY): Payer: Medicare Other | Admitting: Physical Therapy

## 2022-01-25 ENCOUNTER — Ambulatory Visit: Payer: Medicare Other | Admitting: Orthopaedic Surgery

## 2022-02-06 ENCOUNTER — Ambulatory Visit: Payer: Medicare Other | Admitting: Orthopaedic Surgery

## 2022-02-06 ENCOUNTER — Telehealth: Payer: Self-pay | Admitting: Orthopaedic Surgery

## 2022-02-06 MED ORDER — OXYCODONE HCL 15 MG PO TABS: ORAL_TABLET | ORAL | 0 refills | Status: DC

## 2022-02-06 NOTE — Telephone Encounter (Signed)
Patient called, running a fever and having stomach issues.  She cancelled her appointment for today and rescheduled.  She is requesting a refill for Hydrocodone '15mg'$ , she stated the quantity on the bottle is 145, [facility].

## 2022-02-09 ENCOUNTER — Ambulatory Visit (HOSPITAL_COMMUNITY): Payer: Medicare Other | Admitting: Physical Therapy

## 2022-02-13 ENCOUNTER — Ambulatory Visit (INDEPENDENT_AMBULATORY_CARE_PROVIDER_SITE_OTHER): Payer: Medicare Other | Admitting: Orthopaedic Surgery

## 2022-02-13 ENCOUNTER — Encounter: Payer: Self-pay | Admitting: Orthopaedic Surgery

## 2022-02-13 ENCOUNTER — Ambulatory Visit: Payer: Medicare Other | Admitting: Orthopaedic Surgery

## 2022-02-13 DIAGNOSIS — G8929 Other chronic pain: Secondary | ICD-10-CM

## 2022-02-13 DIAGNOSIS — R29898 Other symptoms and signs involving the musculoskeletal system: Secondary | ICD-10-CM

## 2022-02-13 DIAGNOSIS — M25512 Pain in left shoulder: Secondary | ICD-10-CM

## 2022-02-13 DIAGNOSIS — G894 Chronic pain syndrome: Secondary | ICD-10-CM

## 2022-02-13 MED ORDER — METHYLPREDNISOLONE ACETATE 40 MG/ML IJ SUSP
40.0000 mg | Freq: Once | INTRAMUSCULAR | Status: AC
  Administered 2022-02-13: 40 mg via INTRA_ARTICULAR

## 2022-02-13 NOTE — Patient Instructions (Signed)
Steps to Quit Smoking Smoking tobacco is the leading cause of preventable death. It can affect almost every organ in the body. Smoking puts you and people around you at risk for many serious, long-lasting (chronic) diseases. Quitting smoking can be hard, but it is one of the best things that you can do for your health. It is never too late to quit. Do not give up if you cannot quit the first time. Some people need to try many times to quit. Do your best to stick to your quit plan, and talk with your doctor if you have any questions or concerns. How do I get ready to quit? Pick a date to quit. Set a date within the next 2 weeks to give you time to prepare. Write down the reasons why you are quitting. Keep this list in places where you will see it often. Tell your family, friends, and co-workers that you are quitting. Their support is important. Talk with your doctor about the choices that may help you quit. Find out if your health insurance will pay for these treatments. Know the people, places, things, and activities that make you want to smoke (triggers). Avoid them. What first steps can I take to quit smoking? Throw away all cigarettes at home, at work, and in your car. Throw away the things that you use when you smoke, such as ashtrays and lighters. Clean your car. Empty the ashtray. Clean your home, including curtains and carpets. What can I do to help me quit smoking? Talk with your doctor about taking medicines and seeing a counselor. You are more likely to succeed when you do both. If you are pregnant or breastfeeding: Talk with your doctor about counseling or other ways to quit smoking. Do not take medicine to help you quit smoking unless your doctor tells you to. Quit right away Quit smoking completely, instead of slowly cutting back on how much you smoke over a period of time. Stopping smoking right away may be more successful than slowly quitting. Go to counseling. In-person is best  if this is an option. You are more likely to quit if you go to counseling sessions regularly. Take medicine You may take medicines to help you quit. Some medicines need a prescription, and some you can buy over-the-counter. Some medicines may contain a drug called nicotine to replace the nicotine in cigarettes. Medicines may: Help you stop having the desire to smoke (cravings). Help to stop the problems that come when you stop smoking (withdrawal symptoms). Your doctor may ask you to use: Nicotine patches, gum, or lozenges. Nicotine inhalers or sprays. Non-nicotine medicine that you take by mouth. Find resources Find resources and other ways to help you quit smoking and remain smoke-free after you quit. They include: Online chats with a counselor. Phone quitlines. Printed self-help materials. Support groups or group counseling. Text messaging programs. Mobile phone apps. Use apps on your mobile phone or tablet that can help you stick to your quit plan. Examples of free services include Quit Guide from the CDC and smokefree.gov  What can I do to make it easier to quit?  Talk to your family and friends. Ask them to support and encourage you. Call a phone quitline, such as 1-800-QUIT-NOW, reach out to support groups, or work with a counselor. Ask people who smoke to not smoke around you. Avoid places that make you want to smoke, such as: Bars. Parties. Smoke-break areas at work. Spend time with people who do not smoke. Lower   the stress in your life. Stress can make you want to smoke. Try these things to lower stress: Getting regular exercise. Doing deep-breathing exercises. Doing yoga. Meditating. What benefits will I see if I quit smoking? Over time, you may have: A better sense of smell and taste. Less coughing and sore throat. A slower heart rate. Lower blood pressure. Clearer skin. Better breathing. Fewer sick days. Summary Quitting smoking can be hard, but it is one of  the best things that you can do for your health. Do not give up if you cannot quit the first time. Some people need to try many times to quit. When you decide to quit smoking, make a plan to help you succeed. Quit smoking right away, not slowly over a period of time. When you start quitting, get help and support to keep you smoke-free. This information is not intended to replace advice given to you by your health care provider. Make sure you discuss any questions you have with your health care provider. Document Revised: 03/10/2021 Document Reviewed: 03/10/2021 Elsevier Patient Education  2023 Elsevier Inc.  

## 2022-02-13 NOTE — Progress Notes (Addendum)
PROCEDURE NOTE:  The patient request injection, verbal consent was obtained.  The left shoulder was prepped appropriately after time out was performed.   Sterile technique was observed and injection of 1 cc of DepoMedrol '40mg'$  with several cc's of plain xylocaine. Anesthesia was provided by ethyl chloride and a 20-gauge needle was used to inject the shoulder area. A posterior approach was used.  The injection was tolerated well.  A band aid dressing was applied.  The patient was advised to apply ice later today and tomorrow to the injection sight as needed.  Encounter Diagnoses  Name Primary?   Chronic left shoulder pain Yes   Pain syndrome, chronic    Leg weakness, bilateral    Return in three months.  I will have staff arrange for nurse to check on her.  She has no family, no one to help.  Call if any problem.  Precautions discussed.  Electronically Signed Sanjuana Kava, MD 11/14/20233:32 PM  Home health eval and treat needed secondary to osteoarthritis resulting in need for Physical Therapy related to difficulties with Upper and Lower extremity functional use.   Electronically Signed Sanjuana Kava, MD 11/22/202311:09 AM

## 2022-03-02 ENCOUNTER — Telehealth: Payer: Self-pay | Admitting: Orthopaedic Surgery

## 2022-03-02 NOTE — Telephone Encounter (Signed)
[name] w/[facility] lvm -  wants verbal orders for this pt for PT once a week for 4 weeks and a verbal order for a nursing evaluation for wounds on her legs [phone], if you get vm, you can lvm it's confidential.

## 2022-03-02 NOTE — Telephone Encounter (Signed)
Spoke with [name] and gave verbal order for PT 1 x week for 4 weeks and verbal order for nursing in order to evaluate leg wounds.

## 2022-03-07 ENCOUNTER — Telehealth: Payer: Self-pay

## 2022-03-07 NOTE — Telephone Encounter (Signed)
Oxycodone 15 MG immediate release tablet   Qty 145  Tablets  PATIENT USES [name]

## 2022-03-07 NOTE — Telephone Encounter (Signed)
Send to provider

## 2022-03-08 MED ORDER — OXYCODONE HCL 15 MG PO TABS: ORAL_TABLET | ORAL | 0 refills | Status: DC

## 2022-04-03 ENCOUNTER — Telehealth: Payer: Self-pay

## 2022-04-03 NOTE — Telephone Encounter (Signed)
Oxycodone 15 MG Immediate Release    Qty 145 Tablets  PATIENT USES [facility]

## 2022-04-04 MED ORDER — OXYCODONE HCL 15 MG PO TABS: ORAL_TABLET | ORAL | 0 refills | Status: DC

## 2022-04-17 ENCOUNTER — Ambulatory Visit: Payer: Medicare Other | Admitting: Orthopaedic Surgery

## 2022-04-25 ENCOUNTER — Telehealth: Payer: Self-pay | Admitting: Orthopaedic Surgery

## 2022-05-08 ENCOUNTER — Encounter: Payer: Self-pay | Admitting: Orthopaedic Surgery

## 2022-05-08 ENCOUNTER — Ambulatory Visit (INDEPENDENT_AMBULATORY_CARE_PROVIDER_SITE_OTHER): Payer: Medicare Other | Admitting: Orthopaedic Surgery

## 2022-05-08 DIAGNOSIS — M25512 Pain in left shoulder: Secondary | ICD-10-CM | POA: Diagnosis not present

## 2022-05-08 DIAGNOSIS — F17210 Nicotine dependence, cigarettes, uncomplicated: Secondary | ICD-10-CM

## 2022-05-08 DIAGNOSIS — G8929 Other chronic pain: Secondary | ICD-10-CM

## 2022-05-08 NOTE — Patient Instructions (Signed)
Steps to Quit Smoking Smoking tobacco is the leading cause of preventable death. It can affect almost every organ in the body. Smoking puts you and people around you at risk for many serious, long-lasting (chronic) diseases. Quitting smoking can be hard, but it is one of the best things that you can do for your health. It is never too late to quit. Do not give up if you cannot quit the first time. Some people need to try many times to quit. Do your best to stick to your quit plan, and talk with your doctor if you have any questions or concerns. How do I get ready to quit? Pick a date to quit. Set a date within the next 2 weeks to give you time to prepare. Write down the reasons why you are quitting. Keep this list in places where you will see it often. Tell your family, friends, and co-workers that you are quitting. Their support is important. Talk with your doctor about the choices that may help you quit. Find out if your health insurance will pay for these treatments. Know the people, places, things, and activities that make you want to smoke (triggers). Avoid them. What first steps can I take to quit smoking? Throw away all cigarettes at home, at work, and in your car. Throw away the things that you use when you smoke, such as ashtrays and lighters. Clean your car. Empty the ashtray. Clean your home, including curtains and carpets. What can I do to help me quit smoking? Talk with your doctor about taking medicines and seeing a counselor. You are more likely to succeed when you do both. If you are pregnant or breastfeeding: Talk with your doctor about counseling or other ways to quit smoking. Do not take medicine to help you quit smoking unless your doctor tells you to. Quit right away Quit smoking completely, instead of slowly cutting back on how much you smoke over a period of time. Stopping smoking right away may be more successful than slowly quitting. Go to counseling. In-person is best  if this is an option. You are more likely to quit if you go to counseling sessions regularly. Take medicine You may take medicines to help you quit. Some medicines need a prescription, and some you can buy over-the-counter. Some medicines may contain a drug called nicotine to replace the nicotine in cigarettes. Medicines may: Help you stop having the desire to smoke (cravings). Help to stop the problems that come when you stop smoking (withdrawal symptoms). Your doctor may ask you to use: Nicotine patches, gum, or lozenges. Nicotine inhalers or sprays. Non-nicotine medicine that you take by mouth. Find resources Find resources and other ways to help you quit smoking and remain smoke-free after you quit. They include: Online chats with a counselor. Phone quitlines. Printed self-help materials. Support groups or group counseling. Text messaging programs. Mobile phone apps. Use apps on your mobile phone or tablet that can help you stick to your quit plan. Examples of free services include Quit Guide from the CDC and smokefree.gov  What can I do to make it easier to quit?  Talk to your family and friends. Ask them to support and encourage you. Call a phone quitline, such as 1-800-QUIT-NOW, reach out to support groups, or work with a counselor. Ask people who smoke to not smoke around you. Avoid places that make you want to smoke, such as: Bars. Parties. Smoke-break areas at work. Spend time with people who do not smoke. Lower   the stress in your life. Stress can make you want to smoke. Try these things to lower stress: Getting regular exercise. Doing deep-breathing exercises. Doing yoga. Meditating. What benefits will I see if I quit smoking? Over time, you may have: A better sense of smell and taste. Less coughing and sore throat. A slower heart rate. Lower blood pressure. Clearer skin. Better breathing. Fewer sick days. Summary Quitting smoking can be hard, but it is one of  the best things that you can do for your health. Do not give up if you cannot quit the first time. Some people need to try many times to quit. When you decide to quit smoking, make a plan to help you succeed. Quit smoking right away, not slowly over a period of time. When you start quitting, get help and support to keep you smoke-free. This information is not intended to replace advice given to you by your health care provider. Make sure you discuss any questions you have with your health care provider. Document Revised: 03/10/2021 Document Reviewed: 03/10/2021 Elsevier Patient Education  2023 Elsevier Inc.  

## 2022-05-08 NOTE — Progress Notes (Signed)
PROCEDURE NOTE:  The patient request injection, verbal consent was obtained.  The left shoulder was prepped appropriately after time out was performed.   Sterile technique was observed and injection of 1 cc of DepoMedrol '40mg'$  with several cc's of plain xylocaine. Anesthesia was provided by ethyl chloride and a 20-gauge needle was used to inject the shoulder area. A posterior approach was used.  The injection was tolerated well.  A band aid dressing was applied.  The patient was advised to apply ice later today and tomorrow to the injection sight as needed.  Encounter Diagnoses  Name Primary?   Chronic left shoulder pain Yes   Cigarette nicotine dependence without complication    Return in three months.  Call if any problem.  Precautions discussed.  Electronically Signed Sanjuana Kava, MD 2/6/20242:19 PM

## 2022-05-28 ENCOUNTER — Telehealth: Payer: Self-pay | Admitting: Orthopaedic Surgery

## 2022-05-30 ENCOUNTER — Other Ambulatory Visit: Payer: Self-pay | Admitting: Cardiology

## 2022-06-06 ENCOUNTER — Other Ambulatory Visit: Payer: Self-pay | Admitting: Cardiology

## 2022-06-10 ENCOUNTER — Other Ambulatory Visit: Payer: Self-pay | Admitting: Cardiology

## 2022-06-26 ENCOUNTER — Telehealth: Payer: Self-pay | Admitting: Orthopaedic Surgery

## 2022-06-27 ENCOUNTER — Other Ambulatory Visit: Payer: Self-pay | Admitting: Cardiology

## 2022-07-05 ENCOUNTER — Telehealth: Payer: Self-pay | Admitting: Cardiology

## 2022-07-05 MED ORDER — METOPROLOL TARTRATE 25 MG PO TABS: ORAL_TABLET | ORAL | 0 refills | Status: DC

## 2022-07-05 NOTE — Telephone Encounter (Signed)
Pt called back, She is schedule for the end of May. Pt stated that she had fallen and hurt her back and hip. Pt stated that she is currently unable to get up and go anywhere. Pt would like to have a refill that will last until her appt in May. Please advise

## 2022-07-05 NOTE — Telephone Encounter (Signed)
Attempted to contact patient to schedule appointment for medication refills.

## 2022-07-05 NOTE — Telephone Encounter (Signed)
*  STAT* If patient is at the pharmacy, call can be transferred to refill team.   1. Which medications need to be refilled? (please list name of each medication and dose if known)   metoprolol tartrate (LOPRESSOR) 25 MG tablet   2. Which pharmacy/location (including street and city if local pharmacy) is medication to be sent to?  [place], [place] - [place]   3. Do they need a 30 day or 90 day supply?   30 day  Patient stated she is completely out of this medication.

## 2022-07-05 NOTE — Telephone Encounter (Signed)
Pt made apt I'm May, 8 week supply lopressor given.  Patient last seen 9/22 and was to return in 4 months-she has not been seen since.

## 2022-07-24 ENCOUNTER — Telehealth: Payer: Self-pay

## 2022-07-24 MED ORDER — OXYCODONE HCL 15 MG PO TABS: ORAL_TABLET | ORAL | 0 refills | Status: DC

## 2022-07-24 NOTE — Telephone Encounter (Signed)
Oxycodone-Acetaminophen 15 MG  Qty 145 Tablets  PATIENT USES [name]

## 2022-08-07 ENCOUNTER — Ambulatory Visit (INDEPENDENT_AMBULATORY_CARE_PROVIDER_SITE_OTHER): Payer: Medicare Other | Admitting: Orthopaedic Surgery

## 2022-08-07 ENCOUNTER — Encounter: Payer: Self-pay | Admitting: Orthopaedic Surgery

## 2022-08-07 VITALS — BP 118/62 | HR 73 | Ht <= 58 in | Wt 145.0 lb

## 2022-08-07 DIAGNOSIS — G894 Chronic pain syndrome: Secondary | ICD-10-CM

## 2022-08-07 DIAGNOSIS — M25512 Pain in left shoulder: Secondary | ICD-10-CM

## 2022-08-07 DIAGNOSIS — G8929 Other chronic pain: Secondary | ICD-10-CM

## 2022-08-07 DIAGNOSIS — F17210 Nicotine dependence, cigarettes, uncomplicated: Secondary | ICD-10-CM

## 2022-08-07 NOTE — Progress Notes (Signed)
PROCEDURE NOTE:  The patient request injection, verbal consent was obtained.  The left shoulder was prepped appropriately after time out was performed.   Sterile technique was observed and injection of 1 cc of DepoMedrol 40mg  with several cc's of plain xylocaine. Anesthesia was provided by ethyl chloride and a 20-gauge needle was used to inject the shoulder area. A posterior approach was used.  The injection was tolerated well.  A band aid dressing was applied.  The patient was advised to apply ice later today and tomorrow to the injection sight as needed.  Encounter Diagnoses  Name Primary?   Chronic left shoulder pain Yes   Cigarette nicotine dependence without complication    Pain syndrome, chronic    Her A1C is 5.4.  I have read note from her other doctor.   She is slightly anemic and they will follow that.  Return in three months.  Call if any problem.  Precautions discussed.  Electronically Signed Darreld Mclean, MD 5/7/20241:36 PM

## 2022-08-13 ENCOUNTER — Telehealth: Payer: Self-pay | Admitting: Internal Medicine

## 2022-08-13 MED ORDER — DOXYCYCLINE HYCLATE 100 MG PO TABS: 100.0000 mg | ORAL_TABLET | Freq: Two times a day (BID) | ORAL | 0 refills | Status: DC

## 2022-08-13 NOTE — Addendum Note (Signed)
Addended by: Maurene Capes on: 08/13/2022 03:59 PM   Modules accepted: Orders

## 2022-08-13 NOTE — Telephone Encounter (Signed)
PT is in [place] and has transportation issues. Wants to be see for SOB as an Acute visit. No openings. Please call to advise if we can work her in locally.  [phone]

## 2022-08-13 NOTE — Telephone Encounter (Signed)
Called and spoke w/ pt she verbalized that her chest feel heavy, upper back is sore, nasal congestion, productive coughing (dark green sputum) ongoing 5-29mo. States that she always runs a low grade fever.  Currently using 2L O2. Offered pt appt in [place] Wednesday, but pt refused bc of transportation issues.   Dr.Wert, please advise?

## 2022-08-13 NOTE — Telephone Encounter (Signed)
Called and spoke with patient. She verbalized understanding. Doxy has been sent to pharmacy. I was also able to get her scheduled to see Dr. Sherene Sires on 6/20 at 4pm for a follow up.   Nothing further needed at time of call.

## 2022-08-13 NOTE — Telephone Encounter (Signed)
She has muliple drug allergies so all I can offer her is doxycline  100 mg twice daily x 10 days and f/u ov in 2 weeks with all meds in hand to regroup

## 2022-08-21 ENCOUNTER — Telehealth: Payer: Self-pay | Admitting: Orthopaedic Surgery

## 2022-08-21 MED ORDER — OXYCODONE HCL 15 MG PO TABS: ORAL_TABLET | ORAL | 0 refills | Status: DC

## 2022-08-21 NOTE — Telephone Encounter (Signed)
[facility] - spoke w/the patient, she is requesting a refill on Oxycodone 15mg  to be sent to [facility].

## 2022-08-30 ENCOUNTER — Ambulatory Visit: Payer: Medicare Other | Admitting: Nurse Practitioner

## 2022-09-06 ENCOUNTER — Ambulatory Visit: Payer: Medicare Other | Attending: Nurse Practitioner | Admitting: Nurse Practitioner

## 2022-09-06 ENCOUNTER — Encounter: Payer: Self-pay | Admitting: Nurse Practitioner

## 2022-09-06 VITALS — BP 142/98 | HR 76 | Ht 60.0 in | Wt 145.4 lb

## 2022-09-06 DIAGNOSIS — R079 Chest pain, unspecified: Secondary | ICD-10-CM | POA: Diagnosis present

## 2022-09-06 DIAGNOSIS — I10 Essential (primary) hypertension: Secondary | ICD-10-CM | POA: Diagnosis present

## 2022-09-06 DIAGNOSIS — R600 Localized edema: Secondary | ICD-10-CM | POA: Diagnosis present

## 2022-09-06 NOTE — Progress Notes (Signed)
Office Visit    Patient Name: [name] Date of Encounter: 09/06/2022  PCP:  Shelby Dubin, FNP   [facility]  Cardiologist:  Dina Rich, MD  Advanced Practice Provider:  No care team member to display Electrophysiologist:  None   Chief Complaint    [name] is a 62 y.o. female with a hx of chest pain, HTN, hypothyroidism, fibromyalgia, migraines, T2DM, OSA, obesity, and leg edema, who presents today for overdue 1 year follow-up.   Past Medical History    Past Medical History:  Diagnosis Date   Back pain    Depression    Fibromyalgia    GERD (gastroesophageal reflux disease)    Hypertension    diet control   Hypothyroid    IBS (irritable bowel syndrome)    Migraines    Obesity    Osteoarthritis    Sleep apnea    Past Surgical History:  Procedure Laterality Date   AMPUTATION  12/13/2011   Procedure: AMPUTATION DIGIT;  Surgeon: Dallas Schimke, DPM;  Location: [facility];  Service: Orthopedics;  Laterality: Right;  amputation second toe right foot   BACK SURGERY     BIOPSY  12/23/2015   Procedure: BIOPSY;  Surgeon: Malissa Hippo, MD;  Location: [facility];  Service: Endoscopy;;  pre pyloric patches   BREAST BIOPSY     CHOLECYSTECTOMY     CYSTOSCOPY W/ URETERAL STENT PLACEMENT Left 10/09/2017   Procedure: CYSTOSCOPY WITH RETROGRADE PYELOGRAM/URETERAL STENT PLACEMENT;  Surgeon: Malen Gauze, MD;  Location: [facility];  Service: Urology;  Laterality: Left;   CYSTOSCOPY WITH RETROGRADE PYELOGRAM, URETEROSCOPY AND STENT PLACEMENT Left 10/23/2017   Procedure: CYSTOSCOPY WITH LEFT RETROGRADE PYELOGRAM, LEFT URETEROSCOPY AND STENT EXCHANGE;  Surgeon: Malen Gauze, MD;  Location: [facility];  Service: Urology;  Laterality: Left;   ESOPHAGOGASTRODUODENOSCOPY  05/28/2011   Procedure: ESOPHAGOGASTRODUODENOSCOPY (EGD);  Surgeon: Malissa Hippo, MD;  Location: [facility];  Service: Endoscopy;  Laterality: N/A;  730    ESOPHAGOGASTRODUODENOSCOPY N/A 10/09/2013   Procedure: ESOPHAGOGASTRODUODENOSCOPY (EGD);  Surgeon: Malissa Hippo, MD;  Location: [facility];  Service: Endoscopy;  Laterality: N/A;  730   ESOPHAGOGASTRODUODENOSCOPY (EGD) WITH PROPOFOL N/A 12/23/2015   Procedure: ESOPHAGOGASTRODUODENOSCOPY (EGD) WITH PROPOFOL;  Surgeon: Malissa Hippo, MD;  [facility]: [facility];  Service: Endoscopy;  Laterality: N/A;   HERNIA REPAIR     HOLMIUM LASER APPLICATION Left 10/23/2017   Procedure: LEFT URETEROSCOPY WITH HOLMIUM LASER LITHOTRIPSY;  Surgeon: Malen Gauze, MD;  Location: [facility];  Service: Urology;  Laterality: Left;   MALONEY DILATION N/A 10/09/2013   Procedure: Elease Hashimoto DILATION;  Surgeon: Malissa Hippo, MD;  Location: [facility];  Service: Endoscopy;  Laterality: N/A;   NASAL SEPTUM SURGERY     NECK SURGERY  1998, 2008   2 yrs ago   TOTAL ABDOMINAL HYSTERECTOMY     Left oophroectomy for a lare tumor about 21 yrs. RT ovary removed time of hysterectomy    Allergies  Allergies  Allergen Reactions   Cephalexin Hives    unknown   Latex Rash   Betadine [Povidone Iodine] Rash   Cleocin [Clindamycin Hcl] Hives   Gentamicin    Other     Band aides - rash    Sulfa Antibiotics     Vomiting    Toradol [Ketorolac Tromethamine] Nausea And Vomiting    migraine    History of Present Illness    Brandi  KYLER Kelley is a 62 y.o. female with a PMH as mentioned above.   [name] was previously arranged in 2018 for chest pain evaluation, patient no showed. Echocardiogram in 2020 revealed normal EF.  Was to have repeat NST in 2020, had episode of SHOB/syncope with wearing facemask, was evaluated in ED, workup negative for ACS. Pt refused to reschedule NST. Unable to tolerate Imdur.   Last seen by Dr. Dina Rich on December 29, 2020. Was doing well at the time.   Today she presents for overdue 1 year follow-up. She admits to chronic pain and arthritis symptoms, says, "I am in physical pain  every day of my life." Has noticed intermittent chest pain described as shooting sensation, occurs at night, random in occurrence and denies any exertional symptoms, left arm numbness, says she had labs performed with PCP recently. Shortness of breath has been chronic and stable. Denies any palpitations, syncope, presyncope, dizziness, orthopnea, PND, swelling or significant weight changes, acute bleeding, or claudication.  EKGs/Labs/Other Studies Reviewed:   The following studies were reviewed today:   EKG:  EKG is ordered today.  The ekg ordered today demonstrates NSR, 83 bpm, nonspecific ST segment abnormality, no acute ischemic changes.   Echo 09/2018: 1. The left ventricle has normal systolic function with an ejection  fraction of 60-65%. The cavity size was normal. Left ventricular diastolic  parameters were normal. No evidence of left ventricular regional wall  motion abnormalities.   2. The right ventricle has normal systolic function. The cavity was  normal. There is mildly increased right ventricular wall thickness.   3. Left atrial size was mildly dilated.   4. The mitral valve is grossly normal. There is mild mitral annular  calcification present.   5. The tricuspid valve is grossly normal.   6. The aortic valve is grossly normal.   7. The aortic root is normal in size and structure.   8. The inferior vena cava was dilated in size with <50% respiratory  variability.  Recent Labs: No results found for requested labs within last 365 days.  Recent Lipid Panel    Component Value Date/Time   CHOL  10/16/2006 0139    145        ATP III CLASSIFICATION:  <200     mg/dL   Desirable  161-096  mg/dL   Borderline High  >=045    mg/dL   High   TRIG 409 (H) 81/19/1478 0139   HDL <10 (L) 10/16/2006 0139   CHOLHDL NOT CALCULATED 10/16/2006 0139   VLDL UNABLE TO CALCULATE IF TRIGLYCERIDE OVER 400 mg/dL 29/56/2130 8657   LDLCALC  10/16/2006 0139    UNABLE TO CALCULATE IF  TRIGLYCERIDE OVER 400 mg/dL        Total Cholesterol/HDL:CHD Risk Coronary Heart Disease Risk Table                     Men   Women  1/2 Average Risk   3.4   3.3    Home Medications   Current Meds  Medication Sig   diclofenac Sodium (VOLTAREN) 1 % GEL apply FOUR grams TO affected joints AS NEEDED FOUR TIMES DAILY   Docusate Sodium 100 MG capsule Take 100 mg by mouth 2 (two) times daily.   doxycycline (VIBRA-TABS) 100 MG tablet Take 1 tablet (100 mg total) by mouth 2 (two) times daily.   DULoxetine (CYMBALTA) 20 MG capsule Take 20 mg by mouth 2 (two) times daily.   esomeprazole (NEXIUM)  40 MG capsule Take 1 capsule (40 mg total) by mouth daily before breakfast.   hydrOXYzine (VISTARIL) 25 MG capsule Take 25 mg by mouth every 6 (six) hours as needed for anxiety.    levothyroxine (SYNTHROID) 125 MCG tablet Take 125 mcg by mouth daily.   metoprolol tartrate (LOPRESSOR) 25 MG tablet TAKE 1 TABLET BY MOUTH TWICE DAILY (needs APPOINTMENT FOR further refills)   ondansetron (ZOFRAN-ODT) 4 MG disintegrating tablet Take 4 mg by mouth daily as needed.   oxyCODONE (ROXICODONE) 15 MG immediate release tablet Take 1 tablet by mouth five times daily as needed for pain   polyethylene glycol (MIRALAX / GLYCOLAX) packet Take 17 g by mouth 2 (two) times daily before a meal.   potassium chloride (MICRO-K) 10 MEQ CR capsule Take 20 mEq by mouth daily.   pregabalin (LYRICA) 75 MG capsule Take 75 mg by mouth 3 (three) times daily.   Prucalopride Succinate (MOTEGRITY) 2 MG TABS Take 2 mg by mouth daily.   silver sulfADIAZINE (SILVADENE) 1 % cream APPLY TO THE AFFECTED AREA(S) TWICE DAILY UNTIL HEALED   spironolactone (ALDACTONE) 25 MG tablet TAKE 1/2 TABLET BY MOUTH DAILY - MUST SCHEDULE APPOINTMENT FOR MORE REFILLS   sucralfate (CARAFATE) 1 g tablet Take 1 g by mouth 4 (four) times daily.   SUMAtriptan (IMITREX) 100 MG tablet TAKE 1 TABLET BY MOUTH AT ONSET OF HEADACHE. MAY REPEAT ONCE IN TWO HOURS. NO MORE THAN  TWO TABLETS IN 24 HOURS   tiZANidine (ZANAFLEX) 4 MG tablet Take 4 mg by mouth 3 (three) times daily as needed.   topiramate (TOPAMAX) 100 MG tablet Take 50 mg by mouth 2 (two) times daily.   torsemide (DEMADEX) 20 MG tablet TAKE 4 TABLETS BY MOUTH EVERY DAY   traZODone (DESYREL) 100 MG tablet Take 100 mg by mouth at bedtime.   VENTOLIN HFA 108 (90 Base) MCG/ACT inhaler Inhale 1-2 puffs into the lungs every 6 (six) hours as needed for wheezing or shortness of breath.    Vitamin D, Ergocalciferol, (DRISDOL) 1.25 MG (50000 UNIT) CAPS capsule Take 50,000 Units by mouth once a week.     Review of Systems    All other systems reviewed and are otherwise negative except as noted above.  Physical Exam    VS:  BP (!) 142/98   Pulse 76   Ht 5' (1.524 m)   Wt 145 lb 6.4 oz (66 kg)   SpO2 98%   BMI 28.40 kg/m  , BMI Body mass index is 28.4 kg/m.  Wt Readings from Last 3 Encounters:  09/06/22 145 lb 6.4 oz (66 kg)  08/07/22 145 lb (65.8 kg)  07/12/21 143 lb (64.9 kg)    Repeat BP: 139/82  GEN: Well nourished, well developed, in no acute distress. HEENT: normal. Neck: Supple, no JVD, carotid bruits, or masses. Cardiac: S1/S2, RRR, no murmurs, rubs, or gallops. No clubbing, cyanosis. Generalized, nonpitting edema.  Radials/PT 2+ and equal bilaterally.  Respiratory:  Respirations regular and unlabored, clear to auscultation bilaterally. MS: Thoracic kyphosis and scoliosis noted, no deformity or atrophy. Skin: Pale, warm and dry, no rash. Neuro:  Strength and sensation are intact. Psych: Normal affect.  Assessment & Plan    Chest pain of uncertain etiology Etiology does not sound cardiac, but sounds MSK in nature, has chronic pain issues. Has been to see pain management clinic. Was previously arranged NST in past, currently refuses NST as she was dropped off table during previous test. EKG negative for acute  ischemic changes. No indication for ischemic evaluation at this time. No  medication changes at this time. ED precautions discussed. Continue to follow with PCP.  HTN BP elevated on arrival, attributes this to pain. States BP overall is well controlled. Discussed to monitor BP at home at least 2 hours after medications and sitting for 5-10 minutes. No medication changes at this time. Heart healthy diet encouraged. Will request recent labs from PCP's office.   Leg edema Stable. No recent issues. Continue current medication regimen.   Disposition: Follow up in 6 month(s) with Dina Rich, MD or APP.  Signed, Sharlene Dory, NP 09/09/2022, 8:29 PM [facility]

## 2022-09-06 NOTE — Patient Instructions (Signed)

## 2022-09-19 ENCOUNTER — Telehealth: Payer: Self-pay

## 2022-09-19 NOTE — Telephone Encounter (Signed)
Oxycodone-15 MG Immediate Release Tablet  Qty 145 Tablets  PATIENT USES [facility]

## 2022-09-20 ENCOUNTER — Ambulatory Visit: Payer: Medicare Other | Admitting: Internal Medicine

## 2022-09-20 MED ORDER — OXYCODONE HCL 15 MG PO TABS: ORAL_TABLET | ORAL | 0 refills | Status: DC

## 2022-10-01 NOTE — Progress Notes (Signed)
[name], female    DOB: April 17, 1960,    MRN: [id number]   Brief patient profile:  6 yowf active smoker with clinical dx of copd previously followed by Dr Juanetta Gosling referred to pulmonary clinic in [facility]  10/22/2019 by [name]   History of Present Illness / maint on ? elipta ?  10/22/2019  Pulmonary/ 1st office eval/Datron Brakebill  Dyspnea: limited by feet /legs hurt x  years and uses either cane or walker  Cough: worse x 6-8 m esp when try to lie down > slt yellow mucus also in am  so usually sits up but also back prevents lying down flat  Sleep: not well due mostly due to pain  SABA use: none now, has albuterol  02 prn up to 2lpm  rec Continue your inhaler and work on hard to reduce if not eliminate all smoking  zpak should change the mucus back to clear Call us with the name of your inhaler   Please remember to go to the  x-ray department  for your tests - we will call you with the results when they are available      02/02/2020  f/u ov/[place] office/Lane Eland re: still smoking/ gold 0 copd - still not clear what she takes at home as maint vs prns  Chief Complaint  Patient presents with   Follow-up    nasal congestion, runny nose for couple weeks   Dyspnea:  50 ft slowed by legs  Cough: assoc with pnds  Sleeping: sleeping upright due to pain not breathing problems  SABA use: ? name 02: 2lpm, at bedtime and  Prn  rec zpak for nasal problems The main problem you have is smoking  - it's not too late to quit If breathing getting worse I'll be happy to re-evaluate you if you bring all your respiratory medications/ devices including your portable system No regular pulmonary follow up is needed    07/20/2020  f/u ov/[place] office/Carynn Felling re:  GOLD 0 still smoking/ no maint rx   Chief Complaint  Patient presents with   Follow-up    Sinus drainage, clear in color for past year  Dyspnea:  Rides scooter when does [employer] - limited by legs > sob Cough: smoker's rattle,  clear mucus  Sleeping: sits up in chair to sleep x years  SABA use: total no more than  6 pffs per day, no maint rx  02: 2lpm hs / prn daytime  Covid status: vax x 2  Last one 11/2019   Rec Ent eval   PC  08/13/22  re cough  Rec She has muliple drug allergies so all I can offer her is doxycline  100 mg twice daily x 10 days and f/u ov in 2 weeks with all meds in hand to regroup     10/02/2022  f/u ov/[place] office/Karter Haire re: *** maint on *** did *** bring all meds  No chief complaint on file.   Dyspnea:  *** Cough: *** Sleeping: *** SABA use: *** 02: *** Covid status: *** Lung cancer screening: ***   No obvious day to day or daytime variability or assoc excess/ purulent sputum or mucus plugs or hemoptysis or cp or chest tightness, subjective wheeze or overt sinus or hb symptoms.   *** without nocturnal  or early am exacerbation  of respiratory  c/o's or need for noct saba. Also denies any obvious fluctuation of symptoms with weather or environmental changes or other aggravating or alleviating factors except as  outlined above   No unusual exposure hx or h/o childhood pna/ asthma or knowledge of premature birth.  Current Allergies, Complete Past Medical History, Past Surgical History, Family History, and Social History were reviewed in [employer] record.  ROS  The following are not active complaints unless bolded Hoarseness, sore throat, dysphagia, dental problems, itching, sneezing,  nasal congestion or discharge of excess mucus or purulent secretions, ear ache,   fever, chills, sweats, unintended wt loss or wt gain, classically pleuritic or exertional cp,  orthopnea pnd or arm/hand swelling  or leg swelling, presyncope, palpitations, abdominal pain, anorexia, nausea, vomiting, diarrhea  or change in bowel habits or change in bladder habits, change in stools or change in urine, dysuria, hematuria,  rash, arthralgias, visual complaints, headache, numbness,  weakness or ataxia or problems with walking or coordination,  change in mood or  memory.        No outpatient medications have been marked as taking for the 10/02/22 encounter (Appointment) with Nyoka Cowden, MD.                         Past Medical History:  Diagnosis Date   Back pain    Depression    Fibromyalgia    GERD (gastroesophageal reflux disease)    Hypertension    diet control   Hypothyroid    IBS (irritable bowel syndrome)    Migraines    Obesity    Osteoarthritis    Sleep apnea        Objective:     Wts  10/02/2022          ***  07/20/2020        158  02/02/2020        161   02/02/20 161 lb 6.4 oz (73.2 kg)  12/16/19 176 lb (79.8 kg)  10/22/19 176 lb (79.8 kg)    Vital signs reviewed  10/02/2022  - Note at rest 02 sats  ***% on ***   General appearance:    ***     Min barrel / mod severe kyphoscoliosis ***   CXR PA and Lateral:   07/20/2020 :    I personally reviewed images and agree with radiology impression as follows:    >> did not go for cxr as rec          Assessment

## 2022-10-02 ENCOUNTER — Ambulatory Visit (INDEPENDENT_AMBULATORY_CARE_PROVIDER_SITE_OTHER): Payer: Medicare Other | Admitting: Internal Medicine

## 2022-10-02 ENCOUNTER — Encounter: Payer: Self-pay | Admitting: Internal Medicine

## 2022-10-02 VITALS — BP 113/71 | HR 71 | Ht <= 58 in | Wt 148.2 lb

## 2022-10-02 DIAGNOSIS — J449 Chronic obstructive pulmonary disease, unspecified: Secondary | ICD-10-CM

## 2022-10-02 DIAGNOSIS — G4734 Idiopathic sleep related nonobstructive alveolar hypoventilation: Secondary | ICD-10-CM

## 2022-10-02 DIAGNOSIS — F17210 Nicotine dependence, cigarettes, uncomplicated: Secondary | ICD-10-CM | POA: Diagnosis not present

## 2022-10-02 MED ORDER — PREDNISONE 10 MG PO TABS: ORAL_TABLET | ORAL | 0 refills | Status: DC

## 2022-10-02 MED ORDER — BUDESONIDE-FORMOTEROL FUMARATE 80-4.5 MCG/ACT IN AERO: INHALATION_SPRAY | RESPIRATORY_TRACT | 12 refills | Status: AC

## 2022-10-02 NOTE — Patient Instructions (Addendum)
Plan A = Automatic = Always=    Symbicort 80 Take 2 puffs first thing in am and then another 2 puffs about 12 hours later.    Work on inhaler technique:  relax and gently blow all the way out then take a nice smooth full deep breath back in, triggering the inhaler at same time you start breathing in.  Hold breath in for at least  5 seconds if you can. Blow out symbicort  80  thru nose. Rinse and gargle with water when done.  If mouth or throat bother you at all,  try brushing teeth/gums/tongue with arm and hammer toothpaste/ make a slurry and gargle and spit out.       Plan B = Backup (to supplement plan A, not to replace it) Only use your albuterol inhaler as a rescue medication to be used if you can't catch your breath by resting or doing a relaxed purse lip breathing pattern.  - The less you use it, the better it will work when you need it. - Ok to use the inhaler up to 2 puffs  every 4 hours if you must but call for appointment if use goes up over your usual need - Don't leave home without it !!  (think of it like the spare tire for your car)   Re SABA :  I spent extra time with pt today reviewing appropriate use of albuterol for prn use on exertion with the following points: 1) saba is for relief of sob that does not improve by walking a slower pace or resting but rather if the pt does not improve after trying this first. 2) If the pt is convinced, as many are, that saba helps recover from activity faster then it's easy to tell if this is the case by re-challenging : ie stop, take the inhaler, then p 5 minutes try the exact same activity (intensity of workload) that just caused the symptoms and see if they are substantially diminished or not after saba 3) if there is an activity that reproducibly causes the symptoms, try the saba 15 min before the activity on alternate days   If in fact the saba really does help, then fine to continue to use it prn but advised may need to look closer at the  maintenance regimen being used to achieve better control of airways disease with exertion.   Prednisone 10 mg take  4 each am x 2 days,  2 each am x 2 days,  1 each am x 2 days and stop   My office will be contacting you by phone for referral to ENT in [place]   - if you don't hear back from my office within one week please call us back or notify us thru MyChart and we'll address it right away.    No mint products > ok to use jolley ranchers or life savers (not the white ones)   Make sure you check your oxygen saturation  AT  your highest level of activity (not after you stop)   to be sure it stays over 90% and adjust  02 flow upward to maintain this level if needed but remember to turn it back to previous settings when you stop (to conserve your supply).   We will try to get you approved for night time 02    The key is to stop smoking completely before smoking completely stops you!    Please schedule a follow up office visit in 6 weeks,  call sooner if needed with all medications /inhalers/ solutions in hand so we can verify exactly what you are taking. This includes all medications from all doctors and over the counters - PLEASE separate them into two bags:  the ones you take automatically, no matter what, vs the ones you take just when you feel you need them "BAG #2 is UP TO YOU"  - this will really help [place] help you take your medications more effectively.

## 2022-10-03 ENCOUNTER — Telehealth: Payer: Self-pay | Admitting: Internal Medicine

## 2022-10-03 DIAGNOSIS — G4734 Idiopathic sleep related nonobstructive alveolar hypoventilation: Secondary | ICD-10-CM | POA: Insufficient documentation

## 2022-10-03 NOTE — Assessment & Plan Note (Signed)
Active smoker / severe kyphosis  - PFT's  12/31/19   FEV1 1.87 (84 % ) ratio 0.84  p 14 % improvement from saba p albuterol prior to study with DLCO  17.65 (83%) corrects to 4.39 (94%)  for alv volume and FV curve min concavity   - 10/02/2022  After extensive coaching inhaler device,  effectiveness =  75% (short Ti) try symbicort 80 2bid  and  approp saba  prn   Rx as AB with VCD component with low dose symbicort / max gerd rx and ent eval for sinus dz and vcd.  Re SABA :  I spent extra time with pt today reviewing appropriate use of albuterol for prn use on exertion with the following points: 1) saba is for relief of sob that does not improve by walking a slower pace or resting but rather if the pt does not improve after trying this first. 2) If the pt is convinced, as many are, that saba helps recover from activity faster then it's easy to tell if this is the case by re-challenging : ie stop, take the inhaler, then p 5 minutes try the exact same activity (intensity of workload) that just caused the symptoms and see if they are substantially diminished or not after saba 3) if there is an activity that reproducibly causes the symptoms, try the saba 15 min before the activity on alternate days   If in fact the saba really does help, then fine to continue to use it prn but advised may need to look closer at the maintenance regimen being used to achieve better control of airways disease with exertion.

## 2022-10-03 NOTE — Telephone Encounter (Signed)
Refer for Lung cancer screening

## 2022-10-03 NOTE — Assessment & Plan Note (Signed)
She uses inogen at hs and prn   Rec ONO on RA to justify home concentrator  Make sure you check your oxygen saturation  AT  your highest level of activity (not after you stop)   to be sure it stays over 90% and adjust  02 flow upward to maintain this level if needed but remember to turn it back to previous settings when you stop (to conserve your supply).    F/u 6 weeks with all meds in hand using a trust but verify approach to confirm accurate Medication  Reconciliation The principal here is that until we are certain that the  patients are doing what we've asked, it makes no sense to ask them to do more.          Each maintenance medication was reviewed in detail including emphasizing most importantly the difference between maintenance and prns and under what circumstances the prns are to be triggered using an action plan format where appropriate.  Total time for H and P, chart review, counseling, reviewing hfa/02 device(s) and generating customized AVS unique to this office visit / same day charting > 30 min for multiple  refractory respiratory  symptoms of uncertain etiology

## 2022-10-09 ENCOUNTER — Other Ambulatory Visit (HOSPITAL_COMMUNITY): Payer: Self-pay

## 2022-10-09 ENCOUNTER — Telehealth: Payer: Self-pay

## 2022-10-09 NOTE — Telephone Encounter (Signed)
*  Pulm  PA request received for Symbicort 80-4.5MCG/ACT aerosol  PA not submitted due to preferred alternatives of: Dulera-$4.60 Breo-$4.60  Key: [id number]

## 2022-10-10 ENCOUNTER — Telehealth: Payer: Self-pay | Admitting: Adult Health

## 2022-10-10 NOTE — Telephone Encounter (Signed)
Dulera 100 Take 2 puffs first thing in am and then another 2 puffs about 12 hours later.  ?  ?

## 2022-10-10 NOTE — Telephone Encounter (Signed)
Patient called to see if there was an update on inhaler. Please advise.

## 2022-10-10 NOTE — Telephone Encounter (Signed)
See last encounter asking what drug store: [facility]   PT said.

## 2022-10-10 NOTE — Telephone Encounter (Signed)
ATC LMTCB x1 I need to know which pharmacy she would like for [place] to send inhaler to.

## 2022-10-12 ENCOUNTER — Other Ambulatory Visit: Payer: Self-pay | Admitting: Cardiology

## 2022-10-15 MED ORDER — MOMETASONE FURO-FORMOTEROL FUM 100-5 MCG/ACT IN AERO: 2.0000 | INHALATION_SPRAY | Freq: Two times a day (BID) | RESPIRATORY_TRACT | 11 refills | Status: AC

## 2022-10-15 NOTE — Telephone Encounter (Signed)
Rx was sent to pharm on file and I have left a detailed msg for the pt letting her know that this was done

## 2022-10-15 NOTE — Addendum Note (Signed)
Addended by: Christen Butter on: 10/15/2022 11:48 AM   Modules accepted: Orders

## 2022-10-15 NOTE — Telephone Encounter (Signed)
Rx was sent to preferred pharmacy already and msg left for the the pt letting her know that this was done  Closing encounter

## 2022-10-16 ENCOUNTER — Telehealth: Payer: Self-pay | Admitting: Orthopaedic Surgery

## 2022-10-30 ENCOUNTER — Telehealth: Payer: Self-pay | Admitting: Internal Medicine

## 2022-10-30 NOTE — Telephone Encounter (Signed)
Patient states since finishing her prednisone (about a week or 2 ago) her wheezing and breathing is getting worse. States she is using her inhalers as directed. Patient requesting more prednisone or what she should do. Uses [facility]. Please advise.

## 2022-10-31 MED ORDER — PREDNISONE 10 MG PO TABS: ORAL_TABLET | ORAL | 0 refills | Status: DC

## 2022-10-31 NOTE — Telephone Encounter (Signed)
Prednisone 10 mg take  4 each am x 2 days,   2 each am x 2 days,  1 each am x 2 days and stop   Ov with all meds in hand w/in 2 weeks

## 2022-10-31 NOTE — Telephone Encounter (Signed)
Patient calling back to check on status. Please advise.

## 2022-10-31 NOTE — Telephone Encounter (Signed)
Rx sent to pharmacy.  Appt scheduled.

## 2022-11-06 ENCOUNTER — Ambulatory Visit: Payer: Medicare Other | Admitting: Orthopaedic Surgery

## 2022-11-06 DIAGNOSIS — G894 Chronic pain syndrome: Secondary | ICD-10-CM | POA: Diagnosis not present

## 2022-11-06 DIAGNOSIS — G8929 Other chronic pain: Secondary | ICD-10-CM

## 2022-11-06 DIAGNOSIS — F17210 Nicotine dependence, cigarettes, uncomplicated: Secondary | ICD-10-CM

## 2022-11-06 DIAGNOSIS — M25512 Pain in left shoulder: Secondary | ICD-10-CM | POA: Diagnosis not present

## 2022-11-06 MED ORDER — OXYCODONE HCL 15 MG PO TABS: ORAL_TABLET | ORAL | 0 refills | Status: DC

## 2022-11-06 NOTE — Progress Notes (Signed)
PROCEDURE NOTE:  The patient request injection, verbal consent was obtained.  The left shoulder was prepped appropriately after time out was performed.   Sterile technique was observed and injection of 1 cc of DepoMedrol 40mg  with several cc's of plain xylocaine. Anesthesia was provided by ethyl chloride and a 20-gauge needle was used to inject the shoulder area. A posterior approach was used.  The injection was tolerated well.  A band aid dressing was applied.  The patient was advised to apply ice later today and tomorrow to the injection sight as needed.  Encounter Diagnoses  Name Primary?   Chronic left shoulder pain Yes   Cigarette nicotine dependence without complication    Pain syndrome, chronic    Return in three months.  I have reviewed the [place] Controlled Substance Reporting System web site prior to prescribing narcotic medicine for this patient.  Call if any problem.  Precautions discussed.  Electronically Signed Darreld Mclean, MD 8/6/20242:09 PM

## 2022-11-19 NOTE — Progress Notes (Deleted)
Marlinda Mike, female    DOB: Aug 17, 1960,    MRN: [id number]  Brief patient profile:  50 yowf active smoker with clinical dx of copd previously followed by Dr Juanetta Gosling referred to pulmonary clinic in [facility]  10/22/2019 by [name]   History of Present Illness / maint on ? elipta ?  10/22/2019  Pulmonary/ 1st office eval/Zoriah Pulice  Dyspnea: limited by feet /legs hurt x  years and uses either cane or walker  Cough: worse x 6-8 m esp when try to lie down > slt yellow mucus also in am  so usually sits up but also back prevents lying down flat  Sleep: not well due mostly due to pain  SABA use: none now, has albuterol  02 prn up to 2lpm  rec Continue your inhaler and work on hard to reduce if not eliminate all smoking  zpak should change the mucus back to clear Call us with the name of your inhaler   Please remember to go to the  x-ray department  for your tests - we will call you with the results when they are available         07/20/2020  f/u ov/[place] office/Claudene Gatliff re:  GOLD 0 still smoking/ no maint rx   Chief Complaint  Patient presents with   Follow-up    Sinus drainage, clear in color for past year  Dyspnea:  Rides scooter when does [employer] - limited by legs > sob Cough: smoker's rattle, clear mucus  Sleeping: sits up in chair to sleep x years  SABA use: total no more than  6 pffs per day, no maint rx  02: 2lpm hs / prn daytime  Covid status: vax x 2  Last one 11/2019   Rec Ent eval > not done    [facility]  08/13/22  re cough  Rec She has muliple drug allergies so all I can offer her is doxycline  100 mg twice daily x 10 days and f/u ov in 2 weeks with all meds in hand to regroup     10/02/2022  f/u ov/[place] office/Seva Chancy re: GOLD 0 COPD/ AB no maint rx/ did not  bring all meds as req/still smoking Chief Complaint  Patient presents with   Follow-up   Dyspnea:  limited by legs/ feet severe swelling R >L  Cough: doxy helped some but still variably green Sleeping:  sleeping in chair due to cough  SABA use: using about 4 x daily  02: sleeps on 2lpm /uses prn daytime Purse full of mints  Rec Plan A = Automatic = Always=    Symbicort 80 Take 2 puffs first thing in am and then another 2 puffs about 12 hours later.  Work on inhaler technique:    Plan B = Backup (to supplement plan A, not to replace it) Only use your albuterol inhaler as a rescue medication Prednisone 10 mg take  4 each am x 2 days,  2 each am x 2 days,  1 each am x 2 days and stop  My office will be contacting you by phone for referral to ENT in [facility]   No mint products > ok to use jolley ranchers or life savers (not the white ones)  Make sure you check your oxygen saturation  AT  your highest level of activity (not after you stop)   to be sure it stays over 90%   We will try to get you approved for night time 02  The key  is to stop smoking completely before smoking completely stops you! Please schedule a follow up office visit in 6 weeks, call sooner if needed with all medications /inhalers/ solutions in hand     11/20/2022  f/u ov/ office/Srihaan Mastrangelo re: COPD GOLD 0 maint on *** did *** bring meds  No chief complaint on file.   Dyspnea:  *** Cough: *** Sleeping: ***   resp cc  SABA use: *** 02: ***  Lung cancer screening: ***   No obvious day to day or daytime variability or assoc excess/ purulent sputum or mucus plugs or hemoptysis or cp or chest tightness, subjective wheeze or overt sinus or hb symptoms.    Also denies any obvious fluctuation of symptoms with weather or environmental changes or other aggravating or alleviating factors except as outlined above   No unusual exposure hx or h/o childhood pna/ asthma or knowledge of premature birth.  Current Allergies, Complete Past Medical History, Past Surgical History, Family History, and Social History were reviewed in [employer] record.  ROS  The following are not active complaints unless  bolded Hoarseness, sore throat, dysphagia, dental problems, itching, sneezing,  nasal congestion or discharge of excess mucus or purulent secretions, ear ache,   fever, chills, sweats, unintended wt loss or wt gain, classically pleuritic or exertional cp,  orthopnea pnd or arm/hand swelling  or leg swelling, presyncope, palpitations, abdominal pain, anorexia, nausea, vomiting, diarrhea  or change in bowel habits or change in bladder habits, change in stools or change in urine, dysuria, hematuria,  rash, arthralgias, visual complaints, headache, numbness, weakness or ataxia or problems with walking or coordination,  change in mood or  memory.        No outpatient medications have been marked as taking for the 11/20/22 encounter (Appointment) with Nyoka Cowden, MD.                        Past Medical History:  Diagnosis Date   Back pain    Depression    Fibromyalgia    GERD (gastroesophageal reflux disease)    Hypertension    diet control   Hypothyroid    IBS (irritable bowel syndrome)    Migraines    Obesity    Osteoarthritis    Sleep apnea        Objective:     Wts  11/20/2022        ***  10/02/2022          148  07/20/2020        158  02/02/2020        161   02/02/20 161 lb 6.4 oz (73.2 kg)  12/16/19 176 lb (79.8 kg)  10/22/19 176 lb (79.8 kg)   Vital signs reviewed  11/20/2022  - Note at rest 02 sats  ***% on ***   General appearance:    ***  Min barrel/mod severe kyphotic  ***         CXR PA and Lateral:   07/20/2020 :    I personally reviewed images and agree with radiology impression as follows:    >> did not go for cxr as rec          Assessment

## 2022-11-20 ENCOUNTER — Ambulatory Visit: Payer: Medicare Other | Admitting: Internal Medicine

## 2022-11-20 ENCOUNTER — Telehealth: Payer: Self-pay | Admitting: Cardiology

## 2022-11-20 NOTE — Telephone Encounter (Signed)
Spoke to pt who stated she has been having palpitations that come and go for the last 3-4w. Pt stated that she is unable to check her hr at this time. Pt stated that she does NOT have a hx of irregular hr/a fib. Pt stated that she has not passed out, but does feel like she may when she has the palpitations. Pt stated that the symptoms seem to be lasting longer.   Please advise.

## 2022-11-20 NOTE — Telephone Encounter (Signed)
Patient c/o Palpitations:  High priority if patient c/o lightheadedness, shortness of breath, or chest pain  How long have you had palpitations/irregular HR/ Afib? Are you having the symptoms now?   3-4 weeks ago, not at this moment  Are you currently experiencing lightheadedness, SOB or CP? No  Do you have a history of afib (atrial fibrillation) or irregular heart rhythm? Yes  Have you checked your BP or HR? (document readings if available): No  Are you experiencing any other symptoms?   Patient is calling because she has been having palpitations for 3-4 weeks now. Patient stated the palpitations would come and go, but now they are starting to last longer. Patient states when she has palpitation she feels SOB, tightness in her chest, and lightheadedness. Patient states sometimes she will see stars, like she is going to pass out. Please advise.

## 2022-11-21 ENCOUNTER — Telehealth: Payer: Self-pay

## 2022-11-21 NOTE — Telephone Encounter (Signed)
[name] is asking if you would write her a prescription for Voltaren Gel to use. I did tell her that it was over the counter now, but she said if you could write a script for it then it wouldn't come out of her pocket, cause she can't afford it.  PATIENT USES [facility]

## 2022-11-21 NOTE — Telephone Encounter (Signed)
Spoke to pt who is agreeable to wear Zio XT monitor for 2 weeks with a 6-8 week f/u with EP for palpitations. Pt stated that she would come Monday at 8:45 am for NV- monitor placement. Will fwd to scheduling for f/u appt.   Please schedule: EKG 6-8 w f/u EP

## 2022-11-22 ENCOUNTER — Telehealth: Payer: Self-pay | Admitting: *Deleted

## 2022-11-22 NOTE — Telephone Encounter (Signed)
Spoke with patient and she reports productive cough with mucus and some bright red. She does report nighttime temps of 101. She reports cough has been increasing for 3 weeks. She has tried several OTC cough syrups with no improvement.   Please advise, thanks!

## 2022-11-22 NOTE — Telephone Encounter (Signed)
Missed her last appt and not comfortable try to treay this over the phone so rec go to UC or ER or come to office in [place] in am and I'll work her in

## 2022-11-22 NOTE — Telephone Encounter (Signed)
Spoke with patient and she states she does not have transportation to get to [place]. She will go to UC in the morning. Nothing further needed at this time.

## 2022-11-22 NOTE — Telephone Encounter (Signed)
Patient requesting prednisone and states she has been coughing and wheezing.  Patient uses [facility] pharmacy  Please call and advise patient [phone]

## 2022-11-26 ENCOUNTER — Ambulatory Visit: Payer: Medicare Other

## 2022-11-28 NOTE — Telephone Encounter (Signed)
Patient called and is requesting a refill on   diclofenac Sodium (VOLTAREN) 1 % GEL   Pharmacy:  [facility]

## 2022-11-29 MED ORDER — DICLOFENAC SODIUM 1 % EX GEL: CUTANEOUS | 5 refills | Status: AC

## 2022-11-29 NOTE — Telephone Encounter (Signed)
I called Krishawna and explained that Voltaren Gel is over the counter and she can purchase it at [place] and other stores. She stated she understood.

## 2022-12-04 ENCOUNTER — Telehealth: Payer: Self-pay | Admitting: Cardiology

## 2022-12-04 ENCOUNTER — Ambulatory Visit: Payer: Medicare Other

## 2022-12-04 ENCOUNTER — Telehealth: Payer: Self-pay

## 2022-12-04 ENCOUNTER — Other Ambulatory Visit: Payer: Self-pay | Admitting: Nurse Practitioner

## 2022-12-04 ENCOUNTER — Ambulatory Visit: Payer: Medicare Other | Attending: Nurse Practitioner

## 2022-12-04 DIAGNOSIS — R002 Palpitations: Secondary | ICD-10-CM

## 2022-12-04 NOTE — Telephone Encounter (Signed)
Checking percert on the following patient for   14 Day ZIO XT/Peck/Branch

## 2022-12-04 NOTE — Telephone Encounter (Signed)
Oxycodone-Acetaminophen 15 MG Time Released Capsule   Qty 145 Capsules   TAKE 1 TABLET BY MOUTH 5 times DAILY AS NEEDED FOR pain Dispense: 145 tablet,     PATIENT USES [facility]

## 2022-12-05 MED ORDER — OXYCODONE HCL 15 MG PO TABS: ORAL_TABLET | ORAL | 0 refills | Status: DC

## 2022-12-05 NOTE — Addendum Note (Signed)
Addended by: Earnstine Regal on: 12/05/2022 10:20 PM   Modules accepted: Orders

## 2022-12-11 NOTE — Progress Notes (Signed)
[name], female    DOB: 11/28/1960,    MRN: [id number]  Brief patient profile:  21 yowf active smoker with clinical dx of copd previously followed by Dr Juanetta Gosling referred to pulmonary clinic in [facility]  10/22/2019 by [name]   History of Present Illness / maint on ? elipta ?  10/22/2019  Pulmonary/ 1st office eval/[name]  Dyspnea: limited by feet /legs hurt x  years and uses either cane or walker  Cough: worse x 6-8 m esp when try to lie down > slt yellow mucus also in am  so usually sits up but also back prevents lying down flat  Sleep: not well due mostly due to pain  SABA use: none now, has albuterol  02 prn up to 2lpm  rec Continue your inhaler and work on hard to reduce if not eliminate all smoking  zpak should change the mucus back to clear Call us with the name of your inhaler          07/20/2020  f/u ov/[name]/[name] re:  GOLD 0 still smoking/ no maint rx   Chief Complaint  Patient presents with   Follow-up    Sinus drainage, clear in color for past year  Dyspnea:  Rides scooter when does [employer] - limited by legs > sob Cough: smoker's rattle, clear mucus  Sleeping: sits up in chair to sleep x years  SABA use: total no more than  6 pffs per day, no maint rx  02: 2lpm hs / prn daytime  Covid status: vax x 2  Last one 11/2019   Rec Ent eval > not done    [facility]  08/13/22  re cough  Rec She has muliple drug allergies so all I can offer her is doxycline  100 mg twice daily x 10 days and f/u ov in 2 weeks with all meds in hand to regroup     10/02/2022  f/u ov/[name]/[name] re: GOLD 0 COPD/ AB no maint rx/ did not  bring all meds as req/still smoking Chief Complaint  Patient presents with   Follow-up   Dyspnea:  limited by legs/ feet severe swelling R >L  Cough: doxy helped some but still variably green Sleeping: sleeping in chair due to cough  SABA use: using about 4 x daily  02: sleeps on 2lpm /uses prn daytime Purse full of mints   Rec Plan A = Automatic = Always=    Symbicort 80 Take 2 puffs first thing in am and then another 2 puffs about 12 hours later.  Work on inhaler technique:    Plan B = Backup (to supplement plan A, not to replace it) Only use your albuterol inhaler as a rescue medication Prednisone 10 mg take  4 each am x 2 days,  2 each am x 2 days,  1 each am x 2 days and stop  My office will be contacting you by phone for referral to ENT in [facility]   No mint products > ok to use jolley ranchers or life savers (not the white ones)  Make sure you check your oxygen saturation  AT  your highest level of activity (not after you stop)   to be sure it stays over 90%   We will try to get you approved for night time 02  The key is to stop smoking completely before smoking completely stops you! Please schedule a follow up office visit in 6 weeks, call sooner if needed with all  medications /inhalers/ solutions in hand     12/13/2022  f/u ov/[name]/[name] re: COPD GOLD 0/AB  maint on dulera 100    Chief Complaint  Patient presents with   Follow-up   Dyspnea:  legs slow her down/ rollator  Cough: assoc with "wheeze" / nasal congestion with purulent secretions did improve transiently on 10 d augmentin / pred  Sleeping: poorly s 02 (misunderstood instructions) SABA use: 4 x daily (hfa 50%) - duelra empty 02: not sleeping with oxygen, not titrating daytime    No obvious day to day or daytime variability or assoc  mucus plugs or hemoptysis or cp or chest tightness, or overt  hb symptoms.    Also denies any obvious fluctuation of symptoms with weather or environmental changes or other aggravating or alleviating factors except as outlined above   No unusual exposure hx or h/o childhood pna/ asthma or knowledge of premature birth.  Current Allergies, Complete Past Medical History, Past Surgical History, Family History, and Social History were reviewed in [employer] record.  ROS   The following are not active complaints unless bolded Hoarseness, sore throat, dysphagia, dental problems, itching, sneezing,  nasal congestion or discharge of excess mucus or purulent secretions, ear ache,   fever, chills, sweats, unintended wt loss or wt gain, classically pleuritic or exertional cp,  orthopnea pnd or arm/hand swelling  or leg swelling, presyncope, palpitations, abdominal pain, anorexia, nausea, vomiting, diarrhea  or change in bowel habits or change in bladder habits, change in stools or change in urine, dysuria, hematuria,  rash, arthralgias, visual complaints, headache, numbness, weakness or ataxia or problems with walking/ uses rollator or coordination,  change in mood or  memory.        Current Meds  Medication Sig   beta carotene w/minerals (OCUVITE) tablet Take 1 tablet by mouth daily.   budesonide-formoterol (SYMBICORT) 80-4.5 MCG/ACT inhaler Take 2 puffs first thing in am and then another 2 puffs about 12 hours later.   diclofenac Sodium (VOLTAREN) 1 % GEL apply FOUR grams TO affected joints AS NEEDED FOUR TIMES DAILY   Docusate Sodium 100 MG capsule Take 100 mg by mouth 2 (two) times daily.   DULoxetine (CYMBALTA) 20 MG capsule Take 20 mg by mouth 2 (two) times daily.   esomeprazole (NEXIUM) 40 MG capsule Take 1 capsule (40 mg total) by mouth daily before breakfast.   hydrOXYzine (VISTARIL) 25 MG capsule Take 25 mg by mouth every 6 (six) hours as needed for anxiety.    ketoconazole (NIZORAL) 2 % cream Apply 1 Application topically 2 (two) times daily.   levothyroxine (SYNTHROID) 125 MCG tablet Take 125 mcg by mouth daily.   linaclotide (LINZESS) 290 MCG CAPS capsule Take 290 mcg by mouth daily before breakfast.   metoprolol tartrate (LOPRESSOR) 25 MG tablet TAKE 1 TABLET BY MOUTH TWICE DAILY (NEEDS APPOINTMENT FOR FURTHER REFILLS)   mometasone-formoterol (DULERA) 100-5 MCG/ACT AERO Inhale 2 puffs into the lungs in the morning and at bedtime.   Multiple Vitamins-Iron  (MULTIVITAMINS WITH IRON) TABS Take 1 tablet by mouth daily.   ondansetron (ZOFRAN-ODT) 4 MG disintegrating tablet Take 4 mg by mouth daily as needed.   oxyCODONE (ROXICODONE) 15 MG immediate release tablet TAKE 1 TABLET BY MOUTH 5 times DAILY AS NEEDED FOR pain   polyethylene glycol (MIRALAX / GLYCOLAX) packet Take 17 g by mouth 2 (two) times daily before a meal.   potassium chloride (MICRO-K) 10 MEQ CR capsule Take 20 mEq by mouth  daily.   pregabalin (LYRICA) 100 MG capsule Take 100 mg by mouth 2 (two) times daily.   Prucalopride Succinate (MOTEGRITY) 2 MG TABS Take 2 mg by mouth daily.   silver sulfADIAZINE (SILVADENE) 1 % cream APPLY TO THE AFFECTED AREA(S) TWICE DAILY UNTIL HEALED   spironolactone (ALDACTONE) 25 MG tablet TAKE 1/2 TABLET BY MOUTH DAILY - MUST SCHEDULE APPOINTMENT FOR MORE REFILLS   sucralfate (CARAFATE) 1 g tablet Take 1 g by mouth 4 (four) times daily.   SUMAtriptan (IMITREX) 100 MG tablet TAKE 1 TABLET BY MOUTH AT ONSET OF HEADACHE. MAY REPEAT ONCE IN TWO HOURS. NO MORE THAN TWO TABLETS IN 24 HOURS   tiZANidine (ZANAFLEX) 4 MG tablet Take 4 mg by mouth 3 (three) times daily as needed.   topiramate (TOPAMAX) 50 MG tablet Take 50 mg by mouth 2 (two) times daily.   torsemide (DEMADEX) 20 MG tablet TAKE 4 TABLETS BY MOUTH EVERY DAY   traZODone (DESYREL) 50 MG tablet Take 50 mg by mouth at bedtime.   triamcinolone lotion (KENALOG) 0.1 % Apply 1 Application topically 2 (two) times daily.   VENTOLIN HFA 108 (90 Base) MCG/ACT inhaler Inhale 1-2 puffs into the lungs every 6 (six) hours as needed for wheezing or shortness of breath.    Vitamin D, Ergocalciferol, (DRISDOL) 1.25 MG (50000 UNIT) CAPS capsule Take 50,000 Units by mouth once a week.                        Past Medical History:  Diagnosis Date   Back pain    Depression    Fibromyalgia    GERD (gastroesophageal reflux disease)    Hypertension    diet control   Hypothyroid    IBS (irritable bowel syndrome)     Migraines    Obesity    Osteoarthritis    Sleep apnea        Objective:     Wts  12/13/2022        161  10/02/2022          148  07/20/2020        158  02/02/2020        161   02/02/20 161 lb 6.4 oz (73.2 kg)  12/16/19 176 lb (79.8 kg)  10/22/19 176 lb (79.8 kg)   Vital signs reviewed  12/13/2022  - Note at rest 02 sats  91% on RA   General appearance:    chronically ill rollator dep wf nad  HEENT : Oropharynx  clear     NECK :  without  apparent JVD/ palpable Nodes/TM   LUNGS: no acc muscle use,  Min barrel /kypho scolitic contour chest wall with bilateral  slightly decreased bs and junky ronchi on L  and  without cough on insp or exp maneuvers and min  Hyperresonant  to  percussion bilaterally    CV:  RRR  no s3 or murmur or increase in P2, and no edema   ABD:  soft and nontender with pos end  insp Hoover's  in the supine position.  No bruits or organomegaly appreciated   MS:  Nl gait/ ext warm without deformities Or obvious joint restrictions  calf tenderness, cyanosis or clubbing     SKIN: warm and dry without lesions    NEURO:  alert, approp, nl sensorium with  no motor or cerebellar deficits apparent.        CXR PA and Lateral:   12/13/2022 :  I personally reviewed images and impression is as follows:     LUL atx          Assessment

## 2022-12-13 ENCOUNTER — Encounter: Payer: Self-pay | Admitting: Internal Medicine

## 2022-12-13 ENCOUNTER — Ambulatory Visit (INDEPENDENT_AMBULATORY_CARE_PROVIDER_SITE_OTHER): Payer: Medicare Other | Admitting: Internal Medicine

## 2022-12-13 ENCOUNTER — Ambulatory Visit (HOSPITAL_COMMUNITY)
Admission: RE | Admit: 2022-12-13 | Discharge: 2022-12-13 | Disposition: A | Discharge status: Home / Self Care | Payer: Medicare Other | Source: Ambulatory Visit | Attending: Internal Medicine | Admitting: Internal Medicine

## 2022-12-13 VITALS — BP 139/83 | HR 78 | Ht <= 58 in | Wt 161.0 lb

## 2022-12-13 DIAGNOSIS — J449 Chronic obstructive pulmonary disease, unspecified: Secondary | ICD-10-CM | POA: Insufficient documentation

## 2022-12-13 DIAGNOSIS — G4734 Idiopathic sleep related nonobstructive alveolar hypoventilation: Secondary | ICD-10-CM | POA: Diagnosis not present

## 2022-12-13 DIAGNOSIS — R053 Chronic cough: Secondary | ICD-10-CM

## 2022-12-13 DIAGNOSIS — F17210 Nicotine dependence, cigarettes, uncomplicated: Secondary | ICD-10-CM | POA: Diagnosis not present

## 2022-12-13 MED ORDER — AMOXICILLIN-POT CLAVULANATE 875-125 MG PO TABS
1.0000 | ORAL_TABLET | Freq: Two times a day (BID) | ORAL | 0 refills | Status: DC
Start: 2022-12-13 — End: 2023-01-07

## 2022-12-13 MED ORDER — PREDNISONE 10 MG PO TABS: ORAL_TABLET | ORAL | 0 refills | Status: DC

## 2022-12-13 MED ORDER — FAMOTIDINE 20 MG PO TABS
ORAL_TABLET | ORAL | 11 refills | Status: AC
Start: 2022-12-13 — End: ?

## 2022-12-13 NOTE — Patient Instructions (Addendum)
Please remember to go to the  x-ray department  for your tests - we will call you with the results when they are available    We will have your oxygen company add humidity to your oxygen and ask you to wear it every night  My office will be contacting you by phone for referral to ent in [place]   - if you don't hear back from my office within one week please call us back or notify us thru MyChart and we'll address it right away.   Prednisone 10 mg take  4 each am x 2 days,   2 each am x 2 days,  1 each am x 2 days and stop   Augmentin 875 mg take one pill twice daily  X 20 days - take at breakfast and supper with large glass of water.  It would help reduce the usual side effects (diarrhea and yeast infections) if you ate cultured yogurt at lunch.   Continue nexium Take 30-60 min before first meal of the day and add pepcid 20 mg an hour before bed  For cough/ congestion > mucinex or mucinex dm  up to maximum of  1200 mg every 12 hours as needed   Please schedule a follow up office visit in 6 weeks, call sooner if needed - bring inhalers

## 2022-12-14 NOTE — Assessment & Plan Note (Signed)
4-5 min discussion re active cigarette smoking in addition to office E&M  Ask about tobacco use:   ongoing Advise quitting   I took an extended  opportunity with this patient to outline the consequences of continued cigarette use  in airway disorders based on all the data we have from the multiple national lung health studies (perfomed over decades at millions of dollars in cost)  indicating that smoking cessation, not choice of inhalers or pulmonary physicians, is the most important aspect of her care.   Assess willingness:  Not committed at this point Assist in quit attempt:  Per PCP when ready Arrange follow up:   Follow up per Primary Care planned     Low-dose CT lung cancer screening is recommended for patients who are 72-52 years of age with a 20+ pack-year history of smoking and who are currently smoking or quit <=15 years ago. No coughing up blood  No unintentional weight loss of > 15 pounds in the last 6 months - pt is eligible for scanning yearly until 15 y p quits > referred

## 2022-12-14 NOTE — Assessment & Plan Note (Signed)
Active smoker / severe kyphosis  - PFT's  12/31/19   FEV1 1.87 (84 % ) ratio 0.84  p 14 % improvement from saba p albuterol prior to study with DLCO  17.65 (83%) corrects to 4.39 (94%)  for alv volume and FV curve min concavity   - 10/02/2022    try symbicort 80 2bid  and  approp saba  prn  - 12/13/2022  After extensive coaching inhaler device,  effectiveness =    75% (short ti)   AB / active smoking with new finding of ? LUL atx from mucus plug or tumor > rec max mucinex/ augmentin (for ? Sinus dz as well) and f/u with ct chest and sinus in 2 weeks

## 2022-12-14 NOTE — Assessment & Plan Note (Addendum)
As of 10/02/2022 using inogen 2 lpm hs and prn - added humidity for noct 02  12/13/2022      Not following instructions for 02 hs or titration of 02 daytime/ reviewed  Make sure you check your oxygen saturation  AT  your highest level of activity (not after you stop)   to be sure it stays over 90% and adjust  02 flow upward to maintain this level if needed but remember to turn it back to previous settings when you stop (to conserve your supply).

## 2022-12-14 NOTE — Assessment & Plan Note (Signed)
Counseled re importance of smoking cessation but did not meet time criteria for separate billing           Each maintenance medication was reviewed in detail including emphasizing most importantly the difference between maintenance and prns and under what circumstances the prns are to be triggered using an action plan format where appropriate.  Total time for H and P, chart review, counseling, reviewing hfa/02 pulse ox  device(s) and generating customized AVS unique to this office visit / same day charting = 30 min

## 2022-12-26 ENCOUNTER — Telehealth: Payer: Self-pay | Admitting: Orthopaedic Surgery

## 2022-12-26 NOTE — Telephone Encounter (Signed)
Patient called at 3:51 pm left voicemail for Kathie Rhodes to give her a call back.  I called the patient back and left her a voicemail we are returning your all

## 2022-12-27 ENCOUNTER — Telehealth: Payer: Self-pay | Admitting: Orthopaedic Surgery

## 2022-12-27 NOTE — Telephone Encounter (Signed)
This says hydrocodone I pended oxycodone, which is what she is taking

## 2022-12-27 NOTE — Telephone Encounter (Signed)
Dr. Sanjuan Dame pt - spoke w/the patient, she is requesting a refill on her Hydrocodone 15mg  to be sent to [facility].

## 2022-12-28 MED ORDER — OXYCODONE HCL 15 MG PO TABS: ORAL_TABLET | ORAL | 0 refills | Status: DC

## 2023-01-03 ENCOUNTER — Telehealth: Payer: Self-pay | Admitting: Internal Medicine

## 2023-01-03 NOTE — Telephone Encounter (Signed)
Clearly failing outpt rx and needs to go to ER - nothing else to offer over the phone

## 2023-01-03 NOTE — Telephone Encounter (Signed)
Patient is aware and voiced her understanding.  She refused ED and stated that "hopefully the good lord above be would come and get her because she is ready to go".    Routing to Dr. Sherene Sires as an Lorain Childes

## 2023-01-03 NOTE — Telephone Encounter (Signed)
Called and spoke to patient.   She reports of increased SOB, chest heaviness, prod cough with yellow to green sputum, wheezing, temp 101, chills and sweats x1wk Neg covid test Tuesday.  She wears 2L at bedtime and PRN. She does not monitor oxygen levels.  She is using albuterol Q4H and Symbicort BID.  Completed course of Augmentin 2 weeks ago.  Offered appt today. Patient unable to make appt due to transportation.  Dr. Sherene Sires, please advise. Thanks

## 2023-01-03 NOTE — Telephone Encounter (Signed)
Pt calling in bc she is extremely SOB, offered a 2:15 w/ Dr. Sherene Sires but she has no transportation.

## 2023-01-06 NOTE — Progress Notes (Unsigned)
[name], female    DOB: 02-20-1961,    MRN: [id number]  Brief patient profile:  10 yowf active smoker with clinical dx of copd previously followed by Dr Juanetta Gosling referred to pulmonary clinic in [facility]  10/22/2019 by [name]   History of Present Illness / maint on ? elipta ?  10/22/2019  Pulmonary/ 1st office eval/Jumanah Hynson  Dyspnea: limited by feet /legs hurt x  years and uses either cane or walker  Cough: worse x 6-8 m esp when try to lie down > slt yellow mucus also in am  so usually sits up but also back prevents lying down flat  Sleep: not well due mostly due to pain  SABA use: none now, has albuterol  02 prn up to 2lpm  rec Continue your inhaler and work on hard to reduce if not eliminate all smoking  zpak should change the mucus back to clear Call us with the name of your inhaler          07/20/2020  f/u ov/[place] office/Serge Main re:  GOLD 0 still smoking/ no maint rx   Chief Complaint  Patient presents with   Follow-up    Sinus drainage, clear in color for past year  Dyspnea:  Rides scooter when does [employer] - limited by legs > sob Cough: smoker's rattle, clear mucus  Sleeping: sits up in chair to sleep x years  SABA use: total no more than  6 pffs per day, no maint rx  02: 2lpm hs / prn daytime  Covid status: vax x 2  Last one 11/2019   Rec Ent eval > not done    [facility]  08/13/22  re cough  Rec She has muliple drug allergies so all I can offer her is doxycline  100 mg twice daily x 10 days and f/u ov in 2 weeks with all meds in hand to regroup     10/02/2022  f/u ov/[place] office/Lucillia Corson re: GOLD 0 COPD/ AB no maint rx/ did not  bring all meds as req/still smoking Chief Complaint  Patient presents with   Follow-up   Dyspnea:  limited by legs/ feet severe swelling R >L  Cough: doxy helped some but still variably green Sleeping: sleeping in chair due to cough  SABA use: using about 4 x daily  02: sleeps on 2lpm /uses prn daytime Purse full of mints   Rec Plan A = Automatic = Always=    Symbicort 80 Take 2 puffs first thing in am and then another 2 puffs about 12 hours later.  Work on inhaler technique:    Plan B = Backup (to supplement plan A, not to replace it) Only use your albuterol inhaler as a rescue medication Prednisone 10 mg take  4 each am x 2 days,  2 each am x 2 days,  1 each am x 2 days and stop  My office will be contacting you by phone for referral to ENT in [address] [facility]   No mint products > ok to use jolley ranchers or life savers (not the white ones)  Make sure you check your oxygen saturation  AT  your highest level of activity (not after you stop)   to be sure it stays over 90%   We will try to get you approved for night time 02  The key is to stop smoking completely before smoking completely stops you! Please schedule a follow up office visit in 6 weeks, call sooner if needed with all  medications /inhalers/ solutions in hand     12/13/2022  f/u ov/[place] office/Mikle Sternberg re: COPD GOLD 0/AB  maint on dulera 100    Chief Complaint  Patient presents with   Follow-up   Dyspnea:  legs slow her down/ rollator  Cough: assoc with "wheeze" / nasal congestion with purulent secretions did improve transiently on 10 d augmentin / pred  Sleeping: poorly s 02 (misunderstood instructions) SABA use: 4 x daily (hfa 50%) - dulera empty 02: not sleeping with oxygen, not titrating daytime  Rec We will have your oxygen company add humidity to your oxygen and ask you to wear it every night My office will be contacting you by phone for referral to ent in [place]   Prednisone 10 mg take  4 each am x 2 days,   2 each am x 2 days,  1 each am x 2 days and stop  Augmentin 875 mg take one pill twice daily  X 20 days  Continue nexium Take 30-60 min before first meal of the day and add pepcid 20 mg an hour before bed  For cough/ congestion > mucinex or mucinex dm  up to maximum of  1200 mg every 12 hours as needed   Please schedule a follow up  office visit in 6 weeks, call sooner if needed - bring inhalers    01/07/2023  f/u ov/[place] office/Kaislee Chao re: GOLD 0/AB maint on dulera  did  bring inhalers  ? LUL ATX on cxr  Chief Complaint  Patient presents with   COPD    Gold  Not able to figure out how to use her humidity for her home concentrator  New L ant and post cp x sev weeks  Dyspnea:  walks with rollator  Cough: green stuff worse at hs and in am x months not better with augmentin Sleeping: in sitting positoin "always" otherwise can't breath  SABA use: every 4-6 hours sometimes helps  02: sleeps 2lpm and prn daytime   Lung cancer screening: not done yet    No obvious day to day or daytime variability or assoc excess/ purulent sputum or mucus plugs or hemoptysis or cp or chest tightness, subjective wheeze or overt sinus or hb symptoms.    Also denies any obvious fluctuation of symptoms with weather or environmental changes or other aggravating or alleviating factors except as outlined above   No unusual exposure hx or h/o childhood pna/ asthma or knowledge of premature birth.  Current Allergies, Complete Past Medical History, Past Surgical History, Family History, and Social History were reviewed in [employer] record.  ROS  The following are not active complaints unless bolded Hoarseness, sore throat, dysphagia, dental problems, itching, sneezing,  nasal congestion or discharge of excess mucus or purulent secretions, ear ache,   fever x 1 m, chills, sweats, unintended wt loss or wt gain, classically pleuritic or exertional cp,  orthopnea pnd or arm/hand swelling  or leg swelling, presyncope, palpitations, abdominal pain, anorexia, nausea, vomiting, diarrhea  or change in bowel habits or change in bladder habits, change in stools or change in urine, dysuria, hematuria,  rash, arthralgias, visual complaints, headache, numbness, weakness or ataxia or problems with walking or coordination,  change in mood  or  memory.        Current Meds  Medication Sig   beta carotene w/minerals (OCUVITE) tablet Take 1 tablet by mouth daily.   budesonide-formoterol (SYMBICORT) 80-4.5 MCG/ACT inhaler Take 2 puffs first thing in am and then another  2 puffs about 12 hours later.   diclofenac Sodium (VOLTAREN) 1 % GEL apply FOUR grams TO affected joints AS NEEDED FOUR TIMES DAILY   Docusate Sodium 100 MG capsule Take 100 mg by mouth 2 (two) times daily.   DULoxetine (CYMBALTA) 60 MG capsule Take 60 mg by mouth daily.   esomeprazole (NEXIUM) 40 MG capsule Take 1 capsule (40 mg total) by mouth daily before breakfast.   famotidine (PEPCID) 20 MG tablet One after supper   hydrOXYzine (VISTARIL) 25 MG capsule Take 25 mg by mouth every 6 (six) hours as needed for anxiety.    ketoconazole (NIZORAL) 2 % cream Apply 1 Application topically 2 (two) times daily.   levothyroxine (SYNTHROID) 125 MCG tablet Take 125 mcg by mouth daily.   linaclotide (LINZESS) 290 MCG CAPS capsule Take 290 mcg by mouth daily before breakfast.   metoprolol tartrate (LOPRESSOR) 25 MG tablet TAKE 1 TABLET BY MOUTH TWICE DAILY (NEEDS APPOINTMENT FOR FURTHER REFILLS)   mometasone-formoterol (DULERA) 100-5 MCG/ACT AERO Inhale 2 puffs into the lungs in the morning and at bedtime.   Multiple Vitamins-Iron (MULTIVITAMINS WITH IRON) TABS Take 1 tablet by mouth daily.   ondansetron (ZOFRAN-ODT) 4 MG disintegrating tablet Take 4 mg by mouth daily as needed.   oxyCODONE (ROXICODONE) 15 MG immediate release tablet TAKE 1 TABLET BY MOUTH 5 times DAILY AS NEEDED FOR pain   polyethylene glycol (MIRALAX / GLYCOLAX) packet Take 17 g by mouth 2 (two) times daily before a meal.   potassium chloride (MICRO-K) 10 MEQ CR capsule Take 20 mEq by mouth daily.   pregabalin (LYRICA) 100 MG capsule Take 100 mg by mouth 2 (two) times daily.   Prucalopride Succinate (MOTEGRITY) 2 MG TABS Take 2 mg by mouth daily.   silver sulfADIAZINE (SILVADENE) 1 % cream APPLY TO THE  AFFECTED AREA(S) TWICE DAILY UNTIL HEALED   spironolactone (ALDACTONE) 25 MG tablet TAKE 1/2 TABLET BY MOUTH DAILY - MUST SCHEDULE APPOINTMENT FOR MORE REFILLS   sucralfate (CARAFATE) 1 g tablet Take 1 g by mouth 4 (four) times daily.   SUMAtriptan (IMITREX) 100 MG tablet TAKE 1 TABLET BY MOUTH AT ONSET OF HEADACHE. MAY REPEAT ONCE IN TWO HOURS. NO MORE THAN TWO TABLETS IN 24 HOURS   tiZANidine (ZANAFLEX) 4 MG tablet Take 4 mg by mouth 3 (three) times daily as needed.   topiramate (TOPAMAX) 50 MG tablet Take 50 mg by mouth 2 (two) times daily.   torsemide (DEMADEX) 20 MG tablet TAKE 4 TABLETS BY MOUTH EVERY DAY   traZODone (DESYREL) 50 MG tablet Take 50 mg by mouth at bedtime.   triamcinolone lotion (KENALOG) 0.1 % Apply 1 Application topically 2 (two) times daily.   VENTOLIN HFA 108 (90 Base) MCG/ACT inhaler Inhale 1-2 puffs into the lungs every 6 (six) hours as needed for wheezing or shortness of breath.    Vitamin D, Ergocalciferol, (DRISDOL) 1.25 MG (50000 UNIT) CAPS capsule Take 50,000 Units by mouth once a week.                     Past Medical History:  Diagnosis Date   Back pain    Depression    Fibromyalgia    GERD (gastroesophageal reflux disease)    Hypertension    diet control   Hypothyroid    IBS (irritable bowel syndrome)    Migraines    Obesity    Osteoarthritis    Sleep apnea        Objective:  Wts  01/07/2023        158  12/13/2022        161  10/02/2022          148  07/20/2020        158  02/02/2020        161   02/02/20 161 lb 6.4 oz (73.2 kg)  12/16/19 176 lb (79.8 kg)  10/22/19 176 lb (79.8 kg)   Vital signs reviewed  01/07/2023  - Note at rest 02 sats  95% on RA   General appearance:    chronically ill > stated age bent over rollator walking       HEENT : Oropharynx  clear   Nasal turbinates mild edema/ no polyps or purulent d/c   NECK :  without  apparent JVD/ palpable Nodes/TM    LUNGS: no acc muscle use,  Min barrel /severly  kyphotic  contour chest wall with bilateral  slightly decreased bs s audible wheeze and  without cough on insp or exp maneuvers and min  Hyperresonant  to  percussion bilaterally    CV:  RRR  no s3 or murmur or increase in P2, and no edema   ABD:  soft and nontender with pos end  insp Hoover's  in the supine position.  No bruits or organomegaly appreciated   MS:  Nl gait/ ext warm without deformities Or obvious joint restrictions  calf tenderness, cyanosis or clubbing     SKIN: warm and dry without lesions    NEURO:  alert, approp, nl sensorium with  no motor or cerebellar deficits apparent.             CXR PA and Lateral:   12/13/2022 :    I personally reviewed images and impression is as follows:     ?  LUL atx > radiology thinks not          Assessment

## 2023-01-07 ENCOUNTER — Telehealth (HOSPITAL_BASED_OUTPATIENT_CLINIC_OR_DEPARTMENT_OTHER): Payer: Self-pay

## 2023-01-07 ENCOUNTER — Encounter: Payer: Self-pay | Admitting: Internal Medicine

## 2023-01-07 ENCOUNTER — Ambulatory Visit (INDEPENDENT_AMBULATORY_CARE_PROVIDER_SITE_OTHER): Payer: 59 | Admitting: Internal Medicine

## 2023-01-07 VITALS — BP 131/78 | HR 78 | Ht <= 58 in | Wt 158.0 lb

## 2023-01-07 DIAGNOSIS — R053 Chronic cough: Secondary | ICD-10-CM | POA: Diagnosis not present

## 2023-01-07 DIAGNOSIS — J449 Chronic obstructive pulmonary disease, unspecified: Secondary | ICD-10-CM | POA: Diagnosis not present

## 2023-01-07 DIAGNOSIS — R918 Other nonspecific abnormal finding of lung field: Secondary | ICD-10-CM

## 2023-01-07 DIAGNOSIS — G4734 Idiopathic sleep related nonobstructive alveolar hypoventilation: Secondary | ICD-10-CM

## 2023-01-07 DIAGNOSIS — F17210 Nicotine dependence, cigarettes, uncomplicated: Secondary | ICD-10-CM

## 2023-01-07 MED ORDER — ALBUTEROL SULFATE (2.5 MG/3ML) 0.083% IN NEBU: 2.5000 mg | INHALATION_SOLUTION | Freq: Four times a day (QID) | RESPIRATORY_TRACT | 12 refills | Status: AC | PRN

## 2023-01-07 NOTE — Patient Instructions (Addendum)
Plan A = Automatic = Always=    DULERA 100  Take 30- 60 min before your first and last meals of the day   Work on inhaler technique:  relax and gently blow all the way out then take a nice smooth full deep breath back in, triggering the inhaler at same time you start breathing in.  Hold breath in for at least  5 seconds if you can. Blow out [employer]  thru nose. Rinse and gargle with water when done.  If mouth or throat bother you at all,  try brushing teeth/gums/tongue with arm and hammer toothpaste/ make a slurry and gargle and spit out.       Plan B = Backup (to supplement plan A, not to replace it) Only use your albuterol inhaler as a rescue medication to be used if you can't catch your breath by resting or doing a relaxed purse lip breathing pattern.  - The less you use it, the better it will work when you need it. - Ok to use the inhaler up to 2 puffs  every 4 hours if you must but call for appointment if use goes up over your usual need - Don't leave home without it !!  (think of it like the spare tire for your car)    Plan C = Crisis (instead of Plan B but only if Plan B stops working) - only use your albuterol nebulizer if you first try Plan B and it fails to help > ok to use the nebulizer up to every 4 hours but if start needing it regularly call for immediate appointment   My office will be contacting you by phone for referral to CT chest and sinus ASAP  - if you don't hear back from my office within one week please call us back or notify us thru MyChart and we'll address it right away.   The key is to stop smoking completely before smoking completely stops you!    If getting worse on above ABC plan go to ER there is nothing else we can do for you thru this office     Please schedule a follow up office visit in 6 weeks, call sooner if needed

## 2023-01-07 NOTE — Telephone Encounter (Signed)
Patient seen in office today and given CXR results. Nothing further needed at this time.

## 2023-01-07 NOTE — Telephone Encounter (Signed)
Left message informing patient of results and recommendations. 

## 2023-01-08 NOTE — Assessment & Plan Note (Signed)
Active smoker / severe kyphosis  - PFT's  12/31/19   FEV1 1.87 (84 % ) ratio 0.84  p 14 % improvement from saba p albuterol prior to study with DLCO  17.65 (83%) corrects to 4.39 (94%)  for alv volume and FV curve min concavity   - 10/02/2022    try symbicort 80 2bid  and  approp saba  prn  - 01/07/2023  After extensive coaching inhaler device,  effectiveness =    80% (short TI) so added neb saba as plan C/ no change dulera 100 2bid maint rx   DDX of  difficult airways management almost all start with A and  include Adherence, Ace Inhibitors, Acid Reflux, Active Sinus Disease, Alpha 1 Antitripsin deficiency, Anxiety masquerading as Airways dz,  ABPA,  Allergy(esp in young), Aspiration (esp in elderly), Adverse effects of meds,  Active smoking or vaping, A bunch of PE's (a small clot burden can't cause this syndrome unless there is already severe underlying pulm or vascular dz with poor reserve) plus two Bs  = Bronchiectasis and Beta blocker use..and one C= CHF   Adherence is always the initial "prime suspect" and is a multilayered concern that requires a "trust but verify" approach in every patient - starting with knowing how to use medications, especially inhalers, correctly, keeping up with refills and understanding the fundamental difference between maintenance and prns vs those medications only taken for a very short course and then stopped and not refilled.  - see hfa teaching/ add saba neb to improve delivery  - return Adherence is always the initial "prime suspect" and is a multilayered concern that requires a "trust but verify" approach in every patient - starting with knowing how to use medications, especially inhalers, correctly, keeping up with refills and understanding the fundamental difference between maintenance and prns vs those medications only taken for a very short course and then stopped and not refilled.   Active smoking at top of the rest of the usual list of suspects  -see sep a/p  ?  Active sinus Dz > sinus CT  ? Acid (or non-acid) GERD > always difficult to exclude as up to 75% of pts in some series report no assoc GI/ Heartburn symptoms> rec continue max (24h)  acid suppression and diet restrictions/ reviewed     ? Allergy/ asthma component > continue dulera 100 ICS  ? Anxiety > usually at the bottom of this list of usual suspects but   note already on psychotropics and may interfere with adherence and also interpretation of response or lack thereof to symptom management which can be quite subjective.   ? Bronchiectasis > CT chest

## 2023-01-08 NOTE — Assessment & Plan Note (Signed)
Counseled re importance of smoking cessation but did not meet time criteria for separate billing           Each maintenance medication was reviewed in detail including emphasizing most importantly the difference between maintenance and prns and under what circumstances the prns are to be triggered using an action plan format where appropriate.  Total time for H and P, chart review, counseling, reviewing hfa/neb/02 /pulse ox  device(s) and generating customized AVS unique to this office visit / same day charting  > 40 min for multiple  refractory respiratory  symptoms of uncertain etiology

## 2023-01-08 NOTE — Assessment & Plan Note (Signed)
As of 10/02/2022 using inogen 2 lpm hs and prn - added humidity for noct 02  12/13/2022 > not yet connected as of 01/07/2023 > advised pt to call DMI provider with problems with equipment

## 2023-01-08 NOTE — Assessment & Plan Note (Signed)
Concerned about localized wheeze/ squeaks on L with CP and new findings on cxr ? LUL atx > CT chest asap

## 2023-01-15 ENCOUNTER — Encounter: Payer: Self-pay | Admitting: Nurse Practitioner

## 2023-01-15 ENCOUNTER — Ambulatory Visit: Payer: 59 | Attending: Nurse Practitioner | Admitting: Nurse Practitioner

## 2023-01-15 VITALS — BP 130/80 | HR 72 | Ht 60.0 in | Wt 164.2 lb

## 2023-01-15 DIAGNOSIS — I10 Essential (primary) hypertension: Secondary | ICD-10-CM | POA: Diagnosis not present

## 2023-01-15 DIAGNOSIS — R002 Palpitations: Secondary | ICD-10-CM

## 2023-01-15 DIAGNOSIS — Z79899 Other long term (current) drug therapy: Secondary | ICD-10-CM

## 2023-01-15 DIAGNOSIS — R079 Chest pain, unspecified: Secondary | ICD-10-CM

## 2023-01-15 DIAGNOSIS — R600 Localized edema: Secondary | ICD-10-CM

## 2023-01-15 DIAGNOSIS — R0602 Shortness of breath: Secondary | ICD-10-CM

## 2023-01-15 DIAGNOSIS — J449 Chronic obstructive pulmonary disease, unspecified: Secondary | ICD-10-CM

## 2023-01-15 DIAGNOSIS — Z720 Tobacco use: Secondary | ICD-10-CM

## 2023-01-15 MED ORDER — METOPROLOL TARTRATE 25 MG PO TABS: ORAL_TABLET | ORAL | 1 refills | Status: DC

## 2023-01-15 NOTE — Patient Instructions (Addendum)
Medication Instructions:  Your physician has recommended you make the following change in your medication:  You make take  metoprolol tartrate (LOPRESSOR) 25 MG tablet twice daily, with 1 extra tablet daily as needed for palpitations   Labwork: BNP, BMET 1-2 weeks   Testing/Procedures: None  Follow-Up: Your physician recommends that you schedule a follow-up appointment in: 3 Months   Any Other Special Instructions Will Be Listed Below (If Applicable).  If you need a refill on your cardiac medications before your next appointment, please call your pharmacy.

## 2023-01-15 NOTE — Progress Notes (Unsigned)
Office Visit    Patient Name: [name] Date of Encounter: 01/15/2023 PCP:  Brandi Dubin, FNP [facility]  Cardiologist:  Brandi Rich, MD  Advanced Practice Provider:  Sharlene Dory, NP Electrophysiologist:  None   Chief Complaint and HPI    [name] is a 62 y.o. female with a hx of chest pain, HTN, hypothyroidism, chronic pain, fibromyalgia, migraines, T2DM, OSA, obesity, depression, tobacco abuse, and leg edema, who presents today for 6-8 week follow-up for palpitations.  [name] was previously arranged in 2018 for chest pain evaluation, patient no showed. Echocardiogram in 2020 revealed normal EF.  Was to have repeat NST in 2020, had episode of SHOB/syncope with wearing facemask, was evaluated in ED, workup negative for ACS. Pt refused to reschedule NST. Unable to tolerate Imdur.   Last seen by Dr. Dina Kelley on December 29, 2020. Was doing well at the time.   09/06/2022 - Seen for overdue 1 year follow-up. Admitted to chronic pain and arthritis symptoms, said, "I am in physical pain every day of my life." Noticed intermittent chest pain, described as shooting sensation, occurred at night, random in occurrence and denied any exertional symptoms, left arm numbness. Shortness of breath was chronic and stable. Denied any palpitations, syncope, presyncope, dizziness, orthopnea, PND, swelling or significant weight changes, acute bleeding, or claudication.   She contacted our office in August 2024 noting intermittent palpitations x 3-4 weeks, lasting longer over time, also noted chest tightness, SOB, and lightheadedness, and presyncope with these episodes. Zio XT monitor was ordered and arranged for further evaluation- see report below.   Today she presents for palpitations follow-up.  Patient says that when she wore her monitor she did not experience any palpitations at the time.  Describes her palpitations as being rare in occurrence, only  occurring every couple of weeks and lasting about 30 minutes to couple hours in duration. Continues to note chronic pain as well as, stable and chronic intermittent chest pain, described as shooting sensation, occurred at night, random in occurrence (similar to previous office visit) and denies any exertional symptoms. Shortness of breath continues to be chronic and stable. Denies any syncope, presyncope, dizziness, orthopnea, PND, significant weight changes, acute bleeding, or claudication.  Her leg swelling is chronic and stable per her report, she wraps her legs herself due to leg weeping.  Smoking 2 pack/day.   SH: Used to work in [employer].   EKGs/Labs/Other Studies Reviewed:   The following studies were reviewed today:  EKG:  EKG is not ordered today.    Preliminary cardiac monitor report 12/2022: Patient had a min HR of 49 bpm, max HR of 169 bpm, and avg HR of 75 bpm. Predominant underlying rhythm was Sinus Rhythm. 1 run of Ventricular Tachycardia occurred lasting 4 beats with a max rate of 164 bpm (avg 125 bpm). 36 Supraventricular Tachycardia  runs occurred, the run with the fastest interval lasting 5 beats with a max rate of 169 bpm, the longest lasting 12 beats with an avg rate of 127 bpm. Isolated SVEs were rare (<1.0%), SVE Couplets were rare (<1.0%), and SVE Triplets were rare (<1.0%).  Isolated VEs were rare (<1.0%, 7375), VE Couplets were rare (<1.0%, 41), and VE Triplets were rare (<1.0%, 1). Ventricular Trigeminy was present.   Echo 09/2018: 1. The left ventricle has normal systolic function with an ejection  fraction of 60-65%. The cavity size was normal. Left ventricular diastolic  parameters were normal. No evidence  of left ventricular regional wall  motion abnormalities.   2. The right ventricle has normal systolic function. The cavity was  normal. There is mildly increased right ventricular wall thickness.   3. Left atrial size was mildly dilated.   4. The mitral valve  is grossly normal. There is mild mitral annular  calcification present.   5. The tricuspid valve is grossly normal.   6. The aortic valve is grossly normal.   7. The aortic root is normal in size and structure.   8. The inferior vena cava was dilated in size with <50% respiratory  variability.  Review of Systems    All other systems reviewed and are otherwise negative except as noted above.  Physical Exam    VS:  BP 130/80   Pulse 72   Ht 5' (1.524 m)   Wt 164 lb 3.2 oz (74.5 kg)   SpO2 94%   BMI 32.07 kg/m  , BMI Body mass index is 32.07 kg/m.  Wt Readings from Last 3 Encounters:  01/15/23 164 lb 3.2 oz (74.5 kg)  01/07/23 158 lb (71.7 kg)  12/13/22 161 lb (73 kg)    GEN: Well nourished, well developed, in no acute distress. HEENT: normal. Neck: Supple, no JVD, carotid bruits, or masses. Cardiac: S1/S2, RRR, no murmurs, rubs, or gallops. No clubbing, cyanosis. Generalized, nonpitting edema.  Radials/PT 2+ and equal bilaterally.  Leg edema to BLE, with legs wrapped bilaterally. Respiratory:  Respirations regular and unlabored, inspiratory and expiratory wheezing to auscultation bilaterally. MS: Thoracic kyphosis and scoliosis noted, no deformity or atrophy. Skin: Pale, warm and dry, no rash. Neuro:  Strength and sensation are intact. Psych: Normal affect.  Assessment & Plan    Chest pain of uncertain etiology Etiology continues to not sound cardiac, but sounds MSK in nature, has chronic pain issues. Follows pain management clinic. Was previously arranged NST in past, continues to refuse NST as she was dropped off table during previous test. Previous EKG negative for acute ischemic changes. No indication for ischemic evaluation at this time. No medication changes at this time. ED precautions discussed. Continue to follow with PCP.  2. Palpitations Admits to rare palpitations at times, with episodes lasting 30 minutes to sometimes lasting hours in duration. Preliminary  report noted above and pt was asymptomatic while wearing the monitor. Continue Lopressor 25 mg BID, and instructed that she may take an extra 25 mg tablet daily PRN for palpitations. She verbalized understanding. Heart healthy diet encouraged.   HTN BP stable. Discussed to monitor BP at home at least 2 hours after medications and sitting for 5-10 minutes. No medication changes at this time. Heart healthy diet encouraged.   Leg edema Stable.  She has been wrapping her legs d/t issues with leg weeping. Will obtain proBNP and BMET. Continue current medication regimen.  If kidney function permits, will increase torsemide dosage or consider adding metolazone to medication regimen.  4. Shortness of breath This is chronic and stable per her report.  Echocardiogram in 2020 was unremarkable.  Dr. Wyline Mood recommended setting her up for [facility], however patient currently refuses to undergo this as she was previously dropped off the table in the past.  Patient has significant tobacco abuse that is contributing to her shortness of breath.  She has audible wheezing inspiratory and expiratory on exam today.  Continue follow-up with PCP and pulmonology.  5. Tobacco abuse Smoking cessation encouraged and discussed.   Disposition: Follow up in 6 month(s) with Brandi Rich, MD  or APP.  Signed, Brandi Dory, NP 01/15/2023, 3:15 PM [facility]

## 2023-01-16 ENCOUNTER — Telehealth: Payer: Self-pay | Admitting: Internal Medicine

## 2023-01-16 NOTE — Telephone Encounter (Signed)
Patient states she cannot find transportation to [facility] to have her CT scans done.  Would like for the orders to be transferred to [facility] in [place] since that is closer to home.  Please call [phone]

## 2023-01-22 ENCOUNTER — Ambulatory Visit (INDEPENDENT_AMBULATORY_CARE_PROVIDER_SITE_OTHER): Payer: 59 | Admitting: Orthopaedic Surgery

## 2023-01-22 ENCOUNTER — Encounter: Payer: Self-pay | Admitting: Orthopaedic Surgery

## 2023-01-22 DIAGNOSIS — G8929 Other chronic pain: Secondary | ICD-10-CM

## 2023-01-22 DIAGNOSIS — F17210 Nicotine dependence, cigarettes, uncomplicated: Secondary | ICD-10-CM

## 2023-01-22 DIAGNOSIS — G894 Chronic pain syndrome: Secondary | ICD-10-CM

## 2023-01-22 DIAGNOSIS — M25512 Pain in left shoulder: Secondary | ICD-10-CM

## 2023-01-22 MED ORDER — METHYLPREDNISOLONE ACETATE 40 MG/ML IJ SUSP
40.0000 mg | Freq: Once | INTRAMUSCULAR | Status: AC
Start: 2023-01-22 — End: 2023-01-22
  Administered 2023-01-22: 40 mg via INTRA_ARTICULAR

## 2023-01-22 MED ORDER — OXYCODONE HCL 15 MG PO TABS: ORAL_TABLET | ORAL | 0 refills | Status: DC

## 2023-01-22 NOTE — Progress Notes (Signed)
PROCEDURE NOTE:  The patient request injection, verbal consent was obtained.  The left shoulder was prepped appropriately after time out was performed.   Sterile technique was observed and injection of 1 cc of DepoMedrol 40mg  with several cc's of plain xylocaine. Anesthesia was provided by ethyl chloride and a 20-gauge needle was used to inject the shoulder area. A posterior approach was used.  The injection was tolerated well.  A band aid dressing was applied.  The patient was advised to apply ice later today and tomorrow to the injection sight as needed.  Encounter Diagnoses  Name Primary?   Chronic left shoulder pain Yes   Cigarette nicotine dependence without complication    Pain syndrome, chronic    I have reviewed the [place] Controlled Substance Reporting System web site prior to prescribing narcotic medicine for this patient.  Return in six weeks.  Call if any problem.  Precautions discussed.  Electronically Signed Darreld Mclean, MD 10/22/20242:20 PM

## 2023-01-22 NOTE — Patient Instructions (Signed)
Steps to Quit Smoking Smoking tobacco is the leading cause of preventable death. It can affect almost every organ in the body. Smoking puts you and people around you at risk for many serious, long-lasting (chronic) diseases. Quitting smoking can be hard, but it is one of the best things that you can do for your health. It is never too late to quit. Do not give up if you cannot quit the first time. Some people need to try many times to quit. Do your best to stick to your quit plan, and talk with your doctor if you have any questions or concerns. How do I get ready to quit? Pick a date to quit. Set a date within the next 2 weeks to give you time to prepare. Write down the reasons why you are quitting. Keep this list in places where you will see it often. Tell your family, friends, and co-workers that you are quitting. Their support is important. Talk with your doctor about the choices that may help you quit. Find out if your health insurance will pay for these treatments. Know the people, places, things, and activities that make you want to smoke (triggers). Avoid them. What first steps can I take to quit smoking? Throw away all cigarettes at home, at work, and in your car. Throw away the things that you use when you smoke, such as ashtrays and lighters. Clean your car. Empty the ashtray. Clean your home, including curtains and carpets. What can I do to help me quit smoking? Talk with your doctor about taking medicines and seeing a counselor. You are more likely to succeed when you do both. If you are pregnant or breastfeeding: Talk with your doctor about counseling or other ways to quit smoking. Do not take medicine to help you quit smoking unless your doctor tells you to. Quit right away Quit smoking completely, instead of slowly cutting back on how much you smoke over a period of time. Stopping smoking right away may be more successful than slowly quitting. Go to counseling. In-person is best  if this is an option. You are more likely to quit if you go to counseling sessions regularly. Take medicine You may take medicines to help you quit. Some medicines need a prescription, and some you can buy over-the-counter. Some medicines may contain a drug called nicotine to replace the nicotine in cigarettes. Medicines may: Help you stop having the desire to smoke (cravings). Help to stop the problems that come when you stop smoking (withdrawal symptoms). Your doctor may ask you to use: Nicotine patches, gum, or lozenges. Nicotine inhalers or sprays. Non-nicotine medicine that you take by mouth. Find resources Find resources and other ways to help you quit smoking and remain smoke-free after you quit. They include: Online chats with a Veterinary surgeon. Phone quitlines. Printed Materials engineer. Support groups or group counseling. Text messaging programs. Mobile phone apps. Use apps on your mobile phone or tablet that can help you stick to your quit plan. Examples of free services include Quit Guide from the CDC and smokefree.gov  What can I do to make it easier to quit?  Talk to your family and friends. Ask them to support and encourage you. Call a phone quitline, such as 1-800-QUIT-NOW, reach out to support groups, or work with a Veterinary surgeon. Ask people who smoke to not smoke around you. Avoid places that make you want to smoke, such as: Bars. Parties. Smoke-break areas at work. Spend time with people who do not smoke. Lower  the stress in your life. Stress can make you want to smoke. Try these things to lower stress: Getting regular exercise. Doing deep-breathing exercises. Doing yoga. Meditating. What benefits will I see if I quit smoking? Over time, you may have: A better sense of smell and taste. Less coughing and sore throat. A slower heart rate. Lower blood pressure. Clearer skin. Better breathing. Fewer sick days. Summary Quitting smoking can be hard, but it is one of  the best things that you can do for your health. Do not give up if you cannot quit the first time. Some people need to try many times to quit. When you decide to quit smoking, make a plan to help you succeed. Quit smoking right away, not slowly over a period of time. When you start quitting, get help and support to keep you smoke-free. This information is not intended to replace advice given to you by your health care provider. Make sure you discuss any questions you have with your health care provider. Document Revised: 03/10/2021 Document Reviewed: 03/10/2021 Elsevier Patient Education  2024 [employer].

## 2023-01-23 ENCOUNTER — Other Ambulatory Visit: Payer: Self-pay

## 2023-01-23 ENCOUNTER — Other Ambulatory Visit: Payer: Self-pay | Admitting: Nurse Practitioner

## 2023-01-23 DIAGNOSIS — R0602 Shortness of breath: Secondary | ICD-10-CM

## 2023-01-23 DIAGNOSIS — Z79899 Other long term (current) drug therapy: Secondary | ICD-10-CM

## 2023-01-23 DIAGNOSIS — R609 Edema, unspecified: Secondary | ICD-10-CM

## 2023-01-23 MED ORDER — TORSEMIDE 20 MG PO TABS: 80.0000 mg | ORAL_TABLET | Freq: Every day | ORAL | 1 refills | Status: DC

## 2023-01-23 MED ORDER — POTASSIUM CHLORIDE ER 10 MEQ PO CPCR: 40.0000 meq | ORAL_CAPSULE | Freq: Every day | ORAL | 1 refills | Status: DC

## 2023-01-24 ENCOUNTER — Telehealth: Payer: Self-pay | Admitting: Cardiology

## 2023-01-24 ENCOUNTER — Ambulatory Visit: Payer: Medicare Other | Admitting: Internal Medicine

## 2023-01-24 NOTE — Telephone Encounter (Signed)
Paper have been mailed to patient

## 2023-01-24 NOTE — Telephone Encounter (Signed)
Papers have been mailed to patient left voicemail x2

## 2023-01-24 NOTE — Telephone Encounter (Signed)
Patient states she dropped off paperwork for the [facility] during her appointment on 10/15. She is requesting updates.

## 2023-01-24 NOTE — Telephone Encounter (Signed)
Patient returned staff call. 

## 2023-01-29 NOTE — Telephone Encounter (Signed)
Spoke to patient and helped her get transport lined up through her insurance. Patient scheduled for CT at [facility] on 11/5 since she wants DR Sherene Sires to immediately get the results. Patient ok with coming to [facility].

## 2023-01-30 NOTE — Telephone Encounter (Signed)
Patient was returning phone call 

## 2023-01-30 NOTE — Telephone Encounter (Signed)
Patient verbalized understanding and papers have been refaxed [facility]

## 2023-02-05 ENCOUNTER — Ambulatory Visit (HOSPITAL_COMMUNITY): Admission: RE | Admit: 2023-02-05 | Payer: 59 | Source: Ambulatory Visit

## 2023-02-12 ENCOUNTER — Ambulatory Visit: Payer: Medicare Other | Admitting: Orthopaedic Surgery

## 2023-02-13 ENCOUNTER — Telehealth: Payer: Self-pay | Admitting: Orthopaedic Surgery

## 2023-02-13 ENCOUNTER — Encounter: Payer: Self-pay | Admitting: Orthopaedic Surgery

## 2023-02-13 ENCOUNTER — Ambulatory Visit (INDEPENDENT_AMBULATORY_CARE_PROVIDER_SITE_OTHER): Payer: 59 | Admitting: Orthopaedic Surgery

## 2023-02-13 DIAGNOSIS — F17210 Nicotine dependence, cigarettes, uncomplicated: Secondary | ICD-10-CM

## 2023-02-13 DIAGNOSIS — G8929 Other chronic pain: Secondary | ICD-10-CM

## 2023-02-13 DIAGNOSIS — M25512 Pain in left shoulder: Secondary | ICD-10-CM

## 2023-02-13 DIAGNOSIS — G894 Chronic pain syndrome: Secondary | ICD-10-CM | POA: Diagnosis not present

## 2023-02-13 MED ORDER — METHYLPREDNISOLONE ACETATE 40 MG/ML IJ SUSP
40.0000 mg | Freq: Once | INTRAMUSCULAR | Status: AC
  Administered 2023-02-13: 40 mg via INTRA_ARTICULAR

## 2023-02-13 NOTE — Progress Notes (Signed)
PROCEDURE NOTE:  The patient request injection, verbal consent was obtained.  The left shoulder was prepped appropriately after time out was performed.   Sterile technique was observed and injection of 1 cc of DepoMedrol 40mg  with several cc's of plain xylocaine. Anesthesia was provided by ethyl chloride and a 20-gauge needle was used to inject the shoulder area. A posterior approach was used.  The injection was tolerated well.  A band aid dressing was applied.  The patient was advised to apply ice later today and tomorrow to the injection sight as needed.  Encounter Diagnoses  Name Primary?   Chronic left shoulder pain Yes   Cigarette nicotine dependence without complication    Pain syndrome, chronic    Return in two months.  Call if any problem.  Precautions discussed.  Electronically Signed Darreld Mclean, MD 11/13/20241:31 PM

## 2023-02-13 NOTE — Telephone Encounter (Signed)
Dr. Sanjuan Dame pt - spoke w/the pt, she stated she forgot to ask for a refill while she was here today, she is requesting a refill on Oxycodone 15mg  to be sent to [facility].

## 2023-02-13 NOTE — Addendum Note (Signed)
Addended by: Michaele Offer on: 02/13/2023 03:16 PM   Modules accepted: Orders

## 2023-02-14 MED ORDER — OXYCODONE HCL 15 MG PO TABS: ORAL_TABLET | ORAL | 0 refills | Status: DC

## 2023-02-17 NOTE — Progress Notes (Deleted)
[name], female    DOB: [birth year],    MRN: [id number]  Brief patient profile:  68 yowf active smoker with clinical dx of copd previously followed by Dr Juanetta Gosling referred to pulmonary clinic in [facility]  10/22/2019 by [name]   History of Present Illness / maint on ? elipta ?  10/22/2019  Pulmonary/ 1st office eval/[name]  Dyspnea: limited by feet /legs hurt x  years and uses either cane or walker  Cough: worse x 6-8 m esp when try to lie down > slt yellow mucus also in am  so usually sits up but also back prevents lying down flat  Sleep: not well due mostly due to pain  SABA use: none now, has albuterol  02 prn up to 2lpm  rec Continue your inhaler and work on hard to reduce if not eliminate all smoking  zpak should change the mucus back to clear Call us with the name of your inhaler          07/20/2020  f/u ov/[place] office/[name] re:  GOLD 0 still smoking/ no maint rx   Chief Complaint  Patient presents with   Follow-up    Sinus drainage, clear in color for past year  Dyspnea:  Rides scooter when does [employer] - limited by legs > sob Cough: smoker's rattle, clear mucus  Sleeping: sits up in chair to sleep x years  SABA use: total no more than  6 pffs per day, no maint rx  02: 2lpm hs / prn daytime  Covid status: vax x 2  Last one 11/2019   Rec Ent eval > not done    [facility]  08/13/22  re cough  Rec She has muliple drug allergies so all I can offer her is doxycline  100 mg twice daily x 10 days and f/u ov in 2 weeks with all meds in hand to regroup     10/02/2022  f/u ov/[place] office/[name] re: GOLD 0 COPD/ AB no maint rx/ did not  bring all meds as req/still smoking Chief Complaint  Patient presents with   Follow-up   Dyspnea:  limited by legs/ feet severe swelling R >L  Cough: doxy helped some but still variably green Sleeping: sleeping in chair due to cough  SABA use: using about 4 x daily  02: sleeps on 2lpm /uses prn daytime Purse full of mints   Rec Plan A = Automatic = Always=    Symbicort 80 Take 2 puffs first thing in am and then another 2 puffs about 12 hours later.  Work on inhaler technique:    Plan B = Backup (to supplement plan A, not to replace it) Only use your albuterol inhaler as a rescue medication Prednisone 10 mg take  4 each am x 2 days,  2 each am x 2 days,  1 each am x 2 days and stop  My office will be contacting you by phone for referral to ENT in [facility]   No mint products > ok to use jolley ranchers or life savers (not the white ones)  Make sure you check your oxygen saturation  AT  your highest level of activity (not after you stop)   to be sure it stays over 90%   We will try to get you approved for night time 02  The key is to stop smoking completely before smoking completely stops you! Please schedule a follow up office visit in 6 weeks, call sooner if needed with all  medications /inhalers/ solutions in hand     12/13/2022  f/u ov/[place] office/[name] re: COPD GOLD 0/AB  maint on dulera 100    Chief Complaint  Patient presents with   Follow-up   Dyspnea:  legs slow her down/ rollator  Cough: assoc with "wheeze" / nasal congestion with purulent secretions did improve transiently on 10 d augmentin / pred  Sleeping: poorly s 02 (misunderstood instructions) SABA use: 4 x daily (hfa 50%) - dulera empty 02: not sleeping with oxygen, not titrating daytime  Rec We will have your oxygen company add humidity to your oxygen and ask you to wear it every night My office will be contacting you by phone for referral to ent in [place]   Prednisone 10 mg take  4 each am x 2 days,   2 each am x 2 days,  1 each am x 2 days and stop  Augmentin 875 mg take one pill twice daily  X 20 days  Continue nexium Take 30-60 min before first meal of the day and add pepcid 20 mg an hour before bed  For cough/ congestion > mucinex or mucinex dm  up to maximum of  1200 mg every 12 hours as needed   Please schedule a follow up  office visit in 6 weeks, call sooner if needed - bring inhalers    01/07/2023  f/u ov/[place] office/[name] re: GOLD 0/AB maint on dulera  did  bring inhalers  ? LUL ATX on cxr  Chief Complaint  Patient presents with   COPD    Gold  Not able to figure out how to use her humidity for her home concentrator  New L ant and post cp x sev weeks  Dyspnea:  walks with rollator  Cough: green stuff worse at hs and in am x months not better with augmentin Sleeping: in sitting positoin "always" otherwise can't breath  SABA use: every 4-6 hours sometimes helps  02: sleeps 2lpm and prn daytime  Rec Plan A = Automatic = Always=    DULERA 100  Take 30- 60 min before your first and last meals of the day  Work on inhaler technique: Plan B = Backup (to supplement plan A, not to replace it) Only use your albuterol inhaler as a rescue medication Plan C = Crisis (instead of Plan B but only if Plan B stops working) - only use your albuterol nebulizer if you first try Plan B  My office will be contacting you by phone for referral to CT chest and sinus ASAP> not done as of 02/18/2023    If getting worse on above ABC plan go to ER there is nothing else we can do for you thru this office        02/18/2023  f/u ov/ office/[name] re: *** maint on ***  ct sinus / chest ***  No chief complaint on file.   Dyspnea:  *** Cough: *** Sleeping: ***   resp cc  SABA use: *** 02: ***  Lung cancer screening: ***   No obvious day to day or daytime variability or assoc excess/ purulent sputum or mucus plugs or hemoptysis or cp or chest tightness, subjective wheeze or overt sinus or hb symptoms.    Also denies any obvious fluctuation of symptoms with weather or environmental changes or other aggravating or alleviating factors except as outlined above   No unusual exposure hx or h/o childhood pna/ asthma or knowledge of premature birth.  Current Allergies, Complete Past Medical History,  Past Surgical  History, Family History, and Social History were reviewed in [employer] record.  ROS  The following are not active complaints unless bolded Hoarseness, sore throat, dysphagia, dental problems, itching, sneezing,  nasal congestion or discharge of excess mucus or purulent secretions, ear ache,   fever, chills, sweats, unintended wt loss or wt gain, classically pleuritic or exertional cp,  orthopnea pnd or arm/hand swelling  or leg swelling, presyncope, palpitations, abdominal pain, anorexia, nausea, vomiting, diarrhea  or change in bowel habits or change in bladder habits, change in stools or change in urine, dysuria, hematuria,  rash, arthralgias, visual complaints, headache, numbness, weakness or ataxia or problems with walking or coordination,  change in mood or  memory.        No outpatient medications have been marked as taking for the 02/18/23 encounter (Appointment) with Nyoka Cowden, MD.                 Past Medical History:  Diagnosis Date   Back pain    Depression    Fibromyalgia    GERD (gastroesophageal reflux disease)    Hypertension    diet control   Hypothyroid    IBS (irritable bowel syndrome)    Migraines    Obesity    Osteoarthritis    Sleep apnea        Objective:     Wts  02/18/2023       ***  01/07/2023        158  12/13/2022        161  10/02/2022          148  07/20/2020        158  02/02/2020        161   02/02/20 161 lb 6.4 oz (73.2 kg)  12/16/19 176 lb (79.8 kg)  10/22/19 176 lb (79.8 kg)   Vital signs reviewed  02/18/2023  - Note at rest 02 sats  ***% on ***   General appearance:    ***       Min barrel /severly kyphotic***            CXR PA and Lateral:   12/13/2022 :    I personally reviewed images and impression is as follows:     ?  LUL atx > radiology thinks not          Assessment

## 2023-02-18 ENCOUNTER — Ambulatory Visit: Payer: 59 | Admitting: Internal Medicine

## 2023-03-05 ENCOUNTER — Ambulatory Visit: Payer: 59 | Admitting: Orthopaedic Surgery

## 2023-03-06 ENCOUNTER — Ambulatory Visit: Payer: 59 | Admitting: Orthopaedic Surgery

## 2023-03-13 NOTE — Progress Notes (Unsigned)
[name], female    DOB: 11/24/1960,    MRN: [id number]  Brief patient profile:  22 yowf active smoker with clinical dx of copd previously followed by Dr Juanetta Gosling referred to pulmonary clinic in [facility]  10/22/2019 by [name] but did not meet criteria for copd in 12/2019    History of Present Illness / maint on ? elipta ?  10/22/2019  Pulmonary/ 1st office eval/[name]  Dyspnea: limited by feet /legs hurt x  years and uses either cane or walker  Cough: worse x 6-8 m esp when try to lie down > slt yellow mucus also in am  so usually sits up but also back prevents lying down flat  Sleep: not well due mostly due to pain  SABA use: none now, has albuterol  02 prn up to 2lpm  rec Continue your inhaler and work on hard to reduce if not eliminate all smoking  zpak should change the mucus back to clear Call us with the name of your inhaler       07/20/2020  f/u ov/[place] office/[name] re:  GOLD 0 still smoking/ no maint rx   Chief Complaint  Patient presents with   Follow-up    Sinus drainage, clear in color for past year  Dyspnea:  Rides scooter when does [employer] - limited by legs > sob Cough: smoker's rattle, clear mucus  Sleeping: sits up in chair to sleep x years  SABA use: total no more than  6 pffs per day, no maint rx  02: 2lpm hs / prn daytime  Covid status: vax x 2  Last one 11/2019   Rec Ent eval > not done    01/07/2023  f/u ov/[place] office/[name] re: GOLD 0/AB maint on dulera  did  bring inhalers  ? LUL ATX on cxr  Chief Complaint  Patient presents with   COPD    Gold  Not able to figure out how to use her humidity for her home concentrator  New L ant and post cp x sev weeks  Dyspnea:  walks with rollator  Cough: green stuff worse at hs and in am x months not better with augmentin Sleeping: in sitting positoin "always" otherwise can't breath  SABA use: every 4-6 hours sometimes helps  02: sleeps 2lpm and prn daytime  Rec Plan A = Automatic =  Always=    DULERA 100  Take 30- 60 min before your first and last meals of the day  Work on inhaler technique: Plan B = Backup (to supplement plan A, not to replace it) Only use your albuterol inhaler as a rescue medication Plan C = Crisis (instead of Plan B but only if Plan B stops working) - only use your albuterol nebulizer if you first try Plan B  My office will be contacting you by phone for referral to CT chest and sinus ASAP> not done as of 03/15/2023    If getting worse on above ABC plan go to ER there is nothing else we can do for you thru this office        03/15/2023  f/u ov/[place] office/[name] re: GOLD 0 /AB maint on ***  ct sinus / chest ***  No chief complaint on file.   Dyspnea:  *** Cough: *** Sleeping: ***   resp cc  SABA use: *** 02: ***  Lung cancer screening: ***   No obvious day to day or daytime variability or assoc excess/ purulent sputum or mucus plugs or  hemoptysis or cp or chest tightness, subjective wheeze or overt sinus or hb symptoms.    Also denies any obvious fluctuation of symptoms with weather or environmental changes or other aggravating or alleviating factors except as outlined above   No unusual exposure hx or h/o childhood pna/ asthma or knowledge of premature birth.  Current Allergies, Complete Past Medical History, Past Surgical History, Family History, and Social History were reviewed in [employer] record.  ROS  The following are not active complaints unless bolded Hoarseness, sore throat, dysphagia, dental problems, itching, sneezing,  nasal congestion or discharge of excess mucus or purulent secretions, ear ache,   fever, chills, sweats, unintended wt loss or wt gain, classically pleuritic or exertional cp,  orthopnea pnd or arm/hand swelling  or leg swelling, presyncope, palpitations, abdominal pain, anorexia, nausea, vomiting, diarrhea  or change in bowel habits or change in bladder habits, change in stools or  change in urine, dysuria, hematuria,  rash, arthralgias, visual complaints, headache, numbness, weakness or ataxia or problems with walking or coordination,  change in mood or  memory.        No outpatient medications have been marked as taking for the 03/15/23 encounter (Appointment) with Nyoka Cowden, MD.                 Past Medical History:  Diagnosis Date   Back pain    Depression    Fibromyalgia    GERD (gastroesophageal reflux disease)    Hypertension    diet control   Hypothyroid    IBS (irritable bowel syndrome)    Migraines    Obesity    Osteoarthritis    Sleep apnea        Objective:     Wts  03/15/2023       ***  01/07/2023        158  12/13/2022        161  10/02/2022          148  07/20/2020        158  02/02/2020        161   02/02/20 161 lb 6.4 oz (73.2 kg)  12/16/19 176 lb (79.8 kg)  10/22/19 176 lb (79.8 kg)   Vital signs reviewed  03/15/2023  - Note at rest 02 sats  ***% on ***   General appearance:    ***       Min barrel /severly kyphotic***            CXR PA and Lateral:   12/13/2022 :    I personally reviewed images and impression is as follows:     ?  LUL atx > radiology thinks not          Assessment

## 2023-03-14 ENCOUNTER — Ambulatory Visit: Payer: Medicare Other | Admitting: Cardiology

## 2023-03-15 ENCOUNTER — Ambulatory Visit: Payer: 59 | Admitting: Internal Medicine

## 2023-03-20 ENCOUNTER — Telehealth: Payer: Self-pay

## 2023-03-20 MED ORDER — OXYCODONE HCL 15 MG PO TABS: ORAL_TABLET | ORAL | 0 refills | Status: DC

## 2023-03-20 NOTE — Telephone Encounter (Signed)
Oxycodone 15 MG Immediate Release Tablets  Qty 145 Tablets  PATIENT USES [facility]

## 2023-04-07 NOTE — Progress Notes (Deleted)
 [name], female    DOB: 1960/04/30,    MRN: [id number]  Brief patient profile:  1 yowf active smoker with clinical dx of copd previously followed by Dr Vonzell referred to pulmonary clinic in [facility]  10/22/2019 by Landry Credit NP but did not meet criteria for copd in 12/2019    History of Present Illness / maint on ? elipta ?  10/22/2019  Pulmonary/ 1st office eval/[name]  Dyspnea: limited by feet /legs hurt x  years and uses either cane or walker  Cough: worse x 6-8 m esp when try to lie down > slt yellow mucus also in am  so usually sits up but also back prevents lying down flat  Sleep: not well due mostly due to pain  SABA use: none now, has albuterol   02 prn up to 2lpm  rec Continue your inhaler and work on hard to reduce if not eliminate all smoking  zpak should change the mucus back to clear Call us  with the name of your inhaler       07/20/2020  f/u ov/[place] office/[name] re:  GOLD 0 still smoking/ no maint rx   Chief Complaint  Patient presents with   Follow-up    Sinus drainage, clear in color for past year  Dyspnea:  Rides scooter when does [employer] - limited by legs > sob Cough: smoker's rattle, clear mucus  Sleeping: sits up in chair to sleep x years  SABA use: total no more than  6 pffs per day, no maint rx  02: 2lpm hs / prn daytime  Covid status: vax x 2  Last one 11/2019   Rec Ent eval > not done    01/07/2023  f/u ov/[place] office/[name] re: GOLD 0/AB maint on dulera  did  bring inhalers  ? LUL ATX on cxr  Chief Complaint  Patient presents with   COPD    Gold  Not able to figure out how to use her humidity for her home concentrator  New L ant and post cp x sev weeks  Dyspnea:  walks with rollator  Cough: green stuff worse at hs and in am x months not better with augmentin  Sleeping: in sitting positoin always otherwise can't breath  SABA use: every 4-6 hours sometimes helps  02: sleeps 2lpm and prn daytime  Rec Plan A = Automatic =  Always=    DULERA 100  Take 30- 60 min before your first and last meals of the day  Work on inhaler technique: Plan B = Backup (to supplement plan A, not to replace it) Only use your albuterol  inhaler as a rescue medication Plan C = Crisis (instead of Plan B but only if Plan B stops working) - only use your albuterol  nebulizer if you first try Plan B  My office will be contacting you by phone for referral to CT chest and sinus ASAP> not done as of 04/09/2023    If getting worse on above ABC plan go to ER there is nothing else we can do for you thru this office        04/09/2023  f/u ov/ office/[name] re: GOLD 0 /AB maint on ***  ct sinus / chest ***  No chief complaint on file.   Dyspnea:  *** Cough: *** Sleeping: ***   resp cc  SABA use: *** 02: ***  Lung cancer screening: ***   No obvious day to day or daytime variability or assoc excess/ purulent sputum or mucus plugs or  hemoptysis or cp or chest tightness, subjective wheeze or overt sinus or hb symptoms.    Also denies any obvious fluctuation of symptoms with weather or environmental changes or other aggravating or alleviating factors except as outlined above   No unusual exposure hx or h/o childhood pna/ asthma or knowledge of premature birth.  Current Allergies, Complete Past Medical History, Past Surgical History, Family History, and Social History were reviewed in [employer] record.  ROS  The following are not active complaints unless bolded Hoarseness, sore throat, dysphagia, dental problems, itching, sneezing,  nasal congestion or discharge of excess mucus or purulent secretions, ear ache,   fever, chills, sweats, unintended wt loss or wt gain, classically pleuritic or exertional cp,  orthopnea pnd or arm/hand swelling  or leg swelling, presyncope, palpitations, abdominal pain, anorexia, nausea, vomiting, diarrhea  or change in bowel habits or change in bladder habits, change in stools or change  in urine, dysuria, hematuria,  rash, arthralgias, visual complaints, headache, numbness, weakness or ataxia or problems with walking or coordination,  change in mood or  memory.        No outpatient medications have been marked as taking for the 04/09/23 encounter (Appointment) with Brandi Ozell NOVAK, MD.                 Past Medical History:  Diagnosis Date   Back pain    Depression    Fibromyalgia    GERD (gastroesophageal reflux disease)    Hypertension    diet control   Hypothyroid    IBS (irritable bowel syndrome)    Migraines    Obesity    Osteoarthritis    Sleep apnea        Objective:     Wts  04/09/2023       ***  01/07/2023        158  12/13/2022        161  10/02/2022          148  07/20/2020        158  02/02/2020        161   02/02/20 161 lb 6.4 oz (73.2 kg)  12/16/19 176 lb (79.8 kg)  10/22/19 176 lb (79.8 kg)   Vital signs reviewed  04/09/2023  - Note at rest 02 sats  ***% on ***   General appearance:    ***       Min barrel /severly kyphotic***            CXR PA and Lateral:   12/13/2022 :    I personally reviewed images and impression is as follows:     ?  LUL atx > radiology thinks not          Assessment

## 2023-04-08 ENCOUNTER — Telehealth: Payer: Self-pay

## 2023-04-08 NOTE — Telephone Encounter (Signed)
 Oxycodone  15 MG IMMEDIATE RELEASE TABLET   QTY 145 Tablets  PATIENT USES [facility]

## 2023-04-09 ENCOUNTER — Ambulatory Visit: Payer: 59 | Admitting: Internal Medicine

## 2023-04-15 ENCOUNTER — Telehealth: Payer: Self-pay

## 2023-04-15 NOTE — Telephone Encounter (Signed)
Oxycodone-15 MG Immediate Release Tablet  Qty 145 Tablets  PATIENT USES [facility]

## 2023-04-17 ENCOUNTER — Ambulatory Visit: Payer: 59 | Admitting: Orthopaedic Surgery

## 2023-04-18 ENCOUNTER — Ambulatory Visit: Payer: 59 | Admitting: Nurse Practitioner

## 2023-04-18 NOTE — Progress Notes (Deleted)
Office Visit    Patient Name: [name] Date of Encounter: 01/15/2023 PCP:  Shelby Dubin, FNP [facility]  Cardiologist:  Dina Rich, MD  Advanced Practice Provider:  Sharlene Dory, NP Electrophysiologist:  None   Chief Complaint and HPI    [name] is a 63 y.o. female with a hx of chest pain, HTN, hypothyroidism, chronic pain, fibromyalgia, migraines, T2DM, OSA, COPD, obesity, depression, tobacco abuse, and leg edema, who presents today for 6-8 week follow-up for palpitations.  [name] was previously arranged in 2018 for chest pain evaluation, patient no showed. Echocardiogram in 2020 revealed normal EF.  Was to have repeat NST in 2020, had episode of SHOB/syncope with wearing facemask, was evaluated in ED, workup negative for ACS. Pt refused to reschedule NST. Unable to tolerate Imdur.   Last seen by Dr. Dina Rich on December 29, 2020. Was doing well at the time.   09/06/2022 - Seen for overdue 1 year follow-up. Admitted to chronic pain and arthritis symptoms, said, "I am in physical pain every day of my life." Noticed intermittent chest pain, described as shooting sensation, occurred at night, random in occurrence and denied any exertional symptoms, left arm numbness. Shortness of breath was chronic and stable. Denied any palpitations, syncope, presyncope, dizziness, orthopnea, PND, swelling or significant weight changes, acute bleeding, or claudication.   She contacted our office in August 2024 noting intermittent palpitations x 3-4 weeks, lasting longer over time, also noted chest tightness, SOB, and lightheadedness, and presyncope with these episodes. Zio XT monitor was ordered and arranged for further evaluation- see report below.   Today she presents for palpitations follow-up.  Patient says that when she wore her monitor she did not experience any palpitations at the time.  Describes her palpitations as being rare in occurrence,  only occurring every couple of weeks and lasting about 30 minutes to couple hours in duration. Continues to note chronic pain as well as, stable and chronic intermittent chest pain, described as shooting sensation, occurred at night, random in occurrence (similar to previous office visit) and denies any exertional symptoms. Shortness of breath continues to be chronic and stable. Denies any syncope, presyncope, dizziness, orthopnea, PND, significant weight changes, acute bleeding, or claudication.  Her leg swelling is chronic and stable per her report, she wraps her legs herself due to leg weeping.  Smoking 2 pack/day.   SH: Used to work in [employer].   EKGs/Labs/Other Studies Reviewed:   The following studies were reviewed today:  EKG:  EKG is not ordered today.    Preliminary cardiac monitor report 12/2022: Patient had a min HR of 49 bpm, max HR of 169 bpm, and avg HR of 75 bpm. Predominant underlying rhythm was Sinus Rhythm. 1 run of Ventricular Tachycardia occurred lasting 4 beats with a max rate of 164 bpm (avg 125 bpm). 36 Supraventricular Tachycardia  runs occurred, the run with the fastest interval lasting 5 beats with a max rate of 169 bpm, the longest lasting 12 beats with an avg rate of 127 bpm. Isolated SVEs were rare (<1.0%), SVE Couplets were rare (<1.0%), and SVE Triplets were rare (<1.0%).  Isolated VEs were rare (<1.0%, 7375), VE Couplets were rare (<1.0%, 41), and VE Triplets were rare (<1.0%, 1). Ventricular Trigeminy was present.   Echo 09/2018: 1. The left ventricle has normal systolic function with an ejection  fraction of 60-65%. The cavity size was normal. Left ventricular diastolic  parameters were normal. No  evidence of left ventricular regional wall  motion abnormalities.   2. The right ventricle has normal systolic function. The cavity was  normal. There is mildly increased right ventricular wall thickness.   3. Left atrial size was mildly dilated.   4. The mitral  valve is grossly normal. There is mild mitral annular  calcification present.   5. The tricuspid valve is grossly normal.   6. The aortic valve is grossly normal.   7. The aortic root is normal in size and structure.   8. The inferior vena cava was dilated in size with <50% respiratory  variability.  Review of Systems    All other systems reviewed and are otherwise negative except as noted above.  Physical Exam    VS:  There were no vitals taken for this visit. , BMI There is no height or weight on file to calculate BMI.  Wt Readings from Last 3 Encounters:  01/15/23 164 lb 3.2 oz (74.5 kg)  01/07/23 158 lb (71.7 kg)  12/13/22 161 lb (73 kg)    GEN: Well nourished, well developed, in no acute distress. HEENT: normal. Neck: Supple, no JVD, carotid bruits, or masses. Cardiac: S1/S2, RRR, no murmurs, rubs, or gallops. No clubbing, cyanosis. Generalized, nonpitting edema.  Radials/PT 2+ and equal bilaterally.  Leg edema to BLE, with legs wrapped bilaterally. Respiratory:  Respirations regular and unlabored, inspiratory and expiratory wheezing to auscultation bilaterally. MS: Thoracic kyphosis and scoliosis noted, no deformity or atrophy. Skin: Pale, warm and dry, no rash. Neuro:  Strength and sensation are intact. Psych: Normal affect.  Assessment & Plan    Chest pain of uncertain etiology Etiology continues to not sound cardiac, but sounds MSK in nature, has chronic pain issues. Follows pain management clinic. Was previously arranged NST in past, continues to refuse NST as she was dropped off table during previous test. Previous EKG negative for acute ischemic changes. No indication for ischemic evaluation at this time. No medication changes at this time. ED precautions discussed. Continue to follow with PCP.  2. Palpitations Admits to rare palpitations at times, with episodes lasting 30 minutes to sometimes lasting hours in duration. Preliminary report noted above and pt was  asymptomatic while wearing the monitor. Continue Lopressor 25 mg BID, and instructed that she may take an extra 25 mg tablet daily PRN for palpitations. She verbalized understanding. Heart healthy diet encouraged.   HTN BP stable. Discussed to monitor BP at home at least 2 hours after medications and sitting for 5-10 minutes. No medication changes at this time. Heart healthy diet encouraged.   Leg edema, medication management Stable.  She has been wrapping her legs d/t issues with leg weeping. Will obtain proBNP and BMET. Continue current medication regimen.  If kidney function permits, will increase torsemide dosage or consider adding metolazone to medication regimen.  4. Shortness of breath, COPD This is chronic and stable per her report.  Echocardiogram in 2020 was unremarkable.  Dr. Wyline Mood recommended setting her up for [facility], however patient currently refuses to undergo this as she was previously dropped off the table in the past.  Patient has significant tobacco abuse that is contributing to her shortness of breath.  She has audible wheezing inspiratory and expiratory on exam today.  Continue follow-up with PCP and pulmonology.  5. Tobacco abuse Smoking cessation encouraged and discussed.   Disposition: Follow up in 3 months with Dina Rich, MD or APP.  Signed, Sharlene Dory, NP

## 2023-04-22 ENCOUNTER — Telehealth: Payer: Self-pay

## 2023-04-22 MED ORDER — OXYCODONE HCL 15 MG PO TABS: ORAL_TABLET | ORAL | 0 refills | Status: DC

## 2023-04-22 NOTE — Telephone Encounter (Signed)
Oxycodone 15 MG Immediate release tablet  Qty 145 Tablets  PATIENT USES [facility]

## 2023-05-01 ENCOUNTER — Encounter: Payer: Self-pay | Admitting: Orthopaedic Surgery

## 2023-05-01 ENCOUNTER — Ambulatory Visit: Payer: 59 | Admitting: Orthopaedic Surgery

## 2023-05-01 DIAGNOSIS — M25512 Pain in left shoulder: Secondary | ICD-10-CM | POA: Diagnosis not present

## 2023-05-01 DIAGNOSIS — F17210 Nicotine dependence, cigarettes, uncomplicated: Secondary | ICD-10-CM

## 2023-05-01 DIAGNOSIS — G894 Chronic pain syndrome: Secondary | ICD-10-CM

## 2023-05-01 DIAGNOSIS — G8929 Other chronic pain: Secondary | ICD-10-CM

## 2023-05-01 MED ORDER — METHYLPREDNISOLONE ACETATE 40 MG/ML IJ SUSP
40.0000 mg | Freq: Once | INTRAMUSCULAR | Status: AC
  Administered 2023-05-01: 40 mg via INTRA_ARTICULAR

## 2023-05-01 NOTE — Progress Notes (Signed)
PROCEDURE NOTE:  The patient request injection, verbal consent was obtained.  The left shoulder was prepped appropriately after time out was performed.   Sterile technique was observed and injection of 1 cc of DepoMedrol 40mg  with several cc's of plain xylocaine. Anesthesia was provided by ethyl chloride and a 20-gauge needle was used to inject the shoulder area. A posterior approach was used.  The injection was tolerated well.  A band aid dressing was applied.  The patient was advised to apply ice later today and tomorrow to the injection sight as needed.  Encounter Diagnoses  Name Primary?   Chronic left shoulder pain Yes   Cigarette nicotine dependence without complication    Pain syndrome, chronic    Return in ten weeks.  Call if any problem.  Precautions discussed.  Electronically Signed Darreld Mclean, MD 1/29/20251:27 PM

## 2023-05-21 ENCOUNTER — Telehealth: Payer: Self-pay

## 2023-05-21 MED ORDER — OXYCODONE HCL 15 MG PO TABS: ORAL_TABLET | ORAL | 0 refills | Status: DC

## 2023-05-21 NOTE — Telephone Encounter (Signed)
 Oxycodone 15 MG Immediate release tablet  Qty 145 Tablets  PATIENT USES [facility]

## 2023-06-17 ENCOUNTER — Telehealth: Payer: Self-pay

## 2023-06-17 NOTE — Telephone Encounter (Signed)
 Oxycodone-Acetaminophen 15 MG Immediate Release Tablet  Qty 145 Tablets    PATIENT USES [facility]

## 2023-06-18 MED ORDER — OXYCODONE HCL 15 MG PO TABS: ORAL_TABLET | ORAL | 0 refills | Status: DC

## 2023-07-10 ENCOUNTER — Ambulatory Visit: Payer: 59 | Admitting: Orthopaedic Surgery

## 2023-07-10 ENCOUNTER — Ambulatory Visit: Admitting: Orthopaedic Surgery

## 2023-07-11 ENCOUNTER — Other Ambulatory Visit: Payer: Self-pay | Admitting: Nurse Practitioner

## 2023-07-17 ENCOUNTER — Ambulatory Visit (INDEPENDENT_AMBULATORY_CARE_PROVIDER_SITE_OTHER): Admitting: Orthopaedic Surgery

## 2023-07-17 ENCOUNTER — Encounter: Payer: Self-pay | Admitting: Orthopaedic Surgery

## 2023-07-17 ENCOUNTER — Ambulatory Visit: Admitting: Orthopaedic Surgery

## 2023-07-17 DIAGNOSIS — G894 Chronic pain syndrome: Secondary | ICD-10-CM | POA: Diagnosis not present

## 2023-07-17 DIAGNOSIS — G8929 Other chronic pain: Secondary | ICD-10-CM

## 2023-07-17 DIAGNOSIS — M25512 Pain in left shoulder: Secondary | ICD-10-CM | POA: Diagnosis not present

## 2023-07-17 MED ORDER — METHYLPREDNISOLONE ACETATE 40 MG/ML IJ SUSP
40.0000 mg | Freq: Once | INTRAMUSCULAR | Status: AC
  Administered 2023-07-17: 40 mg via INTRA_ARTICULAR

## 2023-07-17 MED ORDER — OXYCODONE HCL 15 MG PO TABS: ORAL_TABLET | ORAL | 0 refills | Status: DC

## 2023-07-17 NOTE — Progress Notes (Signed)
 PROCEDURE NOTE:  The patient request injection, verbal consent was obtained.  The left shoulder was prepped appropriately after time out was performed.   Sterile technique was observed and injection of 1 cc of DepoMedrol 40mg  with several cc's of plain xylocaine. Anesthesia was provided by ethyl chloride and a 20-gauge needle was used to inject the shoulder area. A posterior approach was used.  The injection was tolerated well.  A band aid dressing was applied.  The patient was advised to apply ice later today and tomorrow to the injection sight as needed.  Encounter Diagnoses  Name Primary?   Chronic left shoulder pain Yes   Pain syndrome, chronic    Return in two months.  I have reviewed the [place]  Controlled Substance Reporting System web site prior to prescribing narcotic medicine for this patient.  Call if any problem.  Precautions discussed.  Electronically Signed Pleasant Brilliant, MD 4/16/20252:24 PM

## 2023-07-17 NOTE — Addendum Note (Signed)
 Addended by: Maryland Snow T on: 07/17/2023 03:21 PM   Modules accepted: Orders

## 2023-08-08 ENCOUNTER — Other Ambulatory Visit: Payer: Self-pay | Admitting: Nurse Practitioner

## 2023-08-14 ENCOUNTER — Telehealth: Payer: Self-pay

## 2023-08-14 MED ORDER — OXYCODONE HCL 15 MG PO TABS: ORAL_TABLET | ORAL | 0 refills | Status: DC

## 2023-08-14 NOTE — Telephone Encounter (Signed)
 Oxycodone -Acetaminophen  15 MG Immediate Release   Qty 145 Tablets  PATIENT USES [facility]

## 2023-08-20 ENCOUNTER — Other Ambulatory Visit: Payer: Self-pay | Admitting: Nurse Practitioner

## 2023-09-09 ENCOUNTER — Other Ambulatory Visit: Payer: Self-pay | Admitting: Cardiology

## 2023-09-11 ENCOUNTER — Telehealth: Payer: Self-pay | Admitting: Orthopaedic Surgery

## 2023-09-11 MED ORDER — OXYCODONE HCL 15 MG PO TABS: ORAL_TABLET | ORAL | 0 refills | Status: DC

## 2023-09-11 NOTE — Telephone Encounter (Signed)
 Dr. Vicente Graham pt - spoke w/the pt, she is requesting a refill for Oxycodone  15mg  to be sent to [facility].

## 2023-09-19 ENCOUNTER — Encounter: Payer: Self-pay | Admitting: Orthopaedic Surgery

## 2023-09-19 ENCOUNTER — Ambulatory Visit: Admitting: Orthopaedic Surgery

## 2023-09-19 DIAGNOSIS — M25512 Pain in left shoulder: Secondary | ICD-10-CM | POA: Diagnosis not present

## 2023-09-19 DIAGNOSIS — G8929 Other chronic pain: Secondary | ICD-10-CM

## 2023-09-19 DIAGNOSIS — F17210 Nicotine dependence, cigarettes, uncomplicated: Secondary | ICD-10-CM

## 2023-09-19 DIAGNOSIS — G894 Chronic pain syndrome: Secondary | ICD-10-CM

## 2023-09-19 NOTE — Progress Notes (Signed)
 PROCEDURE NOTE:  The patient request injection, verbal consent was obtained.  The left shoulder was prepped appropriately after time out was performed.   Sterile technique was observed and injection of 1 cc of DepoMedrol 40mg  with several cc's of plain xylocaine . Anesthesia was provided by ethyl chloride and a 20-gauge needle was used to inject the shoulder area. A posterior approach was used.  The injection was tolerated well.  A band aid dressing was applied.  The patient was advised to apply ice later today and tomorrow to the injection sight as needed.  Encounter Diagnoses  Name Primary?   Chronic left shoulder pain Yes   Pain syndrome, chronic    Cigarette nicotine dependence without complication    Return in two months.  Continue medication.  Call if any problem.  Precautions discussed.  Electronically Signed Pleasant Brilliant, MD 6/19/20259:48 AM

## 2023-10-08 ENCOUNTER — Telehealth: Payer: Self-pay | Admitting: Orthopaedic Surgery

## 2023-10-08 NOTE — Telephone Encounter (Signed)
 Dr. Vicente Graham pt - spoke w/the pt, she is requesting a refill for Oxycodone  15mg  to be sent to [facility].

## 2023-10-09 ENCOUNTER — Other Ambulatory Visit: Payer: Self-pay | Admitting: Nurse Practitioner

## 2023-10-09 MED ORDER — OXYCODONE HCL 15 MG PO TABS: ORAL_TABLET | ORAL | 0 refills | Status: DC

## 2023-10-22 ENCOUNTER — Other Ambulatory Visit: Payer: Self-pay

## 2023-10-22 MED ORDER — METOPROLOL TARTRATE 25 MG PO TABS: ORAL_TABLET | ORAL | 0 refills | Status: AC

## 2023-11-05 ENCOUNTER — Telehealth: Payer: Self-pay | Admitting: Orthopaedic Surgery

## 2023-11-05 NOTE — Telephone Encounter (Signed)
 Returned the pt's call, lvm for her to cb.

## 2023-11-05 NOTE — Telephone Encounter (Signed)
 Dr. Vicente Graham pt - spoke w/the pt, she is requesting a refill for Oxycodone  15mg  to be sent to [facility].

## 2023-11-06 NOTE — Addendum Note (Signed)
 Addended by: MARCINE HUSBAND T on: 11/06/2023 03:16 PM   Modules accepted: Orders

## 2023-11-07 MED ORDER — OXYCODONE HCL 15 MG PO TABS: ORAL_TABLET | ORAL | 0 refills | Status: DC

## 2023-11-07 NOTE — Addendum Note (Signed)
 Addended by: BRENNA NORLEEN ORN on: 11/07/2023 09:59 AM   Modules accepted: Orders

## 2023-11-21 ENCOUNTER — Encounter: Payer: Self-pay | Admitting: Orthopaedic Surgery

## 2023-11-21 ENCOUNTER — Ambulatory Visit: Admitting: Orthopaedic Surgery

## 2023-11-21 DIAGNOSIS — G8929 Other chronic pain: Secondary | ICD-10-CM

## 2023-11-21 DIAGNOSIS — G894 Chronic pain syndrome: Secondary | ICD-10-CM | POA: Diagnosis not present

## 2023-11-21 DIAGNOSIS — M25512 Pain in left shoulder: Secondary | ICD-10-CM | POA: Diagnosis not present

## 2023-11-21 DIAGNOSIS — F17210 Nicotine dependence, cigarettes, uncomplicated: Secondary | ICD-10-CM

## 2023-11-21 NOTE — Progress Notes (Signed)
 PROCEDURE NOTE:  The patient request injection, verbal consent was obtained.  The left shoulder was prepped appropriately after time out was performed.   Sterile technique was observed and injection of 1 cc of DepoMedrol 40mg  with several cc's of plain xylocaine . Anesthesia was provided by ethyl chloride and a 20-gauge needle was used to inject the shoulder area. A posterior approach was used.  The injection was tolerated well.  A band aid dressing was applied.  The patient was advised to apply ice later today and tomorrow to the injection sight as needed.  Encounter Diagnoses  Name Primary?   Chronic left shoulder pain Yes   Pain syndrome, chronic    Cigarette nicotine dependence without complication    I have informed the patient I will be retiring from medical practice and from this office on January 02, 2024.  The patient has been offered continuing care with Dr. Margrette or Dr. Onesimo of this office.  The patient may choose another provider and the records will be forwarded after proper signature and notification.  Patient understands and agrees.  She has elected to see Dr. Onesimo in two months.  Call if any problem.  Precautions discussed.  Electronically Signed Lemond Stable, MD 8/21/202511:31 AM

## 2023-12-04 ENCOUNTER — Telehealth: Payer: Self-pay | Admitting: Orthopaedic Surgery

## 2023-12-04 MED ORDER — OXYCODONE HCL 15 MG PO TABS: ORAL_TABLET | ORAL | 0 refills | Status: DC

## 2023-12-04 NOTE — Telephone Encounter (Signed)
 Dr. Janae pt - spoke w/the pt, she is requesting a refill for Oxycodone  15mg  to be sent [facility]

## 2024-01-02 ENCOUNTER — Ambulatory Visit (INDEPENDENT_AMBULATORY_CARE_PROVIDER_SITE_OTHER): Admitting: Orthopaedic Surgery

## 2024-01-02 ENCOUNTER — Encounter: Payer: Self-pay | Admitting: Orthopaedic Surgery

## 2024-01-02 DIAGNOSIS — G894 Chronic pain syndrome: Secondary | ICD-10-CM

## 2024-01-02 DIAGNOSIS — M25512 Pain in left shoulder: Secondary | ICD-10-CM | POA: Diagnosis not present

## 2024-01-02 DIAGNOSIS — G8929 Other chronic pain: Secondary | ICD-10-CM

## 2024-01-02 DIAGNOSIS — R29898 Other symptoms and signs involving the musculoskeletal system: Secondary | ICD-10-CM

## 2024-01-02 DIAGNOSIS — F17210 Nicotine dependence, cigarettes, uncomplicated: Secondary | ICD-10-CM

## 2024-01-02 MED ORDER — OXYCODONE HCL 15 MG PO TABS: ORAL_TABLET | ORAL | 0 refills | Status: DC

## 2024-01-02 NOTE — Progress Notes (Signed)
 PROCEDURE NOTE:  The patient request injection, verbal consent was obtained.  The left shoulder was prepped appropriately after time out was performed.   Sterile technique was observed and injection of 1 cc of DepoMedrol 40mg  with several cc's of plain xylocaine . Anesthesia was provided by ethyl chloride and a 20-gauge needle was used to inject the shoulder area. A posterior approach was used.  The injection was tolerated well.  A band aid dressing was applied.  The patient was advised to apply ice later today and tomorrow to the injection sight as needed.  Encounter Diagnoses  Name Primary?   Chronic left shoulder pain Yes   Pain syndrome, chronic    Cigarette nicotine dependence without complication    Leg weakness, bilateral    I have informed the patient I will be retiring from medical practice and from this office on January 02, 2024, today.  The patient has been offered continuing care with Dr. Margrette or Dr. Onesimo of this office.  The patient may choose another provider and the records will be forwarded after proper signature and notification.  Patient understands and agrees.  She would like to see Dr. Onesimo.  Call if any problem.  Precautions discussed.  Electronically Signed Lemond Stable, MD 10/2/20259:43 AM

## 2024-01-15 ENCOUNTER — Other Ambulatory Visit: Payer: Self-pay | Admitting: Nurse Practitioner

## 2024-01-21 ENCOUNTER — Ambulatory Visit: Admitting: Orthopedic Surgery

## 2024-01-21 DIAGNOSIS — N3946 Mixed incontinence: Secondary | ICD-10-CM | POA: Insufficient documentation

## 2024-01-21 DIAGNOSIS — D709 Neutropenia, unspecified: Secondary | ICD-10-CM | POA: Insufficient documentation

## 2024-01-21 DIAGNOSIS — D72819 Decreased white blood cell count, unspecified: Secondary | ICD-10-CM | POA: Insufficient documentation

## 2024-01-21 DIAGNOSIS — K259 Gastric ulcer, unspecified as acute or chronic, without hemorrhage or perforation: Secondary | ICD-10-CM | POA: Insufficient documentation

## 2024-01-21 DIAGNOSIS — R0600 Dyspnea, unspecified: Secondary | ICD-10-CM | POA: Insufficient documentation

## 2024-01-21 DIAGNOSIS — L97911 Non-pressure chronic ulcer of unspecified part of right lower leg limited to breakdown of skin: Secondary | ICD-10-CM | POA: Insufficient documentation

## 2024-01-21 DIAGNOSIS — L89312 Pressure ulcer of right buttock, stage 2: Secondary | ICD-10-CM | POA: Insufficient documentation

## 2024-01-21 DIAGNOSIS — Z720 Tobacco use: Secondary | ICD-10-CM | POA: Insufficient documentation

## 2024-01-21 DIAGNOSIS — N189 Chronic kidney disease, unspecified: Secondary | ICD-10-CM | POA: Insufficient documentation

## 2024-01-21 DIAGNOSIS — R600 Localized edema: Secondary | ICD-10-CM | POA: Insufficient documentation

## 2024-01-21 DIAGNOSIS — M438X9 Other specified deforming dorsopathies, site unspecified: Secondary | ICD-10-CM | POA: Insufficient documentation

## 2024-01-21 DIAGNOSIS — Z7409 Other reduced mobility: Secondary | ICD-10-CM | POA: Insufficient documentation

## 2024-01-21 DIAGNOSIS — F332 Major depressive disorder, recurrent severe without psychotic features: Secondary | ICD-10-CM | POA: Insufficient documentation

## 2024-01-21 DIAGNOSIS — F419 Anxiety disorder, unspecified: Secondary | ICD-10-CM | POA: Insufficient documentation

## 2024-01-21 DIAGNOSIS — C50919 Malignant neoplasm of unspecified site of unspecified female breast: Secondary | ICD-10-CM | POA: Insufficient documentation

## 2024-01-21 DIAGNOSIS — E8809 Other disorders of plasma-protein metabolism, not elsewhere classified: Secondary | ICD-10-CM | POA: Insufficient documentation

## 2024-01-21 DIAGNOSIS — E785 Hyperlipidemia, unspecified: Secondary | ICD-10-CM | POA: Insufficient documentation

## 2024-01-21 DIAGNOSIS — G4700 Insomnia, unspecified: Secondary | ICD-10-CM | POA: Insufficient documentation

## 2024-01-21 DIAGNOSIS — E876 Hypokalemia: Secondary | ICD-10-CM | POA: Insufficient documentation

## 2024-01-21 DIAGNOSIS — E559 Vitamin D deficiency, unspecified: Secondary | ICD-10-CM | POA: Insufficient documentation

## 2024-01-21 DIAGNOSIS — I509 Heart failure, unspecified: Secondary | ICD-10-CM | POA: Insufficient documentation

## 2024-01-21 DIAGNOSIS — F329 Major depressive disorder, single episode, unspecified: Secondary | ICD-10-CM | POA: Insufficient documentation

## 2024-01-21 DIAGNOSIS — I10 Essential (primary) hypertension: Secondary | ICD-10-CM | POA: Insufficient documentation

## 2024-02-04 ENCOUNTER — Encounter: Payer: Self-pay | Admitting: Orthopedic Surgery

## 2024-02-04 ENCOUNTER — Ambulatory Visit (INDEPENDENT_AMBULATORY_CARE_PROVIDER_SITE_OTHER): Admitting: Orthopedic Surgery

## 2024-02-04 ENCOUNTER — Other Ambulatory Visit: Payer: Self-pay | Admitting: Nurse Practitioner

## 2024-02-04 ENCOUNTER — Other Ambulatory Visit (INDEPENDENT_AMBULATORY_CARE_PROVIDER_SITE_OTHER): Payer: Self-pay

## 2024-02-04 VITALS — BP 147/88 | HR 78 | Ht 60.0 in | Wt 154.0 lb

## 2024-02-04 DIAGNOSIS — G8929 Other chronic pain: Secondary | ICD-10-CM | POA: Diagnosis not present

## 2024-02-04 DIAGNOSIS — G894 Chronic pain syndrome: Secondary | ICD-10-CM

## 2024-02-04 DIAGNOSIS — M25512 Pain in left shoulder: Secondary | ICD-10-CM

## 2024-02-04 MED ORDER — OXYCODONE HCL 15 MG PO TABS: ORAL_TABLET | ORAL | 0 refills | Status: AC

## 2024-02-04 NOTE — Patient Instructions (Signed)

## 2024-02-04 NOTE — Progress Notes (Signed)
 New Patient Visit  Assessment: [name] is a 63 y.o. female with the following: 1. Chronic left shoulder pain   Plan: [name] has chronic left shoulder pain.  There does appear to be proximal humeral migration on x-rays obtained in clinic today.  She has limited forward flexion.  She has been getting injections in the shoulder, and I have offered her an injection today.  She elected to proceed.  Regarding her chronic pain medications.  She was advised that I would not continue to provide this medication.  These doses are very high.  I am not comfortable providing this medication long-term.  I will provide her with prescriptions on a weekly basis until she is able to see pain management.  Procedure note injection Left shoulder    Verbal consent was obtained to inject the left shoulder, subacromial space Timeout was completed to confirm the site of injection.  The skin was prepped with alcohol and ethyl chloride was sprayed at the injection site.  A 21-gauge needle was used to inject 40 mg of Depo-Medrol  and 1% lidocaine  (4 cc) into the subacromial space of the left shoulder using a posterolateral approach.  There were no complications. A sterile bandage was applied.   Follow-up: Return if symptoms worsen or fail to improve.  Subjective:  Chief Complaint  Patient presents with   Pain    L shoulder     History of Present Illness: [name] is a 63 y.o. female who presents for evaluation of chronic left shoulder pain.  She is a longtime patient of Dr. Brenna.  She has had multiple surgeries on her back.  She is a chronic pain patient.  Injections have been helpful.  She states they helped for at least a month.  She is currently taking very high doses of oxycodone , several times per day.  Dr. Brenna has been providing this medication for her for years.   Review of Systems: No fevers or chills No numbness or tingling No chest pain No shortness of breath No  bowel or bladder dysfunction No GI distress No headaches   Medical History:  Past Medical History:  Diagnosis Date   Back pain    Depression    Fibromyalgia    GERD (gastroesophageal reflux disease)    Hypertension    diet control   Hypothyroid    IBS (irritable bowel syndrome)    Migraines    Obesity    Osteoarthritis    Sleep apnea     Past Surgical History:  Procedure Laterality Date   AMPUTATION  12/13/2011   Procedure: AMPUTATION DIGIT;  Surgeon: Morene Donley Anon, DPM;  Location: [facility];  Service: Orthopedics;  Laterality: Right;  amputation second toe right foot   BACK SURGERY     BIOPSY  12/23/2015   Procedure: BIOPSY;  Surgeon: Claudis RAYMOND Rivet, MD;  Location: [facility];  Service: Endoscopy;;  pre pyloric patches   BREAST BIOPSY     CHOLECYSTECTOMY     CYSTOSCOPY W/ URETERAL STENT PLACEMENT Left 10/09/2017   Procedure: CYSTOSCOPY WITH RETROGRADE PYELOGRAM/URETERAL STENT PLACEMENT;  Surgeon: Sherrilee Belvie CROME, MD;  Location: [facility];  Service: Urology;  Laterality: Left;   CYSTOSCOPY WITH RETROGRADE PYELOGRAM, URETEROSCOPY AND STENT PLACEMENT Left 10/23/2017   Procedure: CYSTOSCOPY WITH LEFT RETROGRADE PYELOGRAM, LEFT URETEROSCOPY AND STENT EXCHANGE;  Surgeon: Sherrilee Belvie CROME, MD;  Location: [facility];  Service: Urology;  Laterality: Left;   ESOPHAGOGASTRODUODENOSCOPY  05/28/2011   Procedure: ESOPHAGOGASTRODUODENOSCOPY (EGD);  Surgeon: Claudis RAYMOND Rivet, MD;  Location: [facility];  Service: Endoscopy;  Laterality: N/A;  730   ESOPHAGOGASTRODUODENOSCOPY N/A 10/09/2013   Procedure: ESOPHAGOGASTRODUODENOSCOPY (EGD);  Surgeon: Claudis RAYMOND Rivet, MD;  Location: [facility];  Service: Endoscopy;  Laterality: N/A;  730   ESOPHAGOGASTRODUODENOSCOPY (EGD) WITH PROPOFOL  N/A 12/23/2015   Procedure: ESOPHAGOGASTRODUODENOSCOPY (EGD) WITH PROPOFOL ;  Surgeon: Claudis RAYMOND Rivet, MD;  Location: [facility];  Service: Endoscopy;  Laterality: N/A;   HERNIA REPAIR     HOLMIUM LASER  APPLICATION Left 10/23/2017   Procedure: LEFT URETEROSCOPY WITH HOLMIUM LASER LITHOTRIPSY;  Surgeon: Sherrilee Belvie CROME, MD;  Location: [facility];  Service: Urology;  Laterality: Left;   MALONEY DILATION N/A 10/09/2013   Procedure: AGAPITO DILATION;  Surgeon: Claudis RAYMOND Rivet, MD;  Location: [facility];  Service: Endoscopy;  Laterality: N/A;   NASAL SEPTUM SURGERY     NECK SURGERY  1998, 2008   2 yrs ago   TOTAL ABDOMINAL HYSTERECTOMY     Left oophroectomy for a lare tumor about 21 yrs. RT ovary removed time of hysterectomy    Family History  Problem Relation Age of Onset   CAD Mother    Cancer Mother    Obesity Brother    Cancer Sister    Liver disease Brother    Hypertension Brother    Social History   Tobacco Use   Smoking status: Every Day    Current packs/day: 0.25    Average packs/day: 0.3 packs/day for 35.0 years (8.8 ttl pk-yrs)    Types: Cigarettes   Smokeless tobacco: Never   Tobacco comments:    2 cigarettes daily one in the morning and one in the evening  Vaping Use   Vaping status: Never Used  Substance Use Topics   Alcohol use: No   Drug use: No    Allergies  Allergen Reactions   Cephalexin Hives    unknown   Latex Rash   Betadine [Povidone Iodine] Rash   Cleocin  [Clindamycin  Hcl] Hives   Gentamicin     Other     Band aides - rash    Sulfa Antibiotics     Vomiting    Toradol  [Ketorolac  Tromethamine ] Nausea And Vomiting    migraine    Current Meds  Medication Sig   albuterol  (PROVENTIL ) (2.5 MG/3ML) 0.083% nebulizer solution Take 3 mLs (2.5 mg total) by nebulization every 6 (six) hours as needed for wheezing or shortness of breath.   beta carotene w/minerals (OCUVITE) tablet Take 1 tablet by mouth daily.   budesonide -formoterol  (SYMBICORT ) 80-4.5 MCG/ACT inhaler Take 2 puffs first thing in am and then another 2 puffs about 12 hours later.   diclofenac  Sodium (VOLTAREN ) 1 % GEL apply FOUR grams TO affected joints AS NEEDED FOUR TIMES DAILY    Docusate Sodium 100 MG capsule Take 100 mg by mouth 2 (two) times daily.   DULoxetine (CYMBALTA) 60 MG capsule Take 60 mg by mouth daily.   esomeprazole (NEXIUM) 40 MG capsule Take 1 capsule (40 mg total) by mouth daily before breakfast.   famotidine  (PEPCID ) 20 MG tablet One after supper   hydrOXYzine (VISTARIL) 25 MG capsule Take 25 mg by mouth every 6 (six) hours as needed for anxiety.    ipratropium-albuterol  (DUONEB) 0.5-2.5 (3) MG/3ML SOLN SMARTSIG:3 Milliliter(s) Via Nebulizer Every 6 Hours   ketoconazole (NIZORAL) 2 % cream Apply 1 Application topically 2 (two) times daily.   levothyroxine (SYNTHROID) 125 MCG tablet Take 125 mcg by mouth daily.  linaclotide (LINZESS) 290 MCG CAPS capsule Take 290 mcg by mouth daily before breakfast.   metoprolol  tartrate (LOPRESSOR ) 25 MG tablet TAKE 1 TABLET BY MOUTH TWICE DAILY with 1 extra 25 Mg tablet as needed for palpitations   mometasone -formoterol  (DULERA) 100-5 MCG/ACT AERO Inhale 2 puffs into the lungs in the morning and at bedtime.   Multiple Vitamins-Iron (MULTIVITAMINS WITH IRON) TABS Take 1 tablet by mouth daily.   omeprazole (PRILOSEC) 40 MG capsule Take 40 mg by mouth daily.   ondansetron  (ZOFRAN -ODT) 4 MG disintegrating tablet Take 4 mg by mouth daily as needed.   oxyCODONE  (ROXICODONE ) 15 MG immediate release tablet TAKE 1 TABLET BY MOUTH 5 times DAILY AS NEEDED FOR pain   polyethylene glycol (MIRALAX / GLYCOLAX) packet Take 17 g by mouth 2 (two) times daily before a meal.   potassium chloride  (MICRO-K ) 10 MEQ CR capsule Take 4 capsules (40 mEq total) by mouth daily.   pregabalin (LYRICA) 100 MG capsule Take 100 mg by mouth 2 (two) times daily.   Prucalopride Succinate  (MOTEGRITY ) 2 MG TABS Take 2 mg by mouth daily.   silver sulfADIAZINE (SILVADENE) 1 % cream APPLY TO THE AFFECTED AREA(S) TWICE DAILY UNTIL HEALED   spironolactone  (ALDACTONE ) 25 MG tablet TAKE 1/2 TABLET BY MOUTH DAILY - MUST SCHEDULE APPOINTMENT FOR MORE REFILLS    sucralfate  (CARAFATE ) 1 g tablet Take 1 g by mouth 4 (four) times daily.   SUMAtriptan (IMITREX) 100 MG tablet TAKE 1 TABLET BY MOUTH AT ONSET OF HEADACHE. MAY REPEAT ONCE IN TWO HOURS. NO MORE THAN TWO TABLETS IN 24 HOURS   tiZANidine  (ZANAFLEX ) 4 MG tablet Take 4 mg by mouth 3 (three) times daily as needed.   topiramate (TOPAMAX) 50 MG tablet Take 50 mg by mouth 2 (two) times daily.   torsemide  (DEMADEX ) 20 MG tablet TAKE 4 TABLETS BY MOUTH DAILY (PATIENT NEEDS APPOINTMENT)   traZODone (DESYREL) 50 MG tablet Take 50 mg by mouth at bedtime.   triamcinolone lotion (KENALOG) 0.1 % Apply 1 Application topically 2 (two) times daily.   VENTOLIN  HFA 108 (90 Base) MCG/ACT inhaler Inhale 1-2 puffs into the lungs every 6 (six) hours as needed for wheezing or shortness of breath.    Vitamin D, Ergocalciferol, (DRISDOL) 1.25 MG (50000 UNIT) CAPS capsule Take 50,000 Units by mouth once a week.    Objective: BP (!) 147/88   Pulse 78   Ht 5' (1.524 m)   Wt 154 lb (69.9 kg)   BMI 30.08 kg/m   Physical Exam:  General: Alert and oriented. and No acute distress. Gait: Ambulates with the assistance of a walker.  Left shoulder without deformity.  Forward flexion limited to 90 degrees.  Sensation intact in the axillary nerve distribution.  Fingers are warm and well-perfused.  She is able to make a full fist.  She has kyphosis of the upper thoracic spine.  IMAGING: I personally ordered and reviewed the following images  X-rays of the left shoulder were obtained in clinic today.  No acute injuries are noted.  Well-maintained glenohumeral joint space.  There is some evidence of proximal humeral migration.  No osteophytes.  No bony lesions.  Impression: Left shoulder x-rays with evidence of chronic rotator cuff injury   New Medications:  Meds ordered this encounter  Medications   oxyCODONE  (ROXICODONE ) 15 MG immediate release tablet    Sig: TAKE 1 TABLET BY MOUTH 5 times DAILY AS NEEDED FOR pain     Dispense:  30 tablet  Refill:  0      Oneil DELENA Horde, MD  02/04/2024 11:29 AM

## 2024-02-04 NOTE — Addendum Note (Signed)
 Addended by: VICENTA EMMIE HERO on: 02/04/2024 11:43 AM   Modules accepted: Orders

## 2024-03-16 ENCOUNTER — Other Ambulatory Visit: Payer: Self-pay | Admitting: Cardiology

## 2024-03-20 ENCOUNTER — Other Ambulatory Visit: Payer: Self-pay

## 2024-03-20 MED ORDER — TORSEMIDE 20 MG PO TABS: 80.0000 mg | ORAL_TABLET | Freq: Every day | ORAL | 0 refills | Status: AC
